# Patient Record
Sex: Male | Born: 1980 | Race: Black or African American | Hispanic: No | Marital: Married | Smoking: Current every day smoker
Health system: Southern US, Community
[De-identification: ages and names within clinical notes are randomized; demographics above are authoritative.]

## PROBLEM LIST (undated history)

## (undated) DIAGNOSIS — F1210 Cannabis abuse, uncomplicated: Secondary | ICD-10-CM

## (undated) DIAGNOSIS — R112 Nausea with vomiting, unspecified: Secondary | ICD-10-CM

## (undated) DIAGNOSIS — I2510 Atherosclerotic heart disease of native coronary artery without angina pectoris: Secondary | ICD-10-CM

## (undated) DIAGNOSIS — Z720 Tobacco use: Secondary | ICD-10-CM

## (undated) DIAGNOSIS — I255 Ischemic cardiomyopathy: Secondary | ICD-10-CM

## (undated) DIAGNOSIS — F1010 Alcohol abuse, uncomplicated: Secondary | ICD-10-CM

## (undated) HISTORY — DX: Atherosclerotic heart disease of native coronary artery without angina pectoris: I25.10

## (undated) HISTORY — DX: Ischemic cardiomyopathy: I25.5

---

## 2006-08-24 ENCOUNTER — Emergency Department: Payer: Self-pay | Admitting: Emergency Medicine

## 2014-04-06 ENCOUNTER — Emergency Department: Payer: Self-pay | Admitting: Emergency Medicine

## 2014-04-09 ENCOUNTER — Emergency Department: Payer: Self-pay | Admitting: Emergency Medicine

## 2014-12-23 ENCOUNTER — Encounter: Payer: Self-pay | Admitting: Emergency Medicine

## 2014-12-23 ENCOUNTER — Emergency Department
Admission: EM | Admit: 2014-12-23 | Discharge: 2014-12-23 | Disposition: A | Discharge status: Home / Self Care | Payer: Self-pay | Attending: Emergency Medicine | Admitting: Emergency Medicine

## 2014-12-23 ENCOUNTER — Emergency Department: Payer: Self-pay

## 2014-12-23 DIAGNOSIS — A084 Viral intestinal infection, unspecified: Secondary | ICD-10-CM | POA: Insufficient documentation

## 2014-12-23 LAB — CBC
HEMATOCRIT: 45.4 % (ref 40.0–52.0)
HEMOGLOBIN: 14.3 g/dL (ref 13.0–18.0)
MCH: 27.7 pg (ref 26.0–34.0)
MCHC: 31.5 g/dL — AB (ref 32.0–36.0)
MCV: 87.9 fL (ref 80.0–100.0)
Platelets: 216 10*3/uL (ref 150–440)
RBC: 5.16 MIL/uL (ref 4.40–5.90)
RDW: 14 % (ref 11.5–14.5)
WBC: 13.5 10*3/uL — AB (ref 3.8–10.6)

## 2014-12-23 LAB — COMPREHENSIVE METABOLIC PANEL
ALBUMIN: 4.2 g/dL (ref 3.5–5.0)
ALK PHOS: 57 U/L (ref 38–126)
ALT: 15 U/L — ABNORMAL LOW (ref 17–63)
ANION GAP: 4 — AB (ref 5–15)
AST: 18 U/L (ref 15–41)
BILIRUBIN TOTAL: 1 mg/dL (ref 0.3–1.2)
BUN: 12 mg/dL (ref 6–20)
CALCIUM: 9.2 mg/dL (ref 8.9–10.3)
CO2: 25 mmol/L (ref 22–32)
Chloride: 109 mmol/L (ref 101–111)
Creatinine, Ser: 0.94 mg/dL (ref 0.61–1.24)
GLUCOSE: 100 mg/dL — AB (ref 65–99)
POTASSIUM: 4.2 mmol/L (ref 3.5–5.1)
Sodium: 138 mmol/L (ref 135–145)
TOTAL PROTEIN: 7.7 g/dL (ref 6.5–8.1)

## 2014-12-23 LAB — URINALYSIS COMPLETE WITH MICROSCOPIC (ARMC ONLY)
Bacteria, UA: NONE SEEN
Bilirubin Urine: NEGATIVE
GLUCOSE, UA: NEGATIVE mg/dL
HGB URINE DIPSTICK: NEGATIVE
LEUKOCYTES UA: NEGATIVE
NITRITE: NEGATIVE
Protein, ur: NEGATIVE mg/dL
SPECIFIC GRAVITY, URINE: 1.023 (ref 1.005–1.030)
Squamous Epithelial / LPF: NONE SEEN
pH: 6 (ref 5.0–8.0)

## 2014-12-23 LAB — LIPASE, BLOOD: Lipase: 33 U/L (ref 11–51)

## 2014-12-23 MED ORDER — SODIUM CHLORIDE 0.9 % IV BOLUS (SEPSIS)
1000.0000 mL | Freq: Once | INTRAVENOUS | Status: AC
  Administered 2014-12-23: 1000 mL via INTRAVENOUS

## 2014-12-23 MED ORDER — IOHEXOL 350 MG/ML SOLN
80.0000 mL | Freq: Once | INTRAVENOUS | Status: AC | PRN
  Administered 2014-12-23: 80 mL via INTRAVENOUS
  Filled 2014-12-23: qty 80

## 2014-12-23 MED ORDER — ONDANSETRON HCL 4 MG PO TABS: 4.0000 mg | ORAL_TABLET | Freq: Three times a day (TID) | ORAL | Status: DC | PRN

## 2014-12-23 MED ORDER — HYDROMORPHONE HCL 1 MG/ML IJ SOLN
1.0000 mg | Freq: Once | INTRAMUSCULAR | Status: AC
  Administered 2014-12-23: 1 mg via INTRAVENOUS
  Filled 2014-12-23: qty 1

## 2014-12-23 MED ORDER — IOHEXOL 240 MG/ML SOLN
25.0000 mL | Freq: Once | INTRAMUSCULAR | Status: DC | PRN
  Filled 2014-12-23: qty 25

## 2014-12-23 MED ORDER — ONDANSETRON HCL 4 MG/2ML IJ SOLN
4.0000 mg | Freq: Once | INTRAMUSCULAR | Status: AC
  Administered 2014-12-23: 4 mg via INTRAVENOUS
  Filled 2014-12-23: qty 2

## 2014-12-23 NOTE — ED Notes (Signed)
Pt presents with vomiting for three days, unable to keep anything down.

## 2014-12-23 NOTE — ED Notes (Signed)
C/o abd pain and some vomiting

## 2014-12-23 NOTE — Discharge Instructions (Signed)

## 2014-12-23 NOTE — ED Provider Notes (Signed)
[facility] Emergency Department Provider Note  ____________________________________________  Time seen: Approximately 12:08 PM  I have reviewed the triage vital signs and the nursing notes.   HISTORY  Chief Complaint Emesis    HPI Dean Boyd is a 34 y.o. male patient complaining of nausea and vomiting for 3 days. Patient stated  Unable to tolerate food or fluids. Patient also states abdominal pain initially diffuse and is now localized to the right lower quadrant. Patient denies any fever but states he has chills. Patient describes his pain is crampy and rates it as 8/10. No palliative measures taken for this complaint.  History reviewed. No pertinent past medical history.  There are no active problems to display for this patient.   History reviewed. No pertinent past surgical history.  Current Outpatient Rx  Name  Route  Sig  Dispense  Refill  . ondansetron (ZOFRAN) 4 MG tablet   Oral   Take 1 tablet (4 mg total) by mouth every 8 (eight) hours as needed for nausea or vomiting.   15 tablet   0     Allergies Review of patient's allergies indicates no known allergies.  No family history on file.  Social History Social History  Substance Use Topics  . Smoking status: Never Smoker   . Smokeless tobacco: None  . Alcohol Use: No    Review of Systems Constitutional: No fever/chills Eyes: No visual changes. ENT: No sore throat. Cardiovascular: Denies chest pain. Respiratory: Denies shortness of breath. Gastrointestinal: Abdominal pain.  Nausea and vomiting.  No diarrhea.  No constipation. Genitourinary: Negative for dysuria. Musculoskeletal: Negative for back pain. Skin: Negative for rash. Neurological: Negative for headaches, focal weakness or numbness. 10-point ROS otherwise negative.  ____________________________________________   PHYSICAL EXAM:  VITAL SIGNS: ED Triage Vitals  Enc Vitals Group     BP 12/23/14 1041 134/89  mmHg     Pulse Rate 12/23/14 1041 75     Resp 12/23/14 1041 18     Temp 12/23/14 1041 98.3 F (36.8 C)     Temp Source 12/23/14 1041 Oral     SpO2 12/23/14 1041 97 %     Weight 12/23/14 1041 165 lb (74.844 kg)     Height 12/23/14 1041  (1.702 m)     Head Cir --      Peak Flow --      Pain Score 12/23/14 1050 8     Pain Loc --      Pain Edu? --      Excl. in GC? --     Constitutional: Alert and oriented. Moderate distress. Eyes: Conjunctivae are normal. PERRL. EOMI. Head: Atraumatic. Nose: No congestion/rhinnorhea. Mouth/Throat: Mucous membranes are moist.  Oropharynx non-erythematous. Neck: No stridor.  No cervical spine tenderness to palpation. Hematological/Lymphatic/Immunilogical: No cervical lymphadenopathy. Cardiovascular: Normal rate, regular rhythm. Grossly normal heart sounds.  Good peripheral circulation. Respiratory: Normal respiratory effort.  No retractions. Lungs CTAB. Gastrointestinal: Negative HSM. Hyperactive bowel sounds. Moderate guarding and rebound with palpation in the right lower quadrant. Musculoskeletal: No lower extremity tenderness nor edema.  No joint effusions. Neurologic:  Normal speech and language. No gross focal neurologic deficits are appreciated. No gait instability. Skin:  Skin is warm, dry and intact. No rash noted. Psychiatric: Mood and affect are normal. Speech and behavior are normal.  ____________________________________________   LABS (all labs ordered are listed, but only abnormal results are displayed)  Labs Reviewed  COMPREHENSIVE METABOLIC PANEL - Abnormal; Notable for the following:  Glucose, Bld 100 (*)    ALT 15 (*)    Anion gap 4 (*)    All other components within normal limits  CBC - Abnormal; Notable for the following:    WBC 13.5 (*)    MCHC 31.5 (*)    All other components within normal limits  URINALYSIS COMPLETEWITH MICROSCOPIC ([facility] ONLY) - Abnormal; Notable for the following:    Color, Urine YELLOW (*)     APPearance CLEAR (*)    Ketones, ur 1+ (*)    All other components within normal limits  LIPASE, BLOOD   ____________________________________________  EKG   ____________________________________________  RADIOLOGY  CT abdomen and pelvis with no acute findings. ____________________________________________   PROCEDURES  Procedure(s) performed: None  Critical Care performed: No  ____________________________________________   INITIAL IMPRESSION / ASSESSMENT AND PLAN / ED COURSE  Pertinent labs & imaging results that were available during my care of the patient were reviewed by me and considered in my medical decision making (see chart for details).  Viral gastroenteritis. Patient urine showed 1+ ketones were reviewed and hydrated with 1000 cc of normal saline. Patient was given Zofran IV. Patient will be discharged prescription for Zofran. Patient given a work note for 2 days. Patient advised [facility]. ____________________________________________   FINAL CLINICAL IMPRESSION(S) / ED DIAGNOSES  Final diagnoses:  Viral gastroenteritis      Joni ReiningRonald K Smith, PA-C 12/23/14 1423  Jennye MoccasinBrian S Quigley, MD 12/23/14 1451

## 2014-12-28 ENCOUNTER — Encounter: Payer: Self-pay | Admitting: Urgent Care

## 2014-12-28 ENCOUNTER — Emergency Department
Admission: EM | Admit: 2014-12-28 | Discharge: 2014-12-29 | Disposition: A | Discharge status: Home / Self Care | Payer: Self-pay | Attending: Emergency Medicine | Admitting: Emergency Medicine

## 2014-12-28 DIAGNOSIS — M545 Low back pain, unspecified: Secondary | ICD-10-CM

## 2014-12-28 DIAGNOSIS — F17200 Nicotine dependence, unspecified, uncomplicated: Secondary | ICD-10-CM | POA: Insufficient documentation

## 2014-12-28 DIAGNOSIS — K529 Noninfective gastroenteritis and colitis, unspecified: Secondary | ICD-10-CM | POA: Insufficient documentation

## 2014-12-28 LAB — COMPREHENSIVE METABOLIC PANEL
ALBUMIN: 4.8 g/dL (ref 3.5–5.0)
ALK PHOS: 58 U/L (ref 38–126)
ALT: 16 U/L — ABNORMAL LOW (ref 17–63)
AST: 20 U/L (ref 15–41)
Anion gap: 7 (ref 5–15)
BILIRUBIN TOTAL: 0.8 mg/dL (ref 0.3–1.2)
BUN: 12 mg/dL (ref 6–20)
CALCIUM: 9.3 mg/dL (ref 8.9–10.3)
CO2: 23 mmol/L (ref 22–32)
CREATININE: 1.08 mg/dL (ref 0.61–1.24)
Chloride: 107 mmol/L (ref 101–111)
GFR calc Af Amer: >60 mL/min (ref >60)
GLUCOSE: 101 mg/dL — AB (ref 65–99)
Potassium: 3.8 mmol/L (ref 3.5–5.1)
Sodium: 137 mmol/L (ref 135–145)
TOTAL PROTEIN: 8.5 g/dL — AB (ref 6.5–8.1)

## 2014-12-28 LAB — URINALYSIS COMPLETE WITH MICROSCOPIC (ARMC ONLY)
Bilirubin Urine: NEGATIVE
GLUCOSE, UA: NEGATIVE mg/dL
HGB URINE DIPSTICK: NEGATIVE
LEUKOCYTES UA: NEGATIVE
Nitrite: NEGATIVE
PH: 5 (ref 5.0–8.0)
PROTEIN: 30 mg/dL — AB
SQUAMOUS EPITHELIAL / LPF: NONE SEEN
Specific Gravity, Urine: 1.032 — ABNORMAL HIGH (ref 1.005–1.030)

## 2014-12-28 LAB — CBC
HEMATOCRIT: 45.2 % (ref 40.0–52.0)
Hemoglobin: 14.3 g/dL (ref 13.0–18.0)
MCH: 27.7 pg (ref 26.0–34.0)
MCHC: 31.6 g/dL — AB (ref 32.0–36.0)
MCV: 87.8 fL (ref 80.0–100.0)
PLATELETS: 213 10*3/uL (ref 150–440)
RBC: 5.15 MIL/uL (ref 4.40–5.90)
RDW: 13.8 % (ref 11.5–14.5)
WBC: 15.2 10*3/uL — AB (ref 3.8–10.6)

## 2014-12-28 LAB — LIPASE, BLOOD: Lipase: 35 U/L (ref 11–51)

## 2014-12-28 MED ORDER — MORPHINE SULFATE (PF) 4 MG/ML IV SOLN
4.0000 mg | Freq: Once | INTRAVENOUS | Status: AC
  Administered 2014-12-28: 4 mg via INTRAVENOUS
  Filled 2014-12-28: qty 1

## 2014-12-28 MED ORDER — SODIUM CHLORIDE 0.9 % IV BOLUS (SEPSIS)
1000.0000 mL | Freq: Once | INTRAVENOUS | Status: AC
  Administered 2014-12-28: 1000 mL via INTRAVENOUS

## 2014-12-28 MED ORDER — ONDANSETRON HCL 4 MG/2ML IJ SOLN
4.0000 mg | Freq: Once | INTRAMUSCULAR | Status: AC
  Administered 2014-12-28: 4 mg via INTRAVENOUS
  Filled 2014-12-28: qty 2

## 2014-12-28 NOTE — ED Provider Notes (Signed)
[facility] Emergency Department Provider Note  ____________________________________________  Time seen: 11:10 PM  I have reviewed the triage vital signs and the nursing notes.   HISTORY  Chief Complaint Emesis and Abdominal Pain      HPI Dean Boyd is a 34 y.o. male presents with 10 out of 10 left lower quadrant/left upper quadrant abdominal pain accompanied by nausea and vomiting with onset approximately 8 PM tonight. Patient states that he ate hot dogs at 3:00 in was able to tolerated then subsequently at 8:00 started having non-bloody emesis and diarrhea. Patient denies any fever. Of note patient was seen on 12/23/2014 for nausea vomiting and abdominal pain that had been persistent 3 days. At that time the patient had a CT scan of the abdomen and pelvis which revealed no gross abnormality, laboratory data revealed a leukocytosis of 13.     Past medical history None There are no active problems to display for this patient.   History reviewed. No pertinent past surgical history.  Current Outpatient Rx  Name  Route  Sig  Dispense  Refill  . ondansetron (ZOFRAN) 4 MG tablet   Oral   Take 1 tablet (4 mg total) by mouth every 8 (eight) hours as needed for nausea or vomiting.   15 tablet   0     Allergies No known drug allergies No family history on file.  Social History Social History  Substance Use Topics  . Smoking status: Current Every Day Smoker  . Smokeless tobacco: None  . Alcohol Use: No    Review of Systems  Constitutional: Negative for fever. Eyes: Negative for visual changes. ENT: Negative for sore throat. Cardiovascular: Negative for chest pain. Respiratory: Negative for shortness of breath. Gastrointestinal: Positive for abdominal pain vomiting and diarrhea Genitourinary: Negative for dysuria. Musculoskeletal: Negative for back pain. Skin: Negative for rash. Neurological: Negative for headaches, focal weakness or  numbness.  10-point ROS otherwise negative.  ____________________________________________   PHYSICAL EXAM:  VITAL SIGNS: ED Triage Vitals  Enc Vitals Group     BP 12/28/14 2102 122/89 mmHg     Pulse Rate 12/28/14 2102 78     Resp 12/28/14 2102 18     Temp 12/28/14 2102 98.4 F (36.9 C)     Temp Source 12/28/14 2102 Oral     SpO2 12/28/14 2102 97 %     Weight 12/28/14 2102 163 lb (73.936 kg)     Height 12/28/14 2102 5\' 7"  (1.702 m)     Head Cir --      Peak Flow --      Pain Score 12/28/14 2103 10     Pain Loc --      Pain Edu? --      Excl. in GC? --      Constitutional: Alert and oriented. Well appearing and in no distress. Eyes: Conjunctivae are normal. PERRL. Normal extraocular movements. ENT   Head: Normocephalic and atraumatic.   Nose: No congestion/rhinnorhea.   Mouth/Throat: Mucous membranes are moist.   Neck: No stridor. Hematological/Lymphatic/Immunilogical: No cervical lymphadenopathy. Cardiovascular: Normal rate, regular rhythm. Normal and symmetric distal pulses are present in all extremities. No murmurs, rubs, or gallops. Respiratory: Normal respiratory effort without tachypnea nor retractions. Breath sounds are clear and equal bilaterally. No wheezes/rales/rhonchi. Gastrointestinal: Left upper/left lower quadrant pain with palpation. No distention. There is no CVA tenderness. Genitourinary: deferred Musculoskeletal: Nontender with normal range of motion in all extremities. No joint effusions.  No lower extremity tenderness nor edema.  Neurologic:  Normal speech and language. No gross focal neurologic deficits are appreciated. Speech is normal.  Skin:  Skin is warm, dry and intact. No rash noted. Psychiatric: Mood and affect are normal. Speech and behavior are normal. Patient exhibits appropriate insight and judgment.  ____________________________________________    LABS (pertinent positives/negatives) Labs Reviewed  COMPREHENSIVE METABOLIC  PANEL - Abnormal; Notable for the following:    Glucose, Bld 101 (*)    Total Protein 8.5 (*)    ALT 16 (*)    All other components within normal limits  CBC - Abnormal; Notable for the following:    WBC 15.2 (*)    MCHC 31.6 (*)    All other components within normal limits  URINALYSIS COMPLETEWITH MICROSCOPIC ([facility] ONLY) - Abnormal; Notable for the following:    Color, Urine YELLOW (*)    APPearance CLEAR (*)    Ketones, ur TRACE (*)    Specific Gravity, Urine 1.032 (*)    Protein, ur 30 (*)    Bacteria, UA RARE (*)    All other components within normal limits  LIPASE, BLOOD      RADIOLOGY  CT Abdomen Pelvis W Contrast (Final result) Result time: 12/29/14 01:47:24   Final result by Rad Results In Interface (12/29/14 01:47:24)   Narrative:   CLINICAL DATA: Left upper and lower quadrant pain and vomiting for 1 week. Lumbar spine pain. Progressive symptoms.  EXAM: CT ABDOMEN AND PELVIS WITH CONTRAST  TECHNIQUE: Multidetector CT imaging of the abdomen and pelvis was performed using the standard protocol following bolus administration of intravenous contrast.  CONTRAST: OMNIPAQUE IOHEXOL 300 MG/ML SOLN  COMPARISON: CT 12/23/2014  FINDINGS: Lower chest: The included lung bases are clear. The heart is normal in size.  Liver: Minimal focal fatty infiltration adjacent with falciform ligament. No suspicious lesion.  Hepatobiliary: The gallbladder physiologically distended, no calcified stone. No biliary dilatation.  Pancreas: Normal. No ductal dilatation or surrounding inflammation.  Spleen: Normal.  Adrenal glands: No nodule.  Kidneys: Symmetric renal enhancement. No hydronephrosis. No perinephric stranding or urolithiasis.  Stomach/Bowel: Stomach physiologically distended. There are no dilated or thickened small bowel loops. The colon is decompressed. Equivocal colonic wall thickening throughout likely related to nondistention, less likely mild  colitis. No pericolonic inflammatory change. The appendix is normal.  Vascular/Lymphatic: No retroperitoneal adenopathy. Abdominal aorta is normal in caliber. There is atherosclerosis, unchanged from prior exam and advanced for age.  Reproductive: Normal sized prostate gland.  Bladder: Physiologically distended, no wall thickening.  Other: No free air, free fluid, or intra-abdominal fluid collection. No inguinal or abdominal wall hernia.  Musculoskeletal: There are no acute or suspicious osseous abnormalities. Particularly, normal appearance of the lumbar spine.  IMPRESSION: 1. Decompressed colon with equivocal diffuse wall thickening, versus nondistention. Nondistention is favored given the lack of adjacent inflammatory change, however mild colitis could have a similar appearance. 2. Otherwise no acute abnormality in the abdomen/pelvis. 3. Atherosclerosis again seen, advanced for age.   Electronically Signed By: Rubye Oaks M.D. On: 12/29/2014 01:47          INITIAL IMPRESSION / ASSESSMENT AND PLAN / ED COURSE  Pertinent labs & imaging results that were available during my care of the patient were reviewed by me and considered in my medical decision making (see chart for details).    ____________________________________________   FINAL CLINICAL IMPRESSION(S) / ED DIAGNOSES  Final diagnoses:  Colitis  Bilateral low back pain without sciatica      Darci Current, MD  12/29/14 0325 

## 2014-12-28 NOTE — ED Notes (Signed)
Patient presents with c/o LLQ abd pain with (+) N/V that has been going on for over a week. Patient was seen here last Tuesday for the same. Patient advising that he ate hotdogs around 1500 and kept them down until around 2000 when symptoms exacerbated. Denies fever and urinary symptoms. Patient reports that the pain in his abd has got "my whole back hurting".

## 2014-12-29 ENCOUNTER — Emergency Department: Payer: Self-pay

## 2014-12-29 MED ORDER — OXYCODONE-ACETAMINOPHEN 5-325 MG PO TABS
1.0000 | ORAL_TABLET | Freq: Once | ORAL | Status: AC
  Administered 2014-12-29: 1 via ORAL
  Filled 2014-12-29: qty 1

## 2014-12-29 MED ORDER — CYCLOBENZAPRINE HCL 10 MG PO TABS: 10.0000 mg | ORAL_TABLET | Freq: Three times a day (TID) | ORAL | Status: DC | PRN

## 2014-12-29 MED ORDER — IOHEXOL 240 MG/ML SOLN
25.0000 mL | Freq: Once | INTRAMUSCULAR | Status: AC | PRN
  Administered 2014-12-29: 25 mL via ORAL

## 2014-12-29 MED ORDER — IOHEXOL 300 MG/ML  SOLN
100.0000 mL | Freq: Once | INTRAMUSCULAR | Status: AC | PRN
  Administered 2014-12-29: 100 mL via INTRAVENOUS

## 2014-12-29 NOTE — Discharge Instructions (Signed)
Back Pain, Adult °Back pain is very common in adults. The cause of back pain is rarely dangerous and the pain often gets better over time. The cause of your back pain may not be known. Some common causes of back pain include: °· Strain of the muscles or ligaments supporting the spine. °· Wear and tear (degeneration) of the spinal disks. °· Arthritis. °· Direct injury to the back. °For many people, back pain may return. Since back pain is rarely dangerous, most people can learn to manage this condition on their own. °HOME CARE INSTRUCTIONS °Watch your back pain for any changes. The following actions may help to lessen any discomfort you are feeling: °· Remain active. It is stressful on your back to sit or stand in one place for long periods of time. Do not sit, drive, or stand in one place for more than 30 minutes at a time. Take short walks on even surfaces as soon as you are able. Try to increase the length of time you walk each day. °· Exercise regularly as directed by your health care provider. Exercise helps your back heal faster. It also helps avoid future injury by keeping your muscles strong and flexible. °· Do not stay in bed. Resting more than 1-2 days can delay your recovery. °· Pay attention to your body when you bend and lift. The most comfortable positions are those that put less stress on your recovering back. Always use proper lifting techniques, including: °· Bending your knees. °· Keeping the load close to your body. °· Avoiding twisting. °· Find a comfortable position to sleep. Use a firm mattress and lie on your side with your knees slightly bent. If you lie on your back, put a pillow under your knees. °· Avoid feeling anxious or stressed. Stress increases muscle tension and can worsen back pain. It is important to recognize when you are anxious or stressed and learn ways to manage it, such as with exercise. °· Take medicines only as directed by your health care provider. Over-the-counter  medicines to reduce pain and inflammation are often the most helpful. Your health care provider may prescribe muscle relaxant drugs. These medicines help dull your pain so you can more quickly return to your normal activities and healthy exercise. °· Apply ice to the injured area: °· Put ice in a plastic bag. °· Place a towel between your skin and the bag. °· Leave the ice on for 20 minutes, 2-3 times a day for the first 2-3 days. After that, ice and heat may be alternated to reduce pain and spasms. °· Maintain a healthy weight. Excess weight puts extra stress on your back and makes it difficult to maintain good posture. °SEEK MEDICAL CARE IF: °· You have pain that is not relieved with rest or medicine. °· You have increasing pain going down into the legs or buttocks. °· You have pain that does not improve in one week. °· You have night pain. °· You lose weight. °· You have a fever or chills. °SEEK IMMEDIATE MEDICAL CARE IF:  °· You develop new bowel or bladder control problems. °· You have unusual weakness or numbness in your arms or legs. °· You develop nausea or vomiting. °· You develop abdominal pain. °· You feel faint. °  °This information is not intended to replace advice given to you by your health care provider. Make sure you discuss any questions you have with your health care provider. °  °Document Released: 01/30/2005 Document Revised: 02/20/2014 Document Reviewed: 06/03/2013 °Elsevier Interactive Patient Education ©2016 Elsevier   Inc.  Colitis Colitis is inflammation of the colon. Colitis may last a short time (acute) or it may last a long time (chronic). CAUSES This condition may be caused by:  Viruses.  Bacteria.  Reactions to medicine.  Certain autoimmune diseases, such as Crohn disease or ulcerative colitis. SYMPTOMS Symptoms of this condition include:  Diarrhea.  Passing bloody or tarry stool.  Pain.  Fever.  Vomiting.  Tiredness (fatigue).  Weight  loss.  Bloating.  Sudden increase in abdominal pain.  Having fewer bowel movements than usual. DIAGNOSIS This condition is diagnosed with a stool test or a blood test. You may also have other tests, including X-rays, a CT scan, or a colonoscopy. TREATMENT Treatment may include:  Resting the bowel. This involves not eating or drinking for a period of time.  Fluids that are given through an IV tube.  Medicine for pain and diarrhea.  Antibiotic medicines.  Cortisone medicines.  Surgery. HOME CARE INSTRUCTIONS Eating and Drinking  Follow instructions from your health care provider about eating or drinking restrictions.  Drink enough fluid to keep your urine clear or pale yellow.  Work with a dietitian to determine which foods cause your condition to flare up.  Avoid foods that cause flare-ups.  Eat a well-balanced diet. Medicines  Take over-the-counter and prescription medicines only as told by your health care provider.  If you were prescribed an antibiotic medicine, take it as told by your health care provider. Do not stop taking the antibiotic even if you start to feel better. General Instructions  Keep all follow-up visits as told by your health care provider. This is important. SEEK MEDICAL CARE IF:  Your symptoms do not go away.  You develop new symptoms. SEEK IMMEDIATE MEDICAL CARE IF:  You have a fever that does not go away with treatment.  You develop chills.  You have extreme weakness, fainting, or dehydration.  You have repeated vomiting.  You develop severe pain in your abdomen.  You pass bloody or tarry stool.   This information is not intended to replace advice given to you by your health care provider. Make sure you discuss any questions you have with your health care provider.   Document Released: 03/09/2004 Document Revised: 10/21/2014 Document Reviewed: 05/25/2014 Elsevier Interactive Patient Education [employer]2016 Elsevier Inc.

## 2015-01-19 ENCOUNTER — Emergency Department: Payer: Self-pay

## 2015-01-19 ENCOUNTER — Encounter: Payer: Self-pay | Admitting: Emergency Medicine

## 2015-01-19 ENCOUNTER — Emergency Department
Admission: EM | Admit: 2015-01-19 | Discharge: 2015-01-19 | Disposition: A | Discharge status: Home / Self Care | Payer: Self-pay | Attending: Emergency Medicine | Admitting: Emergency Medicine

## 2015-01-19 DIAGNOSIS — S46911A Strain of unspecified muscle, fascia and tendon at shoulder and upper arm level, right arm, initial encounter: Secondary | ICD-10-CM | POA: Insufficient documentation

## 2015-01-19 DIAGNOSIS — Y998 Other external cause status: Secondary | ICD-10-CM | POA: Insufficient documentation

## 2015-01-19 DIAGNOSIS — F17210 Nicotine dependence, cigarettes, uncomplicated: Secondary | ICD-10-CM | POA: Insufficient documentation

## 2015-01-19 DIAGNOSIS — Y9389 Activity, other specified: Secondary | ICD-10-CM | POA: Insufficient documentation

## 2015-01-19 DIAGNOSIS — S161XXA Strain of muscle, fascia and tendon at neck level, initial encounter: Secondary | ICD-10-CM | POA: Insufficient documentation

## 2015-01-19 DIAGNOSIS — Y92410 Unspecified street and highway as the place of occurrence of the external cause: Secondary | ICD-10-CM | POA: Insufficient documentation

## 2015-01-19 MED ORDER — HYDROCODONE-ACETAMINOPHEN 5-325 MG PO TABS
2.0000 | ORAL_TABLET | Freq: Once | ORAL | Status: AC
  Administered 2015-01-19: 2 via ORAL
  Filled 2015-01-19: qty 2

## 2015-01-19 MED ORDER — KETOROLAC TROMETHAMINE 60 MG/2ML IM SOLN
60.0000 mg | Freq: Once | INTRAMUSCULAR | Status: AC
  Administered 2015-01-19: 60 mg via INTRAMUSCULAR
  Filled 2015-01-19: qty 2

## 2015-01-19 MED ORDER — BACLOFEN 10 MG PO TABS: 10.0000 mg | ORAL_TABLET | Freq: Three times a day (TID) | ORAL | Status: DC

## 2015-01-19 MED ORDER — IBUPROFEN 800 MG PO TABS: 800.0000 mg | ORAL_TABLET | Freq: Three times a day (TID) | ORAL | Status: DC | PRN
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
## 2015-01-19 NOTE — Discharge Instructions (Signed)
Cervical Sprain °A cervical sprain is an injury in the neck in which the strong, fibrous tissues (ligaments) that connect your neck bones stretch or tear. Cervical sprains can range from mild to severe. Severe cervical sprains can cause the neck vertebrae to be unstable. This can lead to damage of the spinal cord and can result in serious nervous system problems. The amount of time it takes for a cervical sprain to get better depends on the cause and extent of the injury. Most cervical sprains heal in 1 to 3 weeks. °CAUSES  °Severe cervical sprains may be caused by:  °· Contact sport injuries (such as from football, rugby, wrestling, hockey, auto racing, gymnastics, diving, martial arts, or boxing).   °· Motor vehicle collisions.   °· Whiplash injuries. This is an injury from a sudden forward and backward whipping movement of the head and neck.  °· Falls.   °Mild cervical sprains may be caused by:  °· Being in an awkward position, such as while cradling a telephone between your ear and shoulder.   °· Sitting in a chair that does not offer proper support.   °· Working at a poorly designed computer station.   °· Looking up or down for long periods of time.   °SYMPTOMS  °· Pain, soreness, stiffness, or a burning sensation in the front, back, or sides of the neck. This discomfort may develop immediately after the injury or slowly, 24 hours or more after the injury.   °· Pain or tenderness directly in the middle of the back of the neck.   °· Shoulder or upper back pain.   °· Limited ability to move the neck.   °· Headache.   °· Dizziness.   °· Weakness, numbness, or tingling in the hands or arms.   °· Muscle spasms.   °· Difficulty swallowing or chewing.   °· Tenderness and swelling of the neck.   °DIAGNOSIS  °Most of the time your health care provider can diagnose a cervical sprain by taking your history and doing a physical exam. Your health care provider will ask about previous neck injuries and any known neck  problems, such as arthritis in the neck. X-rays may be taken to find out if there are any other problems, such as with the bones of the neck. Other tests, such as a CT scan or MRI, may also be needed.  °TREATMENT  °Treatment depends on the severity of the cervical sprain. Mild sprains can be treated with rest, keeping the neck in place (immobilization), and pain medicines. Severe cervical sprains are immediately immobilized. Further treatment is done to help with pain, muscle spasms, and other symptoms and may include: °· Medicines, such as pain relievers, numbing medicines, or muscle relaxants.   °· Physical therapy. This may involve stretching exercises, strengthening exercises, and posture training. Exercises and improved posture can help stabilize the neck, strengthen muscles, and help stop symptoms from returning.   °HOME CARE INSTRUCTIONS  °· Put ice on the injured area.   °¨ Put ice in a plastic bag.   °¨ Place a towel between your skin and the bag.   °¨ Leave the ice on for 15-20 minutes, 3-4 times a day.   °· If your injury was severe, you may have been given a cervical collar to wear. A cervical collar is a two-piece collar designed to keep your neck from moving while it heals. °¨ Do not remove the collar unless instructed by your health care provider. °¨ If you have long hair, keep it outside of the collar. °¨ Ask your health care provider before making any adjustments to your collar. Minor   adjustments may be required over time to improve comfort and reduce pressure on your chin or on the back of your head.  Ifyou are allowed to remove the collar for cleaning or bathing, follow your health care provider's instructions on how to do so safely.  Keep your collar clean by wiping it with mild soap and water and drying it completely. If the collar you have been given includes removable pads, remove them every 1-2 days and hand wash them with soap and water. Allow them to air dry. They should be completely  dry before you wear them in the collar.  If you are allowed to remove the collar for cleaning and bathing, wash and dry the skin of your neck. Check your skin for irritation or sores. If you see any, tell your health care provider.  Do not drive while wearing the collar.   Only take over-the-counter or prescription medicines for pain, discomfort, or fever as directed by your health care provider.   Keep all follow-up appointments as directed by your health care provider.   Keep all physical therapy appointments as directed by your health care provider.   Make any needed adjustments to your workstation to promote good posture.   Avoid positions and activities that make your symptoms worse.   Warm up and stretch before being active to help prevent problems.  SEEK MEDICAL CARE IF:   Your pain is not controlled with medicine.   You are unable to decrease your pain medicine over time as planned.   Your activity level is not improving as expected.  SEEK IMMEDIATE MEDICAL CARE IF:   You develop any bleeding.  You develop stomach upset.  You have signs of an allergic reaction to your medicine.   Your symptoms get worse.   You develop new, unexplained symptoms.   You have numbness, tingling, weakness, or paralysis in any part of your body.  MAKE SURE YOU:   Understand these instructions.  Will watch your condition.  Will get help right away if you are not doing well or get worse.   This information is not intended to replace advice given to you by your health care provider. Make sure you discuss any questions you have with your health care provider.   Document Released: 11/27/2006 Document Revised: 02/04/2013 Document Reviewed: 08/07/2012 Elsevier Interactive Patient Education 2016 [employer]  Tourist information centre managerMotor Vehicle Collision After a car crash (motor vehicle collision), it is normal to have bruises and sore muscles. The first 24 hours usually feel the worst. After  that, you will likely start to feel better each day. HOME CARE  Put ice on the injured area.  Put ice in a plastic bag.  Place a towel between your skin and the bag.  Leave the ice on for 15-20 minutes, 03-04 times a day.  Drink enough fluids to keep your pee (urine) clear or pale yellow.  Do not drink alcohol.  Take a warm shower or bath 1 or 2 times a day. This helps your sore muscles.  Return to activities as told by your doctor. Be careful when lifting. Lifting can make neck or back pain worse.  Only take medicine as told by your doctor. Do not use aspirin. GET HELP RIGHT AWAY IF:   Your arms or legs tingle, feel weak, or lose feeling (numbness).  You have headaches that do not get better with medicine.  You have neck pain, especially in the middle of the back of your neck.  You cannot  control when you pee (urinate) or poop (bowel movement).  Pain is getting worse in any part of your body.  You are short of breath, dizzy, or pass out (faint).  You have chest pain.  You feel sick to your stomach (nauseous), throw up (vomit), or sweat.  You have belly (abdominal) pain that gets worse.  There is blood in your pee, poop, or throw up.  You have pain in your shoulder (shoulder strap areas).  Your problems are getting worse. MAKE SURE YOU:   Understand these instructions.  Will watch your condition.  Will get help right away if you are not doing well or get worse.   This information is not intended to replace advice given to you by your health care provider. Make sure you discuss any questions you have with your health care provider.   Document Released: 07/19/2007 Document Revised: 04/24/2011 Document Reviewed: 06/29/2010 Elsevier Interactive Patient Education 2016 Elsevier Inc.  Muscle Strain A muscle strain is an injury that occurs when a muscle is stretched beyond its normal length. Usually a small number of muscle fibers are torn when this happens. Muscle  strain is rated in degrees. First-degree strains have the least amount of muscle fiber tearing and pain. Second-degree and third-degree strains have increasingly more tearing and pain.  Usually, recovery from muscle strain takes 1-2 weeks. Complete healing takes 5-6 weeks.  CAUSES  Muscle strain happens when a sudden, violent force placed on a muscle stretches it too far. This may occur with lifting, sports, or a fall.  RISK FACTORS Muscle strain is especially common in athletes.  SIGNS AND SYMPTOMS At the site of the muscle strain, there may be:  Pain.  Bruising.  Swelling.  Difficulty using the muscle due to pain or lack of normal function. DIAGNOSIS  Your health care provider will perform a physical exam and ask about your medical history. TREATMENT  Often, the best treatment for a muscle strain is resting, icing, and applying cold compresses to the injured area.  HOME CARE INSTRUCTIONS   Use the PRICE method of treatment to promote muscle healing during the first 2-3 days after your injury. The PRICE method involves:  Protecting the muscle from being injured again.  Restricting your activity and resting the injured body part.  Icing your injury. To do this, put ice in a plastic bag. Place a towel between your skin and the bag. Then, apply the ice and leave it on from 15-20 minutes each hour. After the third day, switch to moist heat packs.  Apply compression to the injured area with a splint or elastic bandage. Be careful not to wrap it too tightly. This may interfere with blood circulation or increase swelling.  Elevate the injured body part above the level of your heart as often as you can.  Only take over-the-counter or prescription medicines for pain, discomfort, or fever as directed by your health care provider.  Warming up prior to exercise helps to prevent future muscle strains. SEEK MEDICAL CARE IF:   You have increasing pain or swelling in the injured area.  You  have numbness, tingling, or a significant loss of strength in the injured area. MAKE SURE YOU:   Understand these instructions.  Will watch your condition.  Will get help right away if you are not doing well or get worse.   This information is not intended to replace advice given to you by your health care provider. Make sure you discuss any questions you have  with your health care provider.   Document Released: 01/30/2005 Document Revised: 11/20/2012 Document Reviewed: 08/29/2012 Elsevier Interactive Patient Education [employer].

## 2015-08-05 ENCOUNTER — Encounter: Payer: Self-pay | Admitting: Emergency Medicine

## 2015-08-05 ENCOUNTER — Emergency Department
Admission: EM | Admit: 2015-08-05 | Discharge: 2015-08-05 | Disposition: A | Discharge status: Home / Self Care | Payer: No Typology Code available for payment source | Attending: Emergency Medicine | Admitting: Emergency Medicine

## 2015-08-05 DIAGNOSIS — F17210 Nicotine dependence, cigarettes, uncomplicated: Secondary | ICD-10-CM | POA: Insufficient documentation

## 2015-08-05 DIAGNOSIS — Z791 Long term (current) use of non-steroidal anti-inflammatories (NSAID): Secondary | ICD-10-CM | POA: Insufficient documentation

## 2015-08-05 DIAGNOSIS — S39012A Strain of muscle, fascia and tendon of lower back, initial encounter: Secondary | ICD-10-CM | POA: Insufficient documentation

## 2015-08-05 DIAGNOSIS — Y9389 Activity, other specified: Secondary | ICD-10-CM | POA: Insufficient documentation

## 2015-08-05 DIAGNOSIS — Y92009 Unspecified place in unspecified non-institutional (private) residence as the place of occurrence of the external cause: Secondary | ICD-10-CM | POA: Insufficient documentation

## 2015-08-05 DIAGNOSIS — Y999 Unspecified external cause status: Secondary | ICD-10-CM | POA: Insufficient documentation

## 2015-08-05 DIAGNOSIS — X500XXA Overexertion from strenuous movement or load, initial encounter: Secondary | ICD-10-CM | POA: Insufficient documentation

## 2015-08-05 MED ORDER — METHOCARBAMOL 750 MG PO TABS: 750.0000 mg | ORAL_TABLET | Freq: Four times a day (QID) | ORAL | Status: DC

## 2015-08-05 MED ORDER — IBUPROFEN 50 MG PO CHEW: 50.0000 mg | CHEWABLE_TABLET | Freq: Three times a day (TID) | ORAL | Status: DC | PRN

## 2015-08-05 MED ORDER — OXYCODONE-ACETAMINOPHEN 5-325 MG PO TABS: 1.0000 | ORAL_TABLET | Freq: Four times a day (QID) | ORAL | Status: DC | PRN

## 2015-08-05 NOTE — ED Provider Notes (Signed)
[facility] Emergency Department Provider Note   ____________________________________________  Time seen: Approximately 6:03 PM  I have reviewed the triage vital signs and the nursing notes.   HISTORY  Chief Complaint Back Pain    HPI Dean Boyd is a 35 y.o. male patient complaining of low back pain secondary to lifting incident.Patient state he was lifting some bricks at home earlier today. Patient state felt "Catch" in his left lower back. Instead occurred approximately 5 hours ago. Patient stated pain is increase and not relieved with taking ibuprofen. Patient denies any radicular component to this pain. He denies any bladder or bowel dysfunction. No other palliative measures for this complaint. Patient rates his pain as a 9/10. Patient described the pain as sharp and intermittent spasms.   No past medical history on file.  There are no active problems to display for this patient.   History reviewed. No pertinent past surgical history.  Current Outpatient Rx  Name  Route  Sig  Dispense  Refill  . baclofen (LIORESAL) 10 MG tablet   Oral   Take 1 tablet (10 mg total) by mouth 3 (three) times daily.   30 tablet   0   . ibuprofen (ADVIL,MOTRIN) 50 MG chewable tablet   Oral   Chew 1 tablet (50 mg total) by mouth every 8 (eight) hours as needed for fever.   24 tablet   2   . ibuprofen (ADVIL,MOTRIN) 800 MG tablet   Oral   Take 1 tablet (800 mg total) by mouth every 8 (eight) hours as needed.   30 tablet   0   . methocarbamol (ROBAXIN-750) 750 MG tablet   Oral   Take 1 tablet (750 mg total) by mouth 4 (four) times daily.   20 tablet   0   . oxyCODONE-acetaminophen (ROXICET) 5-325 MG tablet   Oral   Take 1 tablet by mouth every 6 (six) hours as needed for moderate pain.   12 tablet   0     Allergies Review of patient's allergies indicates no known allergies.  No family history on file.  Social History Social History    Substance Use Topics  . Smoking status: Current Every Day Smoker -- 0.50 packs/day    Types: Cigarettes  . Smokeless tobacco: None  . Alcohol Use: No    Review of Systems Constitutional: No fever/chills Eyes: No visual changes. ENT: No sore throat. Cardiovascular: Denies chest pain. Respiratory: Denies shortness of breath. Gastrointestinal: No abdominal pain.  No nausea, no vomiting.  No diarrhea.  No constipation. Genitourinary: Negative for dysuria. Musculoskeletal: Positive for back pain. Skin: Negative for rash. Neurological: Negative for headaches, focal weakness or numbness.    ____________________________________________   PHYSICAL EXAM:  VITAL SIGNS: ED Triage Vitals  Enc Vitals Group     BP 08/05/15 1736 115/71 mmHg     Pulse Rate 08/05/15 1736 80     Resp 08/05/15 1736 18     Temp 08/05/15 1736 98.1 F (36.7 C)     Temp Source 08/05/15 1736 Oral     SpO2 08/05/15 1736 97 %     Weight 08/05/15 1735 153 lb (69.4 kg)     Height 08/05/15 1735 5\' 7"  (1.702 m)     Head Cir --      Peak Flow --      Pain Score 08/05/15 1736 9     Pain Loc --      Pain Edu? --  Excl. in GC? --     Constitutional: Alert and oriented. Well appearing and in no acute distress. Eyes: Conjunctivae are normal. PERRL. EOMI. Head: Atraumatic. Nose: No congestion/rhinnorhea. Mouth/Throat: Mucous membranes are moist.  Oropharynx non-erythematous. Neck: No stridor.  No cervical spine tenderness to palpation. Hematological/Lymphatic/Immunilogical: No cervical lymphadenopathy. Cardiovascular: Normal rate, regular rhythm. Grossly normal heart sounds.  Good peripheral circulation. Respiratory: Normal respiratory effort.  No retractions. Lungs CTAB. Gastrointestinal: Soft and nontender. No distention. No abdominal bruits. No CVA tenderness. Musculoskeletal:No obvious spinal deformity. Patient has heavy reliance on upper extremity moving from sitting to standing position. Patient leans  to the right when standing. Patient has negative straight leg test. When patient comes to the right position left paraspinal muscle spasms are elicited. Neurologic:  Normal speech and language. No gross focal neurologic deficits are appreciated. No gait instability. Skin:  Skin is warm, dry and intact. No rash noted. Psychiatric: Mood and affect are normal. Speech and behavior are normal.  ____________________________________________   LABS (all labs ordered are listed, but only abnormal results are displayed)  Labs Reviewed - No data to display ____________________________________________  EKG   ____________________________________________  RADIOLOGY   ____________________________________________   PROCEDURES  Procedure(s) performed: None  Critical Care performed: No  ____________________________________________   INITIAL IMPRESSION / ASSESSMENT AND PLAN / ED COURSE  Pertinent labs & imaging results that were available during my care of the patient were reviewed by me and considered in my medical decision making (see chart for details).  Acute left lumbar strain. Patient given discharge care instructions. Patient given a prescription for Percocet, Robaxin, and ibuprofen. Patient [facility]. ____________________________________________   FINAL CLINICAL IMPRESSION(S) / ED DIAGNOSES  Final diagnoses:  Lumbar strain, initial encounter      NEW MEDICATIONS STARTED DURING THIS VISIT:  New Prescriptions   IBUPROFEN (ADVIL,MOTRIN) 50 MG CHEWABLE TABLET    Chew 1 tablet (50 mg total) by mouth every 8 (eight) hours as needed for fever.   METHOCARBAMOL (ROBAXIN-750) 750 MG TABLET    Take 1 tablet (750 mg total) by mouth 4 (four) times daily.   OXYCODONE-ACETAMINOPHEN (ROXICET) 5-325 MG TABLET    Take 1 tablet by mouth every 6 (six) hours as needed for moderate pain.     Note:  This document was prepared using  Dragon voice recognition software and may include unintentional dictation errors.    Joni Reiningonald K Ayanah Snader, PA-C 08/05/15 1813  Jeanmarie PlantJames A McShane, MD 08/05/15 2018

## 2015-08-05 NOTE — ED Notes (Signed)
Injured back while picking up some bricks at home.  C/O left lower back pain.

## 2015-09-20 ENCOUNTER — Encounter: Payer: Self-pay | Admitting: Emergency Medicine

## 2015-09-20 ENCOUNTER — Emergency Department
Admission: EM | Admit: 2015-09-20 | Discharge: 2015-09-20 | Disposition: A | Discharge status: Home / Self Care | Payer: Self-pay | Attending: Student in an Organized Health Care Education/Training Program | Admitting: Student in an Organized Health Care Education/Training Program

## 2015-09-20 DIAGNOSIS — Z79899 Other long term (current) drug therapy: Secondary | ICD-10-CM | POA: Insufficient documentation

## 2015-09-20 DIAGNOSIS — Y929 Unspecified place or not applicable: Secondary | ICD-10-CM | POA: Insufficient documentation

## 2015-09-20 DIAGNOSIS — Y939 Activity, unspecified: Secondary | ICD-10-CM | POA: Insufficient documentation

## 2015-09-20 DIAGNOSIS — S39012A Strain of muscle, fascia and tendon of lower back, initial encounter: Secondary | ICD-10-CM | POA: Insufficient documentation

## 2015-09-20 DIAGNOSIS — F17210 Nicotine dependence, cigarettes, uncomplicated: Secondary | ICD-10-CM | POA: Insufficient documentation

## 2015-09-20 DIAGNOSIS — X500XXA Overexertion from strenuous movement or load, initial encounter: Secondary | ICD-10-CM | POA: Insufficient documentation

## 2015-09-20 DIAGNOSIS — Y990 Civilian activity done for income or pay: Secondary | ICD-10-CM | POA: Insufficient documentation

## 2015-09-20 LAB — URINALYSIS COMPLETE WITH MICROSCOPIC (ARMC ONLY)
Bacteria, UA: NONE SEEN
Bilirubin Urine: NEGATIVE
Glucose, UA: NEGATIVE mg/dL
Hgb urine dipstick: NEGATIVE
KETONES UR: NEGATIVE mg/dL
Leukocytes, UA: NEGATIVE
Nitrite: NEGATIVE
PROTEIN: NEGATIVE mg/dL
Specific Gravity, Urine: 1.024 (ref 1.005–1.030)
pH: 6 (ref 5.0–8.0)

## 2015-09-20 MED ORDER — IBUPROFEN 600 MG PO TABS: 600.0000 mg | ORAL_TABLET | Freq: Three times a day (TID) | ORAL | 0 refills | Status: DC | PRN

## 2015-09-20 MED ORDER — METHOCARBAMOL 500 MG PO TABS
1000.0000 mg | ORAL_TABLET | Freq: Once | ORAL | Status: AC
  Administered 2015-09-20: 1000 mg via ORAL
  Filled 2015-09-20: qty 2

## 2015-09-20 MED ORDER — METHOCARBAMOL 750 MG PO TABS
750.0000 mg | ORAL_TABLET | Freq: Four times a day (QID) | ORAL | 0 refills | Status: DC
Start: 2015-09-20 — End: 2015-11-30

## 2015-09-20 MED ORDER — TRAMADOL HCL 50 MG PO TABS: 50.0000 mg | ORAL_TABLET | Freq: Four times a day (QID) | ORAL | 0 refills | Status: DC | PRN

## 2015-09-20 MED ORDER — OXYCODONE-ACETAMINOPHEN 5-325 MG PO TABS
1.0000 | ORAL_TABLET | Freq: Once | ORAL | Status: AC
Start: 2015-09-20 — End: 2015-09-20
  Administered 2015-09-20: 1 via ORAL
  Filled 2015-09-20: qty 1

## 2015-09-20 NOTE — ED Provider Notes (Signed)
[facility] Emergency Department Provider Note   ____________________________________________   First MD Initiated Contact with Patient 09/20/15 1708     (approximate)  I have reviewed the triage vital signs and the nursing notes.   HISTORY  Chief Complaint Back Pain    HPI Dean Boyd is a 35 y.o. male patient complaining of low back pain after lifting a heavy box of frozen food at work. Patient isn't occurred one half weeks ago. Patient state in the last 2 days has increasing left flank pain. Patient denies any dysuria or hematuria. Patient said he had the same problem in the same area a month and half ago. Patient" not relieved with over-the-counter Tylenol. She denies any radicular component to this pain he denies any bladder or bowel dysfunction. Patient rates the pain as a 10 over 10. Patient described a pain as sharp.   History reviewed. No pertinent past medical history.  There are no active problems to display for this patient.   History reviewed. No pertinent surgical history.  Prior to Admission medications   Medication Sig Start Date End Date Taking? Authorizing Provider  baclofen (LIORESAL) 10 MG tablet Take 1 tablet (10 mg total) by mouth 3 (three) times daily. 01/19/15   Charmayne Sheer Beers, PA-C  ibuprofen (ADVIL,MOTRIN) 50 MG chewable tablet Chew 1 tablet (50 mg total) by mouth every 8 (eight) hours as needed for fever. 08/05/15 08/04/16  Joni Reining, PA-C  ibuprofen (ADVIL,MOTRIN) 800 MG tablet Take 1 tablet (800 mg total) by mouth every 8 (eight) hours as needed. 01/19/15   Evangeline Dakin, PA-C  methocarbamol (ROBAXIN-750) 750 MG tablet Take 1 tablet (750 mg total) by mouth 4 (four) times daily. 08/05/15   Joni Reining, PA-C  oxyCODONE-acetaminophen (ROXICET) 5-325 MG tablet Take 1 tablet by mouth every 6 (six) hours as needed for moderate pain. 08/05/15   Joni Reining, PA-C    Allergies Review of patient's allergies indicates no  known allergies.  No family history on file.  Social History Social History  Substance Use Topics  . Smoking status: Current Every Day Smoker    Packs/day: 0.50    Types: Cigarettes  . Smokeless tobacco: Never Used  . Alcohol use No    Review of Systems Constitutional: No fever/chills Eyes: No visual changes. ENT: No sore throat. Cardiovascular: Denies chest pain. Respiratory: Denies shortness of breath. Gastrointestinal: No abdominal pain.  No nausea, no vomiting.  No diarrhea.  No constipation. Genitourinary: Negative for dysuria. Musculoskeletal:Positive for back pain. Skin: Negative for rash. Neurological: Negative for headaches, focal weakness or numbness.  .  ____________________________________________   PHYSICAL EXAM:  VITAL SIGNS: ED Triage Vitals  Enc Vitals Group     BP 09/20/15 1646 116/75     Pulse Rate 09/20/15 1646 82     Resp 09/20/15 1646 17     Temp 09/20/15 1646 98.9 F (37.2 C)     Temp Source 09/20/15 1646 Oral     SpO2 09/20/15 1646 97 %     Weight 09/20/15 1646 160 lb (72.6 kg)     Height 09/20/15 1646  (1.702 m)     Head Circumference --      Peak Flow --      Pain Score 09/20/15 1652 10     Pain Loc --      Pain Edu? --      Excl. in GC? --     Constitutional: Alert and oriented. Well appearing  and in no acute distress. Eyes: Conjunctivae are normal. PERRL. EOMI. Head: Atraumatic. Nose: No congestion/rhinnorhea. Mouth/Throat: Mucous membranes are moist.  Oropharynx non-erythematous. Neck: No stridor.  No cervical spine tenderness to palpation. Hematological/Lymphatic/Immunilogical: No cervical lymphadenopathy. Cardiovascular: Normal rate, regular rhythm. Grossly normal heart sounds.  Good peripheral circulation. Respiratory: Normal respiratory effort.  No retractions. Lungs CTAB. Gastrointestinal: Soft and nontender. No distention. No abdominal bruits. No CVA tenderness. Musculoskeletal: No obvious spinal deformity. There is  no guarding with palpation spinal process. Patient has a per spinal muscle spasm with right lateral movements. Patient also has a remarkable guarding with the right CVA area.  Neurologic:  Normal speech and language. No gross focal neurologic deficits are appreciated. No gait instability. Skin:  Skin is warm, dry and intact. No rash noted. Psychiatric: Mood and affect are normal. Speech and behavior are normal.  ____________________________________________   LABS (all labs ordered are listed, but only abnormal results are displayed)  Labs Reviewed  URINALYSIS COMPLETEWITH MICROSCOPIC ([facility] ONLY) - Abnormal; Notable for the following:       Result Value   Color, Urine YELLOW (*)    APPearance CLEAR (*)    Squamous Epithelial / LPF 0-5 (*)    All other components within normal limits   ____________________________________________  EKG   ____________________________________________  RADIOLOGY   ____________________________________________   PROCEDURES  Procedure(s) performed: None  Procedures  Critical Care performed: No  ____________________________________________   INITIAL IMPRESSION / ASSESSMENT AND PLAN / ED COURSE  Pertinent labs & imaging results that were available during my care of the patient were reviewed by me and considered in my medical decision making (see chart for details).  Acute lumbar strain. Patient given discharge Instructions. Patient given a prescription for tramadol, Robaxin, and ibuprofen. Patient given a work note for 2 days. [facility]  Clinical Course     ____________________________________________   FINAL CLINICAL IMPRESSION(S) / ED DIAGNOSES  Final diagnoses:  Low back strain, initial encounter      NEW MEDICATIONS STARTED DURING THIS VISIT:  Current Discharge Medication List       Note:  This document was prepared using Dragon voice recognition software and may include  unintentional dictation errors.    Joni Reiningonald K Smith, PA-C 09/20/15 1812    Willy EddyPatrick Robinson, MD 09/20/15 [phone]

## 2015-09-20 NOTE — ED Notes (Signed)
Pt complains of left sided low back pain for 1 week after lifting a box, pt denies any other symptoms

## 2015-09-20 NOTE — ED Triage Notes (Signed)
Having lower back pain after lifting a box    States has been seen for same recently

## 2015-11-28 ENCOUNTER — Emergency Department: Payer: Self-pay

## 2015-11-28 ENCOUNTER — Emergency Department
Admission: EM | Admit: 2015-11-28 | Discharge: 2015-11-28 | Disposition: A | Discharge status: Home / Self Care | Payer: Self-pay | Attending: Emergency Medicine | Admitting: Emergency Medicine

## 2015-11-28 DIAGNOSIS — F17210 Nicotine dependence, cigarettes, uncomplicated: Secondary | ICD-10-CM | POA: Insufficient documentation

## 2015-11-28 DIAGNOSIS — Z5321 Procedure and treatment not carried out due to patient leaving prior to being seen by health care provider: Secondary | ICD-10-CM | POA: Insufficient documentation

## 2015-11-28 DIAGNOSIS — R0602 Shortness of breath: Secondary | ICD-10-CM | POA: Insufficient documentation

## 2015-11-28 LAB — BASIC METABOLIC PANEL
Anion gap: 8 (ref 5–15)
BUN: 14 mg/dL (ref 6–20)
CO2: 21 mmol/L — ABNORMAL LOW (ref 22–32)
CREATININE: 0.97 mg/dL (ref 0.61–1.24)
Calcium: 8.9 mg/dL (ref 8.9–10.3)
Chloride: 108 mmol/L (ref 101–111)
Glucose, Bld: 113 mg/dL — ABNORMAL HIGH (ref 65–99)
POTASSIUM: 4.1 mmol/L (ref 3.5–5.1)
SODIUM: 137 mmol/L (ref 135–145)

## 2015-11-28 LAB — CBC
HEMATOCRIT: 46.5 % (ref 40.0–52.0)
Hemoglobin: 15 g/dL (ref 13.0–18.0)
MCH: 29 pg (ref 26.0–34.0)
MCHC: 32.3 g/dL (ref 32.0–36.0)
MCV: 90 fL (ref 80.0–100.0)
PLATELETS: 223 10*3/uL (ref 150–440)
RBC: 5.17 MIL/uL (ref 4.40–5.90)
RDW: 13.8 % (ref 11.5–14.5)
WBC: 14.8 10*3/uL — AB (ref 3.8–10.6)

## 2015-11-28 LAB — TROPONIN I: Troponin I: <0.03 ng/mL (ref <0.03)

## 2015-11-28 NOTE — ED Triage Notes (Signed)
Pt c/o chest pain that radiates into the back with SOb since this morning.. Denies any medical hx..Dean Boyd

## 2015-11-29 ENCOUNTER — Telehealth: Payer: Self-pay | Admitting: Emergency Medicine

## 2015-11-29 NOTE — Telephone Encounter (Signed)
Called patient due to lwot to inquire about condition and follow up plans. Left message asking hm to call me.

## 2015-11-30 ENCOUNTER — Emergency Department: Payer: Self-pay

## 2015-11-30 ENCOUNTER — Emergency Department
Admission: EM | Admit: 2015-11-30 | Discharge: 2015-11-30 | Disposition: A | Discharge status: Home / Self Care | Payer: Self-pay | Attending: Emergency Medicine | Admitting: Emergency Medicine

## 2015-11-30 ENCOUNTER — Encounter: Payer: Self-pay | Admitting: Emergency Medicine

## 2015-11-30 DIAGNOSIS — F17210 Nicotine dependence, cigarettes, uncomplicated: Secondary | ICD-10-CM | POA: Insufficient documentation

## 2015-11-30 DIAGNOSIS — R0602 Shortness of breath: Secondary | ICD-10-CM | POA: Insufficient documentation

## 2015-11-30 DIAGNOSIS — R112 Nausea with vomiting, unspecified: Secondary | ICD-10-CM | POA: Insufficient documentation

## 2015-11-30 DIAGNOSIS — R079 Chest pain, unspecified: Secondary | ICD-10-CM | POA: Insufficient documentation

## 2015-11-30 DIAGNOSIS — R1084 Generalized abdominal pain: Secondary | ICD-10-CM | POA: Insufficient documentation

## 2015-11-30 LAB — CBC
HEMATOCRIT: 48.4 % (ref 40.0–52.0)
HEMOGLOBIN: 16.3 g/dL (ref 13.0–18.0)
MCH: 29.5 pg (ref 26.0–34.0)
MCHC: 33.8 g/dL (ref 32.0–36.0)
MCV: 87.4 fL (ref 80.0–100.0)
Platelets: 239 10*3/uL (ref 150–440)
RBC: 5.53 MIL/uL (ref 4.40–5.90)
RDW: 13.9 % (ref 11.5–14.5)
WBC: 18.9 10*3/uL — ABNORMAL HIGH (ref 3.8–10.6)

## 2015-11-30 LAB — COMPREHENSIVE METABOLIC PANEL
ALBUMIN: 4.4 g/dL (ref 3.5–5.0)
ALK PHOS: 48 U/L (ref 38–126)
ALT: 20 U/L (ref 17–63)
ANION GAP: 15 (ref 5–15)
AST: 29 U/L (ref 15–41)
BUN: 11 mg/dL (ref 6–20)
CALCIUM: 9.9 mg/dL (ref 8.9–10.3)
CO2: 20 mmol/L — AB (ref 22–32)
Chloride: 104 mmol/L (ref 101–111)
Creatinine, Ser: 1.25 mg/dL — ABNORMAL HIGH (ref 0.61–1.24)
GFR calc non Af Amer: >60 mL/min (ref >60)
Glucose, Bld: 137 mg/dL — ABNORMAL HIGH (ref 65–99)
POTASSIUM: 3.2 mmol/L — AB (ref 3.5–5.1)
SODIUM: 139 mmol/L (ref 135–145)
Total Bilirubin: 1.5 mg/dL — ABNORMAL HIGH (ref 0.3–1.2)
Total Protein: 8.2 g/dL — ABNORMAL HIGH (ref 6.5–8.1)

## 2015-11-30 LAB — TROPONIN I

## 2015-11-30 LAB — LIPASE, BLOOD: LIPASE: 58 U/L — AB (ref 11–51)

## 2015-11-30 MED ORDER — METOCLOPRAMIDE HCL 10 MG PO TABS: 10.0000 mg | ORAL_TABLET | Freq: Four times a day (QID) | ORAL | 0 refills | Status: DC | PRN

## 2015-11-30 MED ORDER — ONDANSETRON HCL 4 MG/2ML IJ SOLN
4.0000 mg | Freq: Once | INTRAMUSCULAR | Status: AC | PRN
  Administered 2015-11-30: 4 mg via INTRAVENOUS
  Filled 2015-11-30: qty 2

## 2015-11-30 MED ORDER — SODIUM CHLORIDE 0.9 % IV BOLUS (SEPSIS)
1000.0000 mL | Freq: Once | INTRAVENOUS | Status: AC
  Administered 2015-11-30: 1000 mL via INTRAVENOUS

## 2015-11-30 MED ORDER — ONDANSETRON HCL 4 MG/2ML IJ SOLN
4.0000 mg | Freq: Once | INTRAMUSCULAR | Status: AC
  Administered 2015-11-30: 4 mg via INTRAVENOUS
  Filled 2015-11-30: qty 2

## 2015-11-30 MED ORDER — IOPAMIDOL (ISOVUE-300) INJECTION 61%
30.0000 mL | Freq: Once | INTRAVENOUS | Status: AC | PRN
  Administered 2015-11-30: 30 mL via ORAL

## 2015-11-30 MED ORDER — FAMOTIDINE 40 MG PO TABS: 40.0000 mg | ORAL_TABLET | Freq: Every evening | ORAL | 0 refills | Status: DC

## 2015-11-30 MED ORDER — MORPHINE SULFATE (PF) 4 MG/ML IV SOLN: 4.0000 mg | Freq: Once | INTRAVENOUS | Status: DC

## 2015-11-30 MED ORDER — IOPAMIDOL (ISOVUE-300) INJECTION 61%
100.0000 mL | Freq: Once | INTRAVENOUS | Status: AC | PRN
  Administered 2015-11-30: 100 mL via INTRAVENOUS

## 2015-11-30 MED ORDER — MORPHINE SULFATE (PF) 2 MG/ML IV SOLN
INTRAVENOUS | Status: AC
  Administered 2015-11-30: 4 mg via INTRAVENOUS
  Filled 2015-11-30: qty 2

## 2015-11-30 MED ORDER — DICYCLOMINE HCL 20 MG PO TABS: 20.0000 mg | ORAL_TABLET | Freq: Three times a day (TID) | ORAL | 0 refills | Status: DC | PRN

## 2015-11-30 NOTE — ED Triage Notes (Signed)
Pt  to ed with c/o sob, chest pain and vomiting since Sunday.  Pt currently with emesis at triage.

## 2015-11-30 NOTE — ED Provider Notes (Signed)
[facility] Emergency Department Provider Note  ____________________________________________   First MD Initiated Contact with Patient 11/30/15 [phone]     (approximate)  I have reviewed the triage vital signs and the nursing notes.   HISTORY  Chief Complaint Emesis   HPI Dean Boyd is a 35 y.o. male without any chronic medical conditions was presenting to the emergency department today with diffuse abdominal pain as well as left-sided chest pain. He is also complaining of shortness of breath, nausea and vomiting but no diarrhea. He says that the pain is cramping and a 10 out of 10. He says that it is worse with vomiting. He says that he has been retching at home and unable to eat anything. Denies any known sick contacts. Denies any heavy drinking or drug use. Signed in 2 days ago to the emergency department but left without being seen because of a long wait in the waiting room. He did have labs and a chest x-ray done at that time. Says that the symptoms are similar but worsened at this time.Does not report a blood in his vomitus.   History reviewed. No pertinent past medical history.  There are no active problems to display for this patient.   History reviewed. No pertinent surgical history.  Prior to Admission medications   Not on File    Allergies Review of patient's allergies indicates no known allergies.  History reviewed. No pertinent family history.  Social History Social History  Substance Use Topics  . Smoking status: Current Every Day Smoker    Packs/day: 0.50    Types: Cigarettes  . Smokeless tobacco: Never Used  . Alcohol use No    Review of Systems Constitutional: No fever/chills Eyes: No visual changes. ENT: No sore throat. Cardiovascular: As above Respiratory: Denies shortness of breath. Gastrointestinal:No diarrhea.  No constipation. Genitourinary: Negative for dysuria. Musculoskeletal: Negative for back pain. Skin:  Negative for rash. Neurological: Negative for headaches, focal weakness or numbness.  10-point ROS otherwise negative.  ____________________________________________   PHYSICAL EXAM:  VITAL SIGNS: ED Triage Vitals  Enc Vitals Group     BP 11/30/15 0906 136/84     Pulse Rate 11/30/15 0906 75     Resp 11/30/15 0906 (!) 22     Temp 11/30/15 0857 97.8 F (36.6 C)     Temp Source 11/30/15 0857 Oral     SpO2 11/30/15 0906 100 %     Weight 11/30/15 0852 150 lb (68 kg)     Height 11/30/15 0906 5\' 7"  (1.702 m)     Head Circumference --      Peak Flow --      Pain Score 11/30/15 0852 10     Pain Loc --      Pain Edu? --      Excl. in GC? --     Constitutional: Alert and oriented. Appears uncomfortable. Retching in the room. Eyes: Conjunctivae are normal. PERRL. EOMI. Head: Atraumatic. Nose: No congestion/rhinnorhea. Mouth/Throat: Mucous membranes are moist.   Neck: No stridor.   Cardiovascular: Normal rate, regular rhythm. Grossly normal heart sounds.  Chest pain is reproducible to the left lateral chest where he says he is hurting. Respiratory: Normal respiratory effort.  No retractions. Lungs CTAB. Gastrointestinal: Soft with diffuse tenderness palpation especially to the left upper quadrant. No distention. No CVA tenderness. Musculoskeletal: No lower extremity tenderness nor edema.  No joint effusions. Neurologic:  Normal speech and language. No gross focal neurologic deficits are appreciated.  Skin:  Skin is warm, dry and intact. No rash noted. Psychiatric: Mood and affect are normal. Speech and behavior are normal.  ____________________________________________   LABS (all labs ordered are listed, but only abnormal results are displayed)  Labs Reviewed  LIPASE, BLOOD - Abnormal; Notable for the following:       Result Value   Lipase 58 (*)    All other components within normal limits  COMPREHENSIVE METABOLIC PANEL - Abnormal; Notable for the following:    Potassium 3.2  (*)    CO2 20 (*)    Glucose, Bld 137 (*)    Creatinine, Ser 1.25 (*)    Total Protein 8.2 (*)    Total Bilirubin 1.5 (*)    All other components within normal limits  CBC - Abnormal; Notable for the following:    WBC 18.9 (*)    All other components within normal limits  TROPONIN I  URINALYSIS COMPLETEWITH MICROSCOPIC ([facility] ONLY)   ____________________________________________  EKG  ED ECG REPORT I, Arelia Longest, the attending physician, personally viewed and interpreted this ECG.   Date: 11/30/2015  EKG Time: 1022  Rate: 66  Rhythm: normal sinus rhythm  Axis: Normal  Intervals:none  ST&T Change: No ST segment elevation or depression. No abnormal T-wave inversion.  ____________________________________________  RADIOLOGY  DG Chest 2 View (Accession [id number]) (Order [id number])  Imaging  Date: 11/28/2015 Department: [facility] EMERGENCY DEPARTMENT Released By: Susanne Greenhouse, RN (auto-released) Authorizing: Arnaldo Natal, MD  Exam Information   Status Exam Begun  Exam Ended   Final [99] 11/28/2015 11:47 AM 11/28/2015 11:59 AM  PACS Images   Show images for DG Chest 2 View  Study Result   CLINICAL DATA:  Chest pain.  EXAM: CHEST  2 VIEW  COMPARISON:  None.  FINDINGS: The heart size and mediastinal contours are within normal limits. Both lungs are clear. The visualized skeletal structures are unremarkable.  IMPRESSION: No active cardiopulmonary disease.   Electronically Signed   By: Gerome Sam III M.D   On: 11/28/2015 12:09   CT Abdomen Pelvis W Contrast (Accession [id number]) (Order [id number])  Imaging  Date: 11/30/2015 Department: [facility] EMERGENCY DEPARTMENT Released By/Authorizing: Myrna Blazer, MD (auto-released)  Exam Information   Status Exam Begun  Exam Ended   Final [99] 11/30/2015 11:04 AM 11/30/2015 11:17 AM  PACS Images   Show images for CT Abdomen Pelvis W  Contrast  Study Result   CLINICAL DATA:  Abdominal pain and vomiting for 2 days  EXAM: CT ABDOMEN AND PELVIS WITH CONTRAST  TECHNIQUE: Multidetector CT imaging of the abdomen and pelvis was performed using the standard protocol following bolus administration of intravenous contrast. Oral contrast was also administered.  CONTRAST:  ISOVUE-300 IOPAMIDOL (ISOVUE-300) INJECTION 61%  COMPARISON:  December 29, 2014  FINDINGS: Lower chest: Lung bases are clear. A mild degree of contrast is seen in the distal esophagus, likely indicating a degree of spontaneous gastroesophageal reflux.  Hepatobiliary: No focal liver lesions are evident. Gallbladder wall is not appreciably thickened. There is no biliary duct dilatation.  Pancreas: There is no pancreatic mass or pancreatic inflammatory focus.  Spleen: No splenic lesions are evident.  Adrenals/Urinary Tract: Adrenals appear unremarkable and stable bilaterally. Kidneys bilaterally show no evident mass or hydronephrosis on either side. There is no appreciable renal or ureteral calculus on either side. Urinary bladder is midline with wall thickness within normal limits.  Stomach/Bowel: There is no appreciable bowel wall or mesenteric  thickening. No evident bowel obstruction. No free air or portal venous air.  Vascular/Lymphatic: There are foci of atherosclerotic calcification in the aorta and common iliac arteries. The major mesenteric vessels appear patent. There is no appreciable adenopathy in the abdomen or pelvis.  Reproductive: Prostate and seminal vesicles appear normal in size and contour. There is no pelvic mass or pelvic fluid collection.  Other: The appendix appears normal. There is no ascites or abscess in the abdomen or pelvis.  Musculoskeletal: There are no blastic or lytic bone lesions. There is no intramuscular or abdominal wall lesion.  IMPRESSION: Suspect spontaneous gastroesophageal  reflux.  No bowel wall thickening or bowel obstruction. No abscess. Appendix appears normal.  No renal or ureteral calculus.  No hydronephrosis.  There is atherosclerotic calcification, primarily in the common iliac arteries.   Electronically Signed   By: Bretta BangWilliam  Woodruff III M.D.   On: 11/30/2015 11:25      ____________________________________________   PROCEDURES  Procedure(s) performed:   Procedures  Critical Care performed:   ____________________________________________   INITIAL IMPRESSION / ASSESSMENT AND PLAN / ED COURSE  Pertinent labs & imaging results that were available during my care of the patient were reviewed by me and considered in my medical decision making (see chart for details).  ----------------------------------------- 12:52 PM on 11/30/2015 -----------------------------------------  Patient no longer retching. Appears comfortable at this time. Still complaining of only a mild amount of pain at this time. However, was able to tolerate sips of water as well as by mouth contrast without vomiting. Reassuring CAT scan without any acute abdominal pathology. I discussed the CAT scan results as well as the plan for discharge with the patient as well as his family member who is at the bedside. We'll discharge with an antacid as well as Reglan and Bentyl. Patient understands the plan for discharge and wanted to comply. Suspecting viral etiology as well as reflux is the reason for the patient's chest pain.  Clinical Course     ____________________________________________   FINAL CLINICAL IMPRESSION(S) / ED DIAGNOSES  Abdominal pain with nausea and vomiting. Chest pain.    NEW MEDICATIONS STARTED DURING THIS VISIT:  New Prescriptions   No medications on file     Note:  This document was prepared using Dragon voice recognition software and may include unintentional dictation errors.    Myrna Blazeravid Matthew Schaevitz, MD 11/30/15 1255

## 2016-05-10 ENCOUNTER — Emergency Department: Payer: Self-pay

## 2016-05-10 ENCOUNTER — Encounter: Payer: Self-pay | Admitting: Emergency Medicine

## 2016-05-10 ENCOUNTER — Emergency Department
Admission: EM | Admit: 2016-05-10 | Discharge: 2016-05-11 | Disposition: A | Discharge status: Home / Self Care | Payer: Self-pay | Attending: Emergency Medicine | Admitting: Emergency Medicine

## 2016-05-10 DIAGNOSIS — F17210 Nicotine dependence, cigarettes, uncomplicated: Secondary | ICD-10-CM | POA: Insufficient documentation

## 2016-05-10 DIAGNOSIS — K529 Noninfective gastroenteritis and colitis, unspecified: Secondary | ICD-10-CM | POA: Insufficient documentation

## 2016-05-10 LAB — BASIC METABOLIC PANEL
ANION GAP: 12 (ref 5–15)
BUN: 11 mg/dL (ref 6–20)
CALCIUM: 9.9 mg/dL (ref 8.9–10.3)
CO2: 22 mmol/L (ref 22–32)
CREATININE: 1.01 mg/dL (ref 0.61–1.24)
Chloride: 104 mmol/L (ref 101–111)
Glucose, Bld: 123 mg/dL — ABNORMAL HIGH (ref 65–99)
Potassium: 3.6 mmol/L (ref 3.5–5.1)
SODIUM: 138 mmol/L (ref 135–145)

## 2016-05-10 LAB — CBC
HCT: 45.3 % (ref 40.0–52.0)
HEMOGLOBIN: 14.9 g/dL (ref 13.0–18.0)
MCH: 28.8 pg (ref 26.0–34.0)
MCHC: 32.9 g/dL (ref 32.0–36.0)
MCV: 87.6 fL (ref 80.0–100.0)
PLATELETS: 254 10*3/uL (ref 150–440)
RBC: 5.17 MIL/uL (ref 4.40–5.90)
RDW: 13.7 % (ref 11.5–14.5)
WBC: 18.5 10*3/uL — ABNORMAL HIGH (ref 3.8–10.6)

## 2016-05-10 LAB — LIPASE, BLOOD: LIPASE: 14 U/L (ref 11–51)

## 2016-05-10 LAB — TROPONIN I

## 2016-05-10 MED ORDER — IOPAMIDOL (ISOVUE-300) INJECTION 61%
100.0000 mL | Freq: Once | INTRAVENOUS | Status: AC | PRN
  Administered 2016-05-10: 100 mL via INTRAVENOUS

## 2016-05-10 MED ORDER — ONDANSETRON HCL 4 MG/2ML IJ SOLN
INTRAMUSCULAR | Status: AC
  Administered 2016-05-10: 4 mg via INTRAVENOUS
  Filled 2016-05-10: qty 2

## 2016-05-10 MED ORDER — ONDANSETRON HCL 4 MG/2ML IJ SOLN
4.0000 mg | Freq: Once | INTRAMUSCULAR | Status: AC
  Administered 2016-05-10: 4 mg via INTRAVENOUS

## 2016-05-10 MED ORDER — IOPAMIDOL (ISOVUE-300) INJECTION 61%
30.0000 mL | Freq: Once | INTRAVENOUS | Status: AC | PRN
  Administered 2016-05-10: 30 mL via ORAL

## 2016-05-10 MED ORDER — SODIUM CHLORIDE 0.9 % IV BOLUS (SEPSIS)
1000.0000 mL | Freq: Once | INTRAVENOUS | Status: AC
  Administered 2016-05-10: 1000 mL via INTRAVENOUS

## 2016-05-10 MED ORDER — MORPHINE SULFATE (PF) 4 MG/ML IV SOLN
4.0000 mg | Freq: Once | INTRAVENOUS | Status: AC
Start: 2016-05-10 — End: 2016-05-10
  Administered 2016-05-10: 4 mg via INTRAVENOUS
  Filled 2016-05-10: qty 1

## 2016-05-10 NOTE — ED Notes (Signed)
CP and SOB increases upon exertion. Pt reports pain increases when he rolls to lye on his right or left side. Pt denies having checked for a fever at home but reports having night sweats for the past two days and reports wife was recently dx with the flu.

## 2016-05-10 NOTE — ED Triage Notes (Signed)
Pt to triage via w/c with no distress noted; pt reports left sided CP radiating into back x 2 days accomp by N/V/D; st hx of same with dx with reflux

## 2016-05-10 NOTE — ED Provider Notes (Signed)
[facility] Emergency Department Provider Note   First MD Initiated Contact with Patient 05/10/16 2305     (approximate)  I have reviewed the triage vital signs and the nursing notes.   HISTORY  Chief Complaint Chest Pain    HPI Dean Boyd is a 36 y.o. male presents with 2 day history of generalized abdominal pain is currently 10 out of 10 associated with vomiting. Patient denies any fever no cough. Patient denies any diarrhea or constipation. Patient denies any urinary symptoms   Past medical history GERD There are no active problems to display for this patient.   Past surgical history   Prior to Admission medications   Medication Sig Start Date End Date Taking? Authorizing Provider  dicyclomine (BENTYL) 20 MG tablet Take 1 tablet (20 mg total) by mouth 3 (three) times daily as needed for spasms. 11/30/15 11/29/16  Myrna Blazeravid Matthew Schaevitz, MD  famotidine (PEPCID) 40 MG tablet Take 1 tablet (40 mg total) by mouth every evening. 11/30/15 11/29/16  Myrna Blazeravid Matthew Schaevitz, MD  metoCLOPramide (REGLAN) 10 MG tablet Take 1 tablet (10 mg total) by mouth every 6 (six) hours as needed. 11/30/15   Myrna Blazeravid Matthew Schaevitz, MD    Allergies Patient has no known allergies.  No family history on file.  Social History Social History  Substance Use Topics  . Smoking status: Current Every Day Smoker    Packs/day: 0.50    Types: Cigarettes  . Smokeless tobacco: Never Used  . Alcohol use No    Review of Systems Constitutional: No fever/chills Eyes: No visual changes. ENT: No sore throat. Cardiovascular: Denies chest pain. Respiratory: Denies shortness of breath. Gastrointestinal: Positive for generalized abdominal pain and vomiting Genitourinary: Negative for dysuria. Musculoskeletal: Negative for back pain. Skin: Negative for rash. Neurological: Negative for headaches, focal weakness or numbness.  10-point ROS otherwise  negative.  ____________________________________________   PHYSICAL EXAM:  VITAL SIGNS: ED Triage Vitals [05/10/16 2209]  Enc Vitals Group     BP (!) 155/105     Pulse Rate 82     Resp 20     Temp 99.4 F (37.4 C)     Temp Source Oral     SpO2 94 %     Weight 160 lb (72.6 kg)     Height 5\' 7"  (1.702 m)     Head Circumference      Peak Flow      Pain Score 10     Pain Loc      Pain Edu?      Excl. in GC?     Constitutional: Alert and oriented. Apparent discomfort Eyes: Conjunctivae are normal. PERRL. EOMI. Head: Atraumatic.Marland Kitchen. Mouth/Throat: Mucous membranes are moist. Oropharynx non-erythematous. Neck: No stridor.   Cardiovascular: Normal rate, regular rhythm. Good peripheral circulation. Grossly normal heart sounds. Respiratory: Normal respiratory effort.  No retractions. Lungs CTAB. Gastrointestinal: Generalized tenderness to palpation. No distention.  Musculoskeletal: No lower extremity tenderness nor edema. No gross deformities of extremities. Neurologic:  Normal speech and language. No gross focal neurologic deficits are appreciated.  Skin:  Skin is warm, dry and intact. No rash noted. Psychiatric: Mood and affect are normal. Speech and behavior are normal.  ____________________________________________   LABS (all labs ordered are listed, but only abnormal results are displayed)  Labs Reviewed  BASIC METABOLIC PANEL - Abnormal; Notable for the following:       Result Value   Glucose, Bld 123 (*)    All other components within normal  limits  CBC - Abnormal; Notable for the following:    WBC 18.5 (*)    All other components within normal limits  TROPONIN I  LIPASE, BLOOD   ____________________________________________  EKG  ED ECG REPORT I, [place] N Gedalya Jim, the attending physician, personally viewed and interpreted this ECG.   Date: 05/10/2016  EKG Time: 10:15 PM  Rate: 83  Rhythm: Normal sinus rhythm  Axis: Normal  Intervals: Normal  ST&T Change:  Inferior lateral T-wave inversion  ____________________________________________  RADIOLOGY I, T[place] Dewayne Shorter, personally viewed and evaluated these images (plain radiographs) as part of my medical decision making, as well as reviewing the written report by the radiologist.  Dg Chest 2 View  Result Date: 05/10/2016 CLINICAL DATA:  36 y/o M; mid to left-sided chest pain radiating down to the mid left humerus. EXAM: CHEST  2 VIEW COMPARISON:  11/28/2015 chest radiograph. FINDINGS: Stable heart size and mediastinal contours are within normal limits. Both lungs are clear. The visualized skeletal structures are unremarkable. IMPRESSION: No active cardiopulmonary disease. Electronically Signed   By: Mitzi Hansen M.D.   On: 05/10/2016 22:52     Procedures   ____________________________________________   INITIAL IMPRESSION / ASSESSMENT AND PLAN / ED COURSE  Pertinent labs & imaging results that were available during my care of the patient were reviewed by me and considered in my medical decision making (see chart for details).  ----------------------------------------- 3:39 AM on 05/11/2016 -----------------------------------------  On reevaluation patient's pain completely resolved resting comfortably at this time. Patient drank ginger ale without any difficulty no vomiting. Regarding laboratory data finding of elevated white count at 18.5 CT scan consistent with possible colitis. Regarding patient's chest discomfort troponin negative 2      ____________________________________________  FINAL CLINICAL IMPRESSION(S) / ED DIAGNOSES  Final diagnoses:  Colitis     MEDICATIONS GIVEN DURING THIS VISIT:  Medications  ondansetron (ZOFRAN) 4 MG/2ML injection (not administered)  morphine 4 MG/ML injection 4 mg (not administered)  ondansetron (ZOFRAN) injection 4 mg (not administered)  sodium chloride 0.9 % bolus 1,000 mL (not administered)     NEW OUTPATIENT  MEDICATIONS STARTED DURING THIS VISIT:  New Prescriptions   No medications on file    Modified Medications   No medications on file    Discontinued Medications   No medications on file     Note:  This document was prepared using Dragon voice recognition software and may include unintentional dictation errors.    Darci Current, MD 05/11/16 [phone]

## 2016-05-11 LAB — TROPONIN I: Troponin I: <0.03 ng/mL (ref <0.03)

## 2016-05-11 MED ORDER — DICYCLOMINE HCL 20 MG PO TABS: 20.0000 mg | ORAL_TABLET | Freq: Three times a day (TID) | ORAL | 0 refills | Status: DC | PRN

## 2016-05-11 MED ORDER — ONDANSETRON 4 MG PO TBDP: 4.0000 mg | ORAL_TABLET | Freq: Three times a day (TID) | ORAL | 0 refills | Status: DC | PRN

## 2016-05-11 MED ORDER — SODIUM CHLORIDE 0.9 % IV BOLUS (SEPSIS)
1000.0000 mL | Freq: Once | INTRAVENOUS | Status: AC
  Administered 2016-05-11: 1000 mL via INTRAVENOUS

## 2016-05-11 NOTE — ED Notes (Signed)
Pt discharged to home.  Family member driving.  Discharge instructions reviewed.  Verbalized understanding.  No questions or concerns at this time.  Teach back verified.  Pt in NAD.  No items left in ED.   

## 2016-05-12 ENCOUNTER — Observation Stay
Admission: EM | Admit: 2016-05-12 | Discharge: 2016-05-14 | Disposition: A | Discharge status: Home / Self Care | Payer: Self-pay | Attending: Internal Medicine | Admitting: Internal Medicine

## 2016-05-12 DIAGNOSIS — B9620 Unspecified Escherichia coli [E. coli] as the cause of diseases classified elsewhere: Secondary | ICD-10-CM | POA: Insufficient documentation

## 2016-05-12 DIAGNOSIS — F17210 Nicotine dependence, cigarettes, uncomplicated: Secondary | ICD-10-CM | POA: Insufficient documentation

## 2016-05-12 DIAGNOSIS — R112 Nausea with vomiting, unspecified: Principal | ICD-10-CM | POA: Insufficient documentation

## 2016-05-12 DIAGNOSIS — F1020 Alcohol dependence, uncomplicated: Secondary | ICD-10-CM | POA: Insufficient documentation

## 2016-05-12 DIAGNOSIS — Z8249 Family history of ischemic heart disease and other diseases of the circulatory system: Secondary | ICD-10-CM | POA: Insufficient documentation

## 2016-05-12 DIAGNOSIS — K529 Noninfective gastroenteritis and colitis, unspecified: Secondary | ICD-10-CM | POA: Insufficient documentation

## 2016-05-12 DIAGNOSIS — F1290 Cannabis use, unspecified, uncomplicated: Secondary | ICD-10-CM | POA: Insufficient documentation

## 2016-05-12 DIAGNOSIS — E876 Hypokalemia: Secondary | ICD-10-CM | POA: Insufficient documentation

## 2016-05-12 DIAGNOSIS — Z833 Family history of diabetes mellitus: Secondary | ICD-10-CM | POA: Insufficient documentation

## 2016-05-12 DIAGNOSIS — Z79899 Other long term (current) drug therapy: Secondary | ICD-10-CM | POA: Insufficient documentation

## 2016-05-12 DIAGNOSIS — R109 Unspecified abdominal pain: Secondary | ICD-10-CM | POA: Insufficient documentation

## 2016-05-12 LAB — CBC
HCT: 40.8 % (ref 40.0–52.0)
Hemoglobin: 13.6 g/dL (ref 13.0–18.0)
MCH: 29.1 pg (ref 26.0–34.0)
MCHC: 33.4 g/dL (ref 32.0–36.0)
MCV: 87 fL (ref 80.0–100.0)
Platelets: 233 10*3/uL (ref 150–440)
RBC: 4.69 MIL/uL (ref 4.40–5.90)
RDW: 13.9 % (ref 11.5–14.5)
WBC: 15.8 10*3/uL — AB (ref 3.8–10.6)

## 2016-05-12 LAB — COMPREHENSIVE METABOLIC PANEL
ALBUMIN: 4.3 g/dL (ref 3.5–5.0)
ALK PHOS: 43 U/L (ref 38–126)
ALT: 14 U/L — ABNORMAL LOW (ref 17–63)
ALT: 16 U/L — AB (ref 17–63)
AST: 19 U/L (ref 15–41)
AST: 27 U/L (ref 15–41)
Albumin: 3.9 g/dL (ref 3.5–5.0)
Alkaline Phosphatase: 47 U/L (ref 38–126)
Anion gap: 10 (ref 5–15)
Anion gap: 7 (ref 5–15)
BILIRUBIN TOTAL: 1.2 mg/dL (ref 0.3–1.2)
BUN: 10 mg/dL (ref 6–20)
BUN: 8 mg/dL (ref 6–20)
CALCIUM: 8.8 mg/dL — AB (ref 8.9–10.3)
CHLORIDE: 106 mmol/L (ref 101–111)
CO2: 22 mmol/L (ref 22–32)
CO2: 23 mmol/L (ref 22–32)
CREATININE: 0.88 mg/dL (ref 0.61–1.24)
CREATININE: 1.04 mg/dL (ref 0.61–1.24)
Calcium: 9.2 mg/dL (ref 8.9–10.3)
Chloride: 107 mmol/L (ref 101–111)
GFR calc Af Amer: >60 mL/min (ref >60)
GFR calc Af Amer: >60 mL/min (ref >60)
GFR calc non Af Amer: >60 mL/min (ref >60)
GLUCOSE: 116 mg/dL — AB (ref 65–99)
GLUCOSE: 97 mg/dL (ref 65–99)
POTASSIUM: 2.9 mmol/L — AB (ref 3.5–5.1)
Potassium: 3.6 mmol/L (ref 3.5–5.1)
SODIUM: 138 mmol/L (ref 135–145)
Sodium: 137 mmol/L (ref 135–145)
Total Bilirubin: 1.1 mg/dL (ref 0.3–1.2)
Total Protein: 7.9 g/dL (ref 6.5–8.1)
Total Protein: 7.1 g/dL (ref 6.5–8.1)

## 2016-05-12 LAB — MAGNESIUM: MAGNESIUM: 1.7 mg/dL (ref 1.7–2.4)

## 2016-05-12 LAB — URINE DRUG SCREEN, QUALITATIVE (ARMC ONLY)
Amphetamines, Ur Screen: NOT DETECTED
BENZODIAZEPINE, UR SCRN: NOT DETECTED
Barbiturates, Ur Screen: NOT DETECTED
Cannabinoid 50 Ng, Ur ~~LOC~~: POSITIVE — AB
Cocaine Metabolite,Ur ~~LOC~~: NOT DETECTED
MDMA (ECSTASY) UR SCREEN: NOT DETECTED
Methadone Scn, Ur: NOT DETECTED
Opiate, Ur Screen: POSITIVE — AB
PHENCYCLIDINE (PCP) UR S: NOT DETECTED
TRICYCLIC, UR SCREEN: NOT DETECTED

## 2016-05-12 LAB — LIPASE, BLOOD: Lipase: 30 U/L (ref 11–51)

## 2016-05-12 LAB — CBC WITH DIFFERENTIAL/PLATELET
Basophils Absolute: 0.1 10*3/uL (ref 0–0.1)
Basophils Relative: 0 %
EOS ABS: 0 10*3/uL (ref 0–0.7)
EOS PCT: 0 %
HEMATOCRIT: 39.5 % — AB (ref 40.0–52.0)
Hemoglobin: 13 g/dL (ref 13.0–18.0)
Lymphocytes Relative: 13 %
Lymphs Abs: 1.7 10*3/uL (ref 1.0–3.6)
MCH: 29.6 pg (ref 26.0–34.0)
MCHC: 33 g/dL (ref 32.0–36.0)
MCV: 89.6 fL (ref 80.0–100.0)
MONO ABS: 0.9 10*3/uL (ref 0.2–1.0)
Monocytes Relative: 7 %
Neutro Abs: 11 10*3/uL — ABNORMAL HIGH (ref 1.4–6.5)
Neutrophils Relative %: 80 %
PLATELETS: 212 10*3/uL (ref 150–440)
RBC: 4.4 MIL/uL (ref 4.40–5.90)
RDW: 13.6 % (ref 11.5–14.5)
WBC: 13.7 10*3/uL — ABNORMAL HIGH (ref 3.8–10.6)

## 2016-05-12 LAB — PHOSPHORUS: PHOSPHORUS: 3.9 mg/dL (ref 2.5–4.6)

## 2016-05-12 LAB — TROPONIN I: Troponin I: <0.03 ng/mL (ref <0.03)

## 2016-05-12 LAB — CK: Total CK: 196 U/L (ref 49–397)

## 2016-05-12 LAB — ETHANOL

## 2016-05-12 MED ORDER — FAMOTIDINE 20 MG PO TABS
40.0000 mg | ORAL_TABLET | Freq: Every evening | ORAL | Status: DC
  Administered 2016-05-12 – 2016-05-13 (×2): 40 mg via ORAL
  Filled 2016-05-12 (×2): qty 2

## 2016-05-12 MED ORDER — ONDANSETRON HCL 4 MG/2ML IJ SOLN
4.0000 mg | Freq: Four times a day (QID) | INTRAMUSCULAR | Status: DC | PRN
  Administered 2016-05-13: 4 mg via INTRAVENOUS
  Filled 2016-05-12: qty 2

## 2016-05-12 MED ORDER — KETOROLAC TROMETHAMINE 30 MG/ML IJ SOLN
30.0000 mg | Freq: Four times a day (QID) | INTRAMUSCULAR | Status: DC | PRN
  Administered 2016-05-13: 30 mg via INTRAVENOUS
  Filled 2016-05-12: qty 1

## 2016-05-12 MED ORDER — KETOROLAC TROMETHAMINE 30 MG/ML IJ SOLN
30.0000 mg | Freq: Once | INTRAMUSCULAR | Status: AC
  Administered 2016-05-12: 30 mg via INTRAVENOUS

## 2016-05-12 MED ORDER — IPRATROPIUM BROMIDE 0.02 % IN SOLN: 0.5000 mg | Freq: Four times a day (QID) | RESPIRATORY_TRACT | Status: DC | PRN

## 2016-05-12 MED ORDER — KETOROLAC TROMETHAMINE 60 MG/2ML IM SOLN
INTRAMUSCULAR | Status: AC
  Filled 2016-05-12: qty 2

## 2016-05-12 MED ORDER — PROMETHAZINE HCL 25 MG/ML IJ SOLN
12.5000 mg | Freq: Once | INTRAMUSCULAR | Status: AC
  Administered 2016-05-12: 12.5 mg via INTRAVENOUS

## 2016-05-12 MED ORDER — VITAMIN B-1 100 MG PO TABS
100.0000 mg | ORAL_TABLET | Freq: Every day | ORAL | Status: DC
  Administered 2016-05-13: 100 mg via ORAL
  Filled 2016-05-12 (×2): qty 1

## 2016-05-12 MED ORDER — THIAMINE HCL 100 MG/ML IJ SOLN
100.0000 mg | Freq: Every day | INTRAMUSCULAR | Status: DC
  Administered 2016-05-12: 100 mg via INTRAVENOUS
  Filled 2016-05-12: qty 2

## 2016-05-12 MED ORDER — ONDANSETRON HCL 4 MG PO TABS
4.0000 mg | ORAL_TABLET | Freq: Four times a day (QID) | ORAL | Status: DC | PRN
Start: 2016-05-12 — End: 2016-05-13

## 2016-05-12 MED ORDER — SENNOSIDES-DOCUSATE SODIUM 8.6-50 MG PO TABS: 1.0000 | ORAL_TABLET | Freq: Every evening | ORAL | Status: DC | PRN

## 2016-05-12 MED ORDER — SODIUM CHLORIDE 0.9 % IV BOLUS (SEPSIS)
1000.0000 mL | Freq: Once | INTRAVENOUS | Status: AC
  Administered 2016-05-12: 1000 mL via INTRAVENOUS

## 2016-05-12 MED ORDER — ENOXAPARIN SODIUM 40 MG/0.4ML ~~LOC~~ SOLN
40.0000 mg | SUBCUTANEOUS | Status: DC
  Administered 2016-05-12 – 2016-05-13 (×2): 40 mg via SUBCUTANEOUS
  Filled 2016-05-12 (×2): qty 0.4

## 2016-05-12 MED ORDER — LORAZEPAM 1 MG PO TABS: 1.0000 mg | ORAL_TABLET | Freq: Four times a day (QID) | ORAL | Status: DC | PRN

## 2016-05-12 MED ORDER — DICYCLOMINE HCL 20 MG PO TABS
20.0000 mg | ORAL_TABLET | Freq: Three times a day (TID) | ORAL | Status: DC | PRN
  Filled 2016-05-12: qty 1

## 2016-05-12 MED ORDER — ACETAMINOPHEN 325 MG PO TABS: 650.0000 mg | ORAL_TABLET | Freq: Four times a day (QID) | ORAL | Status: DC | PRN

## 2016-05-12 MED ORDER — CIPROFLOXACIN IN D5W 400 MG/200ML IV SOLN
400.0000 mg | Freq: Two times a day (BID) | INTRAVENOUS | Status: DC
  Administered 2016-05-12 – 2016-05-13 (×4): 400 mg via INTRAVENOUS
  Filled 2016-05-12 (×6): qty 200

## 2016-05-12 MED ORDER — METRONIDAZOLE IN NACL 5-0.79 MG/ML-% IV SOLN
500.0000 mg | Freq: Three times a day (TID) | INTRAVENOUS | Status: DC
  Administered 2016-05-12 – 2016-05-14 (×7): 500 mg via INTRAVENOUS
  Filled 2016-05-12 (×10): qty 100

## 2016-05-12 MED ORDER — METOPROLOL TARTRATE 5 MG/5ML IV SOLN
2.5000 mg | Freq: Once | INTRAVENOUS | Status: AC
  Administered 2016-05-12: 2.5 mg via INTRAVENOUS
  Filled 2016-05-12: qty 5

## 2016-05-12 MED ORDER — ONDANSETRON HCL 4 MG/2ML IJ SOLN
4.0000 mg | Freq: Once | INTRAMUSCULAR | Status: AC
  Administered 2016-05-12: 4 mg via INTRAVENOUS
  Filled 2016-05-12: qty 2

## 2016-05-12 MED ORDER — MAGNESIUM SULFATE 50 % IJ SOLN: 1.0000 g | Freq: Once | INTRAMUSCULAR | Status: DC

## 2016-05-12 MED ORDER — PROMETHAZINE HCL 25 MG/ML IJ SOLN
INTRAMUSCULAR | Status: AC
  Administered 2016-05-12: 12.5 mg via INTRAVENOUS
  Filled 2016-05-12: qty 1

## 2016-05-12 MED ORDER — POTASSIUM CHLORIDE 20 MEQ PO PACK
40.0000 meq | PACK | Freq: Two times a day (BID) | ORAL | Status: DC
  Administered 2016-05-12: 40 meq via ORAL
  Filled 2016-05-12: qty 2

## 2016-05-12 MED ORDER — FOLIC ACID 1 MG PO TABS
1.0000 mg | ORAL_TABLET | Freq: Every day | ORAL | Status: DC
  Administered 2016-05-13: 1 mg via ORAL
  Filled 2016-05-12 (×2): qty 1

## 2016-05-12 MED ORDER — MAGNESIUM CITRATE PO SOLN
1.0000 | Freq: Once | ORAL | Status: DC | PRN
  Filled 2016-05-12: qty 296

## 2016-05-12 MED ORDER — DICYCLOMINE HCL 10 MG PO CAPS
10.0000 mg | ORAL_CAPSULE | Freq: Once | ORAL | Status: AC
  Administered 2016-05-12: 10 mg via ORAL
  Filled 2016-05-12: qty 1

## 2016-05-12 MED ORDER — GI COCKTAIL ~~LOC~~
30.0000 mL | Freq: Once | ORAL | Status: AC
  Administered 2016-05-12: 30 mL via ORAL
  Filled 2016-05-12: qty 30

## 2016-05-12 MED ORDER — ADULT MULTIVITAMIN W/MINERALS CH
1.0000 | ORAL_TABLET | Freq: Every day | ORAL | Status: DC
  Administered 2016-05-13: 1 via ORAL
  Filled 2016-05-12 (×2): qty 1

## 2016-05-12 MED ORDER — POTASSIUM CHLORIDE 2 MEQ/ML IV SOLN
30.0000 meq | Freq: Once | INTRAVENOUS | Status: AC
  Administered 2016-05-12: 30 meq via INTRAVENOUS
  Filled 2016-05-12: qty 15

## 2016-05-12 MED ORDER — ALBUTEROL SULFATE (2.5 MG/3ML) 0.083% IN NEBU: 2.5000 mg | INHALATION_SOLUTION | Freq: Four times a day (QID) | RESPIRATORY_TRACT | Status: DC | PRN

## 2016-05-12 MED ORDER — OXYCODONE HCL 5 MG PO TABS: 5.0000 mg | ORAL_TABLET | ORAL | Status: DC | PRN

## 2016-05-12 MED ORDER — PANTOPRAZOLE SODIUM 40 MG PO TBEC
40.0000 mg | DELAYED_RELEASE_TABLET | Freq: Every day | ORAL | Status: DC
Start: 2016-05-12 — End: 2016-05-14
  Administered 2016-05-12 – 2016-05-13 (×2): 40 mg via ORAL
  Filled 2016-05-12 (×3): qty 1

## 2016-05-12 MED ORDER — LORAZEPAM 2 MG/ML IJ SOLN
1.0000 mg | Freq: Four times a day (QID) | INTRAMUSCULAR | Status: DC | PRN
  Administered 2016-05-12: 1 mg via INTRAVENOUS
  Filled 2016-05-12: qty 1

## 2016-05-12 MED ORDER — ZOLPIDEM TARTRATE 5 MG PO TABS: 5.0000 mg | ORAL_TABLET | Freq: Every evening | ORAL | Status: DC | PRN

## 2016-05-12 MED ORDER — HYDRALAZINE HCL 20 MG/ML IJ SOLN
10.0000 mg | Freq: Four times a day (QID) | INTRAMUSCULAR | Status: DC | PRN
  Administered 2016-05-12 – 2016-05-13 (×2): 10 mg via INTRAVENOUS
  Filled 2016-05-12 (×2): qty 1

## 2016-05-12 MED ORDER — SODIUM CHLORIDE 0.9 % IV SOLN
INTRAVENOUS | Status: DC
  Administered 2016-05-12 – 2016-05-13 (×3): via INTRAVENOUS

## 2016-05-12 MED ORDER — ACETAMINOPHEN 650 MG RE SUPP: 650.0000 mg | Freq: Four times a day (QID) | RECTAL | Status: DC | PRN

## 2016-05-12 MED ORDER — METOCLOPRAMIDE HCL 5 MG/ML IJ SOLN: 10.0000 mg | Freq: Three times a day (TID) | INTRAMUSCULAR | Status: DC | PRN

## 2016-05-12 MED ORDER — MAGNESIUM SULFATE IN D5W 1-5 GM/100ML-% IV SOLN
1.0000 g | Freq: Once | INTRAVENOUS | Status: AC
  Administered 2016-05-12: 1 g via INTRAVENOUS
  Filled 2016-05-12: qty 100

## 2016-05-12 MED ORDER — BISACODYL 5 MG PO TBEC: 5.0000 mg | DELAYED_RELEASE_TABLET | Freq: Every day | ORAL | Status: DC | PRN

## 2016-05-12 NOTE — Progress Notes (Signed)
Pt arrived via stretcher from ED with family at bedside. Pt A&O. Pt is not on telemetry. Orientated to room, call bell within reach.

## 2016-05-12 NOTE — ED Triage Notes (Signed)
Pt in with co abd pain that started 4 days ago co generalized abd pain was here last night for the same but unsure of diagnosis. Pt also co vomiting, no diarrhea. Pt sluggish and diaphoretic in triage.

## 2016-05-12 NOTE — Progress Notes (Signed)
Notified MD of bp 157/110. Orders received. Will continue to monitor and assess.

## 2016-05-12 NOTE — ED Notes (Signed)
Pt admist to marijuana use, states last smoked 05/11/2016 at 0900.

## 2016-05-12 NOTE — ED Provider Notes (Addendum)
[facility] Emergency Department Provider Note    First MD Initiated Contact with Patient 05/12/16 0017     (approximate)  I have reviewed the triage vital signs and the nursing notes.   HISTORY  Chief Complaint Abdominal Pain   HPI Dean Boyd is a 36 y.o. male with no known chronic medical conditions returns to the emergency department with continued nausea and vomiting and abdominal discomfort. Patient was seen yesterday for the same and had complete resolution of all symptoms before discharge. Patient states that during the course of the day he had no symptoms but however symptoms recur tonight with 10 out of 10 abdominal pain that is generalized without any known aggravating or alleviating factors. Patient also admits to multiple episodes of vomiting before presentation.  Past medical history No pertinent past medical history  Patient Active Problem List   Diagnosis Date Noted  . Intractable vomiting with nausea 05/12/2016    No past surgical history on file.  Prior to Admission medications   Medication Sig Start Date End Date Taking? Authorizing Provider  dicyclomine (BENTYL) 20 MG tablet Take 1 tablet (20 mg total) by mouth 3 (three) times daily as needed for spasms. Patient not taking: Reported on 05/12/2016 11/30/15 11/29/16  Myrna Blazer, MD  dicyclomine (BENTYL) 20 MG tablet Take 1 tablet (20 mg total) by mouth 3 (three) times daily as needed for spasms. Patient not taking: Reported on 05/12/2016 05/11/16 05/11/17  Darci Current, MD  famotidine (PEPCID) 40 MG tablet Take 1 tablet (40 mg total) by mouth every evening. Patient not taking: Reported on 05/12/2016 11/30/15 11/29/16  Myrna Blazer, MD  metoCLOPramide (REGLAN) 10 MG tablet Take 1 tablet (10 mg total) by mouth every 6 (six) hours as needed. Patient not taking: Reported on 05/12/2016 11/30/15   Myrna Blazer, MD  ondansetron (ZOFRAN ODT) 4 MG  disintegrating tablet Take 1 tablet (4 mg total) by mouth every 8 (eight) hours as needed for nausea or vomiting. Patient not taking: Reported on 05/12/2016 05/11/16   Darci Current, MD    Allergies Patient has no known allergies.  No family history on file.  Social History Social History  Substance Use Topics  . Smoking status: Current Every Day Smoker    Packs/day: 0.50    Types: Cigarettes  . Smokeless tobacco: Never Used  . Alcohol use No    Review of Systems Constitutional: No fever/chills Eyes: No visual changes. ENT: No sore throat. Cardiovascular: Denies chest pain. Respiratory: Denies shortness of breath. Gastrointestinal: Positive for abdominal pain and vomiting.  Genitourinary: Negative for dysuria. Musculoskeletal: Negative for back pain. Skin: Negative for rash. Neurological: Negative for headaches, focal weakness or numbness.  10-point ROS otherwise negative.  ____________________________________________   PHYSICAL EXAM:  VITAL SIGNS: ED Triage Vitals  Enc Vitals Group     BP 05/12/16 0004 (!) 179/92     Pulse Rate 05/12/16 0004 78     Resp 05/12/16 0004 18     Temp 05/12/16 0004 98.6 F (37 C)     Temp Source 05/12/16 0004 Oral     SpO2 05/12/16 0004 96 %     Weight 05/12/16 0004 150 lb (68 kg)     Height --      Head Circumference --      Peak Flow --      Pain Score 05/12/16 0003 10     Pain Loc --      Pain Edu? --  Excl. in GC? --     Constitutional: Alert and oriented.Apparent discomfort Eyes: Conjunctivae are normal. PERRL. EOMI. Head: Atraumatic. Mouth/Throat: Mucous membranes are moist.  Oropharynx non-erythematous. Neck: No stridor.   Cardiovascular: Normal rate, regular rhythm. Good peripheral circulation. Grossly normal heart sounds. Respiratory: Normal respiratory effort.  No retractions. Lungs CTAB. Gastrointestinal: Generalized tenderness to very mild palpation No distention.  Musculoskeletal: No lower extremity  tenderness nor edema. No gross deformities of extremities. Neurologic:  Normal speech and language. No gross focal neurologic deficits are appreciated.  Skin:  Skin is warm, dry and intact. No rash noted. Psychiatric: Mood and affect are normal. Speech and behavior are normal.  ____________________________________________   LABS (all labs ordered are listed, but only abnormal results are displayed)  Labs Reviewed  URINE DRUG SCREEN, QUALITATIVE ([facility] ONLY) - Abnormal; Notable for the following:       Result Value   Opiate, Ur Screen POSITIVE (*)    Cannabinoid 50 Ng, Ur [place] POSITIVE (*)    All other components within normal limits  CBC - Abnormal; Notable for the following:    WBC 15.8 (*)    All other components within normal limits  COMPREHENSIVE METABOLIC PANEL - Abnormal; Notable for the following:    Potassium 2.9 (*)    Glucose, Bld 116 (*)    ALT 16 (*)    All other components within normal limits  STOOL CULTURE  ETHANOL  TROPONIN I  CK  LIPASE, BLOOD  HIV ANTIBODY (ROUTINE TESTING)  MAGNESIUM  PHOSPHORUS  COMPREHENSIVE METABOLIC PANEL  CBC WITH DIFFERENTIAL/PLATELET   ____________________________________________   Procedures   ____________________________________________   INITIAL IMPRESSION / ASSESSMENT AND PLAN / ED COURSE  Pertinent labs & imaging results that were available during my care of the patient were reviewed by me and considered in my medical decision making (see chart for details).  Patient given multiple doses of N time medics in the emergency department with continued nausea and retching. CT scan performed yesterday revealed colitis. Concern for possible inflammatory colitis. Patient discussed with Dr.Hugelmeyer for hospital admission for further evaluation and management      ____________________________________________  FINAL CLINICAL IMPRESSION(S) / ED DIAGNOSES  Final diagnoses:  Intractable vomiting with nausea, unspecified  vomiting type     MEDICATIONS GIVEN DURING THIS VISIT:  Medications  ketorolac (TORADOL) 60 MG/2ML injection (  Not Given 05/12/16 0134)  potassium chloride (KLOR-CON) packet 40 mEq (40 mEq Oral Given 05/12/16 0357)  dicyclomine (BENTYL) tablet 20 mg (not administered)  famotidine (PEPCID) tablet 40 mg (not administered)  0.9 %  sodium chloride infusion ( Intravenous New Bag/Given 05/12/16 0615)  acetaminophen (TYLENOL) tablet 650 mg (not administered)    Or  acetaminophen (TYLENOL) suppository 650 mg (not administered)  oxyCODONE (Oxy IR/ROXICODONE) immediate release tablet 5 mg (not administered)  zolpidem (AMBIEN) tablet 5 mg (not administered)  senna-docusate (Senokot-S) tablet 1 tablet (not administered)  bisacodyl (DULCOLAX) EC tablet 5 mg (not administered)  magnesium citrate solution 1 Bottle (not administered)  ondansetron (ZOFRAN) tablet 4 mg (not administered)    Or  ondansetron (ZOFRAN) injection 4 mg (not administered)  albuterol (PROVENTIL) (2.5 MG/3ML) 0.083% nebulizer solution 2.5 mg (not administered)  ipratropium (ATROVENT) nebulizer solution 0.5 mg (not administered)  ketorolac (TORADOL) 30 MG/ML injection 30 mg (not administered)  metoCLOPramide (REGLAN) injection 10 mg (not administered)  metroNIDAZOLE (FLAGYL) IVPB 500 mg (not administered)  LORazepam (ATIVAN) tablet 1 mg ( Oral See Alternative 05/12/16 1610)    Or  LORazepam (ATIVAN) injection 1 mg (1 mg Intravenous Given 05/12/16 0653)  thiamine (VITAMIN B-1) tablet 100 mg (not administered)    Or  thiamine (B-1) injection 100 mg (not administered)  folic acid (FOLVITE) tablet 1 mg (not administered)  multivitamin with minerals tablet 1 tablet (not administered)  ciprofloxacin (CIPRO) IVPB 400 mg (not administered)  ondansetron (ZOFRAN) injection 4 mg (4 mg Intravenous Given 05/12/16 0033)  sodium chloride 0.9 % bolus 1,000 mL (0 mLs Intravenous Stopped 05/12/16 0200)  ketorolac (TORADOL) 30 MG/ML injection 30  mg (30 mg Intravenous Given 05/12/16 0133)  gi cocktail (Maalox,Lidocaine,Donnatal) (30 mLs Oral Given 05/12/16 0228)  promethazine (PHENERGAN) injection 12.5 mg (12.5 mg Intravenous Given 05/12/16 0221)  dicyclomine (BENTYL) capsule 10 mg (10 mg Oral Given 05/12/16 0357)  metoprolol (LOPRESSOR) injection 2.5 mg (2.5 mg Intravenous Given 05/12/16 0614)     NEW OUTPATIENT MEDICATIONS STARTED DURING THIS VISIT:  Current Discharge Medication List      Current Discharge Medication List      Current Discharge Medication List       Note:  This document was prepared using Dragon voice recognition software and may include unintentional dictation errors.    Darci Current, MD 05/12/16 4098    Darci Current, MD 05/12/16 [phone]

## 2016-05-12 NOTE — Progress Notes (Signed)
Spoke with dr. Nemiah Commander to make aware Dean Boyd will not be on till tomorrow. Patient currently npo. Last potassium was 2.9 got 40 meq in er. Current bp 170/89. Per md will order iv potassium, iv hydralazine and start on full liquid diet. 0800 labs currently pending. Will continue to monitor

## 2016-05-12 NOTE — ED Notes (Signed)
Pt and girlfiend separately denies narcotic/opiod use.

## 2016-05-12 NOTE — Progress Notes (Signed)
[employer] [facility] [facility] [place] [facility]   PATIENT NAME: Dean Boyd    MR#:  [id number]  DATE OF BIRTH:  08-19-1980  SUBJECTIVE:  CHIEF COMPLAINT:   Chief Complaint  Patient presents with  . Abdominal Pain   -Admitted for repeated episodes of nausea, vomiting and abdominal pain. Complains of lower abdominal pain. -Received Ativan for alcohol withdrawal today and is sleepy at this time. Wife is at bedside  REVIEW OF SYSTEMS:  Review of Systems  Unable to perform ROS: Mental status change    DRUG ALLERGIES:  No Known Allergies  VITALS:  Blood pressure (!) 155/92, pulse 78, temperature 98.4 F (36.9 C), temperature source Axillary, resp. rate 18, height  (1.702 m), weight 67.9 kg (149 lb 11.2 oz), SpO2 97 %.  PHYSICAL EXAMINATION:  Physical Exam  GENERAL:  36 y.o.-year-old patient lying in the bed with no acute distress. sedated EYES: Pupils equal, round, reactive to light and accommodation. No scleral icterus. Extraocular muscles intact.  HEENT: Head atraumatic, normocephalic. Oropharynx and nasopharynx clear.  NECK:  Supple, no jugular venous distention. No thyroid enlargement, no tenderness.  LUNGS: Normal breath sounds bilaterally, no wheezing, rales,rhonchi or crepitation. No use of accessory muscles of respiration.  CARDIOVASCULAR: S1, S2 normal. No murmurs, rubs, or gallops.  ABDOMEN: Soft, tender all over especially in the right lower quadrant, nondistended. Bowel sounds present. No organomegaly or mass.  EXTREMITIES: No pedal edema, cyanosis, or clubbing.  NEUROLOGIC: Cranial nerves II through XII are intact. Muscle strength 5/5 in all extremities. Sensation intact. Gait not checked.  PSYCHIATRIC: The patient is sleepy, easily awakened and oriented x 2-3  SKIN: No obvious rash, lesion, or ulcer.    LABORATORY PANEL:   CBC  Recent Labs Lab 05/12/16 0845  WBC 13.7*  HGB 13.0  HCT 39.5*  PLT 212    ------------------------------------------------------------------------------------------------------------------  Chemistries   Recent Labs Lab 05/12/16 0845  NA 137  K 3.6  CL 107  CO2 23  GLUCOSE 97  BUN 8  CREATININE 0.88  CALCIUM 8.8*  MG 1.7  AST 19  ALT 14*  ALKPHOS 43  BILITOT 1.2   ------------------------------------------------------------------------------------------------------------------  Cardiac Enzymes  Recent Labs Lab 05/12/16 0012  TROPONINI <0.03   ------------------------------------------------------------------------------------------------------------------  RADIOLOGY:  Dg Chest 2 View  Result Date: 05/10/2016 CLINICAL DATA:  36 y/o M; mid to left-sided chest pain radiating down to the mid left humerus. EXAM: CHEST  2 VIEW COMPARISON:  11/28/2015 chest radiograph. FINDINGS: Stable heart size and mediastinal contours are within normal limits. Both lungs are clear. The visualized skeletal structures are unremarkable. IMPRESSION: No active cardiopulmonary disease. Electronically Signed   By: Mitzi Hansen M.D.   On: 05/10/2016 22:52   Ct Abdomen Pelvis W Contrast  Result Date: 05/11/2016 CLINICAL DATA:  Generalized abdominal pain. EXAM: CT ABDOMEN AND PELVIS WITH CONTRAST TECHNIQUE: Multidetector CT imaging of the abdomen and pelvis was performed using the standard protocol following bolus administration of intravenous contrast. CONTRAST:  ISOVUE-300 IOPAMIDOL (ISOVUE-300) INJECTION 61% COMPARISON:  CT 11/30/2015 FINDINGS: Lower chest: Lung bases are clear. Hepatobiliary: Minimal focal fatty infiltration adjacent with falciform ligament. Liver is otherwise unremarkable. Gallbladder physiologically distended, no calcified stone. No biliary dilatation. Pancreas: No ductal dilatation or inflammation. Spleen: Normal in size without focal abnormality. Adrenals/Urinary Tract: Adrenal glands are unremarkable. Kidneys are normal, without  renal calculi, focal lesion, or hydronephrosis. Bladder is unremarkable. Stomach/Bowel: Mild thickening of the distal esophagus. Stomach physiologically distended with ingested contents. No evidence  of abnormal bowel distention. The appendix is normal. Transverse and descending colon are decompressed, difficult to exclude mild wall thickening. No perienteric inflammation. Vascular/Lymphatic: Mild atherosclerosis of the distal abdominal aorta and iliac arteries, advanced for age. No adenopathy. Reproductive: Prostate is unremarkable. Other: No free air, free fluid, or intra-abdominal fluid collection. Musculoskeletal: There are no acute or suspicious osseous abnormalities. IMPRESSION: 1. Equivocal wall thickening of the transverse and descending colon, favored to represent nondistention, less likely mild colitis. No pericolonic inflammation. 2. Mild wall thickening of the distal esophagus can be seen with reflux. Electronically Signed   By: Rubye Oaks M.D.   On: 05/11/2016 00:08    EKG:   Orders placed or performed during the hospital encounter of 05/12/16  . EKG 12-Lead  . EKG 12-Lead    ASSESSMENT AND PLAN:   36 year old male with past medical history significant for alcohol use, marijuana use admitted to hospital due to repeated episodes of nausea, vomiting and abdominal pain.  #1 nausea/vomiting with abdominal pain-CT of the abdomen showing acute colitis. -Continue Flagyl and Cipro. Started on a liquid diet today. -Symptomatic treatment with fluids and antinausea medications. 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 -Patient is pretty sedated at this time. GI consult pending.  #2 Hypokalemia and hypomagnesemia- being replaced  #3 possible acute gastritis-due to alcohol use. Continue Protonix at this time.  #4 alcohol abuse-on withdrawal protocol at this time.  #5 DVT prophylaxis-continue Lovenox     All the  records are reviewed and case discussed with Care Management/Social Workerr. Management plans discussed with the patient, family and they are in agreement.  CODE STATUS: Full code  TOTAL TIME TAKING CARE OF THIS PATIENT: 37 minutes.   POSSIBLE D/C IN 1-2 DAYS, DEPENDING ON CLINICAL CONDITION.   Man Bonneau M.D on 05/12/2016 at 12:51 PM  Between 7am to 6pm - Pager - [phone]  After 6pm go to [email or web address] - Social research officer, government  [facility]  Office  [phone]  CC: Primary care physician; No PCP Per Patient

## 2016-05-12 NOTE — Progress Notes (Signed)
Pharmacy Antibiotic Note  Dean Boyd is a 36 y.o. male admitted on 05/12/2016 with intra-abdominal infx.  Pharmacy has been consulted for ciprofloxacin dosing.  Plan: Ciprofloxacin 400 mg IV q12h   QTC WNL  Height:  (170.2 cm) Weight: 149 lb 11.2 oz (67.9 kg) IBW/kg (Calculated) : 66.1  Temp (24hrs), Avg:98 F (36.7 C), Min:97.4 F (36.3 C), Max:98.6 F (37 C)   Recent Labs Lab 05/10/16 2212 05/12/16 0012  WBC 18.5* 15.8*  CREATININE 1.01 1.04    Estimated Creatinine Clearance: 92.7 mL/min (by C-G formula based on SCr of 1.04 mg/dL).    No Known Allergies   Thank you for allowing pharmacy to be a part of this patient's care.  Thomasene Ripple, PharmD, BCPS Clinical Pharmacist. 05/12/2016

## 2016-05-12 NOTE — H&P (Signed)
History and Physical   [facility] - [place] @ [facility] Admission History and Physical [employer], D.O.    Patient Name: Dean Boyd MR#: [id number] Date of Birth: [birth year] Date of Admission: 05/12/2016  Referring MD/NP/PA: Dr. Manson Passey Primary Care Physician: No PCP Per Patient Patient coming from: Home     Chief Complaint:  Chief Complaint  Patient presents with  . Abdominal Pain    HPI: Dean Boyd is a 36 y.o. male with no known history presents to the emergency department for evaluation of vomiting and abdominal pain.  This is the patient's third ER visit for the same symptoms.  Initially complained of 10/10 abdominal pain associated with intractable vomiting, loose watery, non-bloody stools.  Patient denies weakness, dizziness, chest pain, shortness of breath, dysuria/frequency, changes in mental status.   Otherwise there has been no change in status. No recent antibiotics.  There has been no recent illness, hospitalizations, travel or sick contacts.    EMS/ED Course: Patient received Bentyl, GI cocktail, Toradol, Zofran, KCl, Phenergan, NS.  Review of Systems:  CONSTITUTIONAL: No fever/chills, fatigue, weakness, weight gain/loss, headache. EYES: No blurry or double vision. ENT: No tinnitus, postnasal drip, redness or soreness of the oropharynx. RESPIRATORY: No cough, dyspnea, wheeze.  No hemoptysis.  CARDIOVASCULAR: No chest pain, palpitations, syncope, orthopnea. No lower extremity edema.  GASTROINTESTINAL: Positive nausea, vomiting, abdominal pain. No diarrhea, constipation.  No hematemesis, melena or hematochezia. GENITOURINARY: No dysuria, frequency, hematuria. ENDOCRINE: No polyuria or nocturia. No heat or cold intolerance. HEMATOLOGY: No anemia, bruising, bleeding. INTEGUMENTARY: No rashes, ulcers, lesions. MUSCULOSKELETAL: No arthritis, gout, dyspnea. NEUROLOGIC: No numbness, tingling, ataxia, seizure-type activity, weakness. PSYCHIATRIC: No  anxiety, depression, insomnia.   No past medical history on file.  No past surgical history on file.  Smokes half pack cigarettes per day, smokes marijuana intermittently.  Drinks 80-120 ounces of beer daily.  Last drink two days ago.  Denies opiates, benzos, cocaine.   No Known Allergies  Family History   Medical History Relation Name Comments  Diabetes type II Father    High blood pressure (Hypertension) Father    High blood pressure (Hypertension) Maternal Grandfather    High blood pressure (Hypertension) Maternal Grandmother    High blood pressure (Hypertension) Mother    High blood pressure (Hypertension) Paternal Grandfather    Diabetes type II Paternal Grandmother    High blood pressure (Hypertension) Paternal Grandmother       Prior to Admission medications   Medication Sig Start Date End Date Taking? Authorizing Provider  dicyclomine (BENTYL) 20 MG tablet Take 1 tablet (20 mg total) by mouth 3 (three) times daily as needed for spasms. 11/30/15 11/29/16  Myrna Blazer, MD  dicyclomine (BENTYL) 20 MG tablet Take 1 tablet (20 mg total) by mouth 3 (three) times daily as needed for spasms. 05/11/16 05/11/17  Darci Current, MD  famotidine (PEPCID) 40 MG tablet Take 1 tablet (40 mg total) by mouth every evening. 11/30/15 11/29/16  Myrna Blazer, MD  metoCLOPramide (REGLAN) 10 MG tablet Take 1 tablet (10 mg total) by mouth every 6 (six) hours as needed. 11/30/15   Myrna Blazer, MD  ondansetron (ZOFRAN ODT) 4 MG disintegrating tablet Take 1 tablet (4 mg total) by mouth every 8 (eight) hours as needed for nausea or vomiting. 05/11/16   Darci Current, MD    Physical Exam: Vitals:   05/12/16 0315 05/12/16 0330 05/12/16 0345 05/12/16 0400  BP: (!) 174/110 (!) 163/106 (!) 159/109 (!) 184/125  Pulse: 74 73 72 80  Resp: Temp:      TempSrc:      SpO2: 96% 91% 97% 99%  Weight:        GENERAL: 36 y.o.-year-old male  patient, ill-appearing.  Lethargic. Diaphoretic HEENT: Head atraumatic, normocephalic. Pupils equal, round, reactive to light and accommodation. No scleral icterus. Extraocular muscles intact. Nares are patent. Oropharynx is clear. Mucus membranes dry. NECK: Supple, full range of motion. No JVD, no bruit heard. No thyroid enlargement, no tenderness, no cervical lymphadenopathy. CHEST: Normal breath sounds bilaterally. No wheezing, rales, rhonchi or crackles. No use of accessory muscles of respiration.  No reproducible chest wall tenderness.  CARDIOVASCULAR: S1, S2 normal. No murmurs, rubs, or gallops. Cap refill <2 seconds. Pulses intact distally.  ABDOMEN: Soft, severe diffuse tenderness to palpation worse at LLQ, voluntary guarding..No rebound or rigidity. Normoactive bowel sounds present in all four quadrants. No organomegaly or mass. EXTREMITIES: No pedal edema, cyanosis, or clubbing. No calf tenderness or Homan's sign.  NEUROLOGIC: The patient is alert and oriented x 3. Cranial nerves II through XII are grossly intact with no focal sensorimotor deficit. Muscle strength 5/5 in all extremities. Sensation intact. Gait not checked. PSYCHIATRIC:  Normal affect, mood, thought content. SKIN: Warm, dry, and intact without obvious rash, lesion, or ulcer.    Labs on Admission:  CBC:  Recent Labs Lab 05/10/16 2212 05/12/16 0012  WBC 18.5* 15.8*  HGB 14.9 13.6  HCT 45.3 40.8  MCV 87.6 87.0  PLT 254 233   Basic Metabolic Panel:  Recent Labs Lab 05/10/16 2212 05/12/16 0012  NA 138 138  K 3.6 2.9*  CL 104 106  CO2 22 22  GLUCOSE 123* 116*  BUN 11 10  CREATININE 1.01 1.04  CALCIUM 9.9 9.2   GFR: Estimated Creatinine Clearance: 92.7 mL/min (by C-G formula based on SCr of 1.04 mg/dL). Liver Function Tests:  Recent Labs Lab 05/12/16 0012  AST 27  ALT 16*  ALKPHOS 47  BILITOT 1.1  PROT 7.9  ALBUMIN 4.3    Recent Labs Lab 05/10/16 2212 05/12/16 0012  LIPASE 14 30   No  results for input(s): AMMONIA in the last 168 hours. Coagulation Profile: No results for input(s): INR, PROTIME in the last 168 hours. Cardiac Enzymes:  Recent Labs Lab 05/10/16 2212 05/11/16 0146 05/12/16 0012  CKTOTAL  --   --  196  TROPONINI <0.03 <0.03 <0.03   BNP (last 3 results) No results for input(s): PROBNP in the last 8760 hours. HbA1C: No results for input(s): HGBA1C in the last 72 hours. CBG: No results for input(s): GLUCAP in the last 168 hours. Lipid Profile: No results for input(s): CHOL, HDL, LDLCALC, TRIG, CHOLHDL, LDLDIRECT in the last 72 hours. Thyroid Function Tests: No results for input(s): TSH, T4TOTAL, FREET4, T3FREE, THYROIDAB in the last 72 hours. Anemia Panel: No results for input(s): VITAMINB12, FOLATE, FERRITIN, TIBC, IRON, RETICCTPCT in the last 72 hours. Urine analysis:    Component Value Date/Time   COLORURINE YELLOW (A) 09/20/2015 1735   APPEARANCEUR CLEAR (A) 09/20/2015 1735   LABSPEC 1.024 09/20/2015 1735   PHURINE 6.0 09/20/2015 1735   GLUCOSEU NEGATIVE 09/20/2015 1735   HGBUR NEGATIVE 09/20/2015 1735   BILIRUBINUR NEGATIVE 09/20/2015 1735   KETONESUR NEGATIVE 09/20/2015 1735   PROTEINUR NEGATIVE 09/20/2015 1735   NITRITE NEGATIVE 09/20/2015 1735   LEUKOCYTESUR NEGATIVE 09/20/2015 1735   Sepsis Labs: (procalcitonin:4,lacticidven:4) )No results found for this or any previous visit (from the  past 240 hour(s)).   Radiological Exams on Admission: Dg Chest 2 View  Result Date: 05/10/2016 CLINICAL DATA:  36 y/o M; mid to left-sided chest pain radiating down to the mid left humerus. EXAM: CHEST  2 VIEW COMPARISON:  11/28/2015 chest radiograph. FINDINGS: Stable heart size and mediastinal contours are within normal limits. Both lungs are clear. The visualized skeletal structures are unremarkable. IMPRESSION: No active cardiopulmonary disease. Electronically Signed   By: Mitzi Hansen M.D.   On: 05/10/2016 22:52   Ct  Abdomen Pelvis W Contrast  Result Date: 05/11/2016 CLINICAL DATA:  Generalized abdominal pain. EXAM: CT ABDOMEN AND PELVIS WITH CONTRAST TECHNIQUE: Multidetector CT imaging of the abdomen and pelvis was performed using the standard protocol following bolus administration of intravenous contrast. CONTRAST:  ISOVUE-300 IOPAMIDOL (ISOVUE-300) INJECTION 61% COMPARISON:  CT 11/30/2015 FINDINGS: Lower chest: Lung bases are clear. Hepatobiliary: Minimal focal fatty infiltration adjacent with falciform ligament. Liver is otherwise unremarkable. Gallbladder physiologically distended, no calcified stone. No biliary dilatation. Pancreas: No ductal dilatation or inflammation. Spleen: Normal in size without focal abnormality. Adrenals/Urinary Tract: Adrenal glands are unremarkable. Kidneys are normal, without renal calculi, focal lesion, or hydronephrosis. Bladder is unremarkable. Stomach/Bowel: Mild thickening of the distal esophagus. Stomach physiologically distended with ingested contents. No evidence of abnormal bowel distention. The appendix is normal. Transverse and descending colon are decompressed, difficult to exclude mild wall thickening. No perienteric inflammation. Vascular/Lymphatic: Mild atherosclerosis of the distal abdominal aorta and iliac arteries, advanced for age. No adenopathy. Reproductive: Prostate is unremarkable. Other: No free air, free fluid, or intra-abdominal fluid collection. Musculoskeletal: There are no acute or suspicious osseous abnormalities. IMPRESSION: 1. Equivocal wall thickening of the transverse and descending colon, favored to represent nondistention, less likely mild colitis. No pericolonic inflammation. 2. Mild wall thickening of the distal esophagus can be seen with reflux. Electronically Signed   By: Rubye Oaks M.D.   On: 05/11/2016 00:08    EKG: Normal sinus rhythm at 74 bpm with normal axis, LVH, T wave inversions in II, III, avF nonspecific ST-T wave changes.    Assessment/Plan  This is a 36 y.o. male with a history of multiple ER visits for abdominal pain with vomiting now being admitted with:  #. Abdominal pain, intractable vomiting concern for colitis. Prior CT scans have been read as possible colitis vs. underdistention. - Admit observation - IV fluid hydration - Antiemetics, pain control - Check stool studies.  CT yesterday showed possibility of colitis. Given persistent symptoms, fever, severe abdominal tenderness and leukocytosis will treat with Cipro/Flagyl.  - GI consult given recurrent symptoms, ?Crohn's  #. Leukocytosis.  Patient has had elevated WBC at every visit.  - Follow up blood, urine and stool - CBC with diff in AM  #. Hypokalemia 2/2 #1 - Replaced in ED - Repeat BMP in AM  #. EtOH use disorder, severe, concern for withdrawal, last drink 2 days ago - CIWA - Social work  Research scientist (life sciences). Abnormal EKG with inferior T wave inversion, LVH - Trops negative here in the ED - Recommend outpatient cardiology workup for abnormal findings.    Admission status: Observation IV Fluids: NS Diet/Nutrition: NPO Consults called: None  DVT Px:  SCDs and early ambulation. Code Status: Full Code  Disposition Plan: To home in <24 hours  All the records are reviewed and case discussed with ED provider. Management plans discussed with the patient and/or family who express understanding and agree with plan of care.  Badr Piedra D.O. on 05/12/2016 at 4:28  AM Between 7am to 6pm - Pager - [phone] After 6pm go to [email or web address] - Biomedical engineer [place] [facility] Office [phone] CC: Primary care physician; No PCP Per Patient   05/12/2016, 4:28 AM

## 2016-05-13 LAB — BASIC METABOLIC PANEL
ANION GAP: 6 (ref 5–15)
BUN: 7 mg/dL (ref 6–20)
CHLORIDE: 110 mmol/L (ref 101–111)
CO2: 23 mmol/L (ref 22–32)
Calcium: 8.3 mg/dL — ABNORMAL LOW (ref 8.9–10.3)
Creatinine, Ser: 1.04 mg/dL (ref 0.61–1.24)
GFR calc Af Amer: >60 mL/min (ref >60)
GLUCOSE: 89 mg/dL (ref 65–99)
POTASSIUM: 3.2 mmol/L — AB (ref 3.5–5.1)
Sodium: 139 mmol/L (ref 135–145)

## 2016-05-13 LAB — CBC
HEMATOCRIT: 36.2 % — AB (ref 40.0–52.0)
HEMOGLOBIN: 12.2 g/dL — AB (ref 13.0–18.0)
MCH: 30.3 pg (ref 26.0–34.0)
MCHC: 33.7 g/dL (ref 32.0–36.0)
MCV: 90 fL (ref 80.0–100.0)
Platelets: 187 10*3/uL (ref 150–440)
RBC: 4.03 MIL/uL — ABNORMAL LOW (ref 4.40–5.90)
RDW: 13.6 % (ref 11.5–14.5)
WBC: 10.7 10*3/uL — ABNORMAL HIGH (ref 3.8–10.6)

## 2016-05-13 LAB — GASTROINTESTINAL PANEL BY PCR, STOOL (REPLACES STOOL CULTURE)
Adenovirus F40/41: NOT DETECTED
Astrovirus: NOT DETECTED
CYCLOSPORA CAYETANENSIS: NOT DETECTED
Campylobacter species: NOT DETECTED
Cryptosporidium: NOT DETECTED
ENTAMOEBA HISTOLYTICA: NOT DETECTED
Enteroaggregative E coli (EAEC): DETECTED — AB
Enteropathogenic E coli (EPEC): NOT DETECTED
Enterotoxigenic E coli (ETEC): NOT DETECTED
Giardia lamblia: NOT DETECTED
Norovirus GI/GII: NOT DETECTED
Plesimonas shigelloides: NOT DETECTED
Rotavirus A: NOT DETECTED
SALMONELLA SPECIES: NOT DETECTED
SAPOVIRUS (I, II, IV, AND V): NOT DETECTED
SHIGA LIKE TOXIN PRODUCING E COLI (STEC): NOT DETECTED
SHIGELLA/ENTEROINVASIVE E COLI (EIEC): NOT DETECTED
VIBRIO CHOLERAE: NOT DETECTED
VIBRIO SPECIES: NOT DETECTED
Yersinia enterocolitica: NOT DETECTED

## 2016-05-13 LAB — HIV ANTIBODY (ROUTINE TESTING W REFLEX): HIV SCREEN 4TH GENERATION: NONREACTIVE

## 2016-05-13 MED ORDER — ONDANSETRON HCL 4 MG/2ML IJ SOLN
4.0000 mg | Freq: Four times a day (QID) | INTRAMUSCULAR | Status: DC
  Administered 2016-05-13 – 2016-05-14 (×4): 4 mg via INTRAVENOUS
  Filled 2016-05-13 (×4): qty 2

## 2016-05-13 MED ORDER — PROMETHAZINE HCL 25 MG/ML IJ SOLN
25.0000 mg | Freq: Four times a day (QID) | INTRAMUSCULAR | Status: DC | PRN
  Administered 2016-05-13: 25 mg via INTRAVENOUS
  Filled 2016-05-13: qty 1

## 2016-05-13 MED ORDER — MORPHINE SULFATE (PF) 4 MG/ML IV SOLN
2.0000 mg | INTRAVENOUS | Status: DC | PRN
  Administered 2016-05-13: 2 mg via INTRAVENOUS
  Filled 2016-05-13: qty 1

## 2016-05-13 MED ORDER — POTASSIUM CHLORIDE CRYS ER 20 MEQ PO TBCR
40.0000 meq | EXTENDED_RELEASE_TABLET | Freq: Once | ORAL | Status: AC
  Administered 2016-05-13: 40 meq via ORAL
  Filled 2016-05-13: qty 2

## 2016-05-13 NOTE — Progress Notes (Signed)
[facility] - [place] at [facility]   PATIENT NAME: Dean Boyd    MR#:  [id number]  DATE OF BIRTH:  03/02/1980  SUBJECTIVE:  CHIEF COMPLAINT:   Chief Complaint  Patient presents with  . Abdominal Pain   -very unhappy today, appears sleepy with zofran, complains of lower abdominal pain, nausea/vomiting and wants to go home since not getting better - diarrhea resolved.  REVIEW OF SYSTEMS:  Review of Systems  Constitutional: Positive for malaise/fatigue. Negative for chills and fever.  HENT: Negative for congestion, ear discharge, hearing loss and nosebleeds.   Eyes: Negative for blurred vision and double vision.  Respiratory: Negative for cough, shortness of breath and wheezing.   Cardiovascular: Negative for chest pain, palpitations and leg swelling.  Gastrointestinal: Positive for abdominal pain, nausea and vomiting. Negative for constipation and diarrhea.  Genitourinary: Negative for dysuria and hematuria.  Musculoskeletal: Positive for myalgias.  Neurological: Negative for dizziness, seizures and headaches.  Psychiatric/Behavioral: Negative for depression. The patient is nervous/anxious.     DRUG ALLERGIES:  No Known Allergies  VITALS:  Blood pressure 125/87, pulse 69, temperature 98.3 F (36.8 C), temperature source Oral, resp. rate 18, height  (1.702 m), weight 67.9 kg (149 lb 11.2 oz), SpO2 99 %.  PHYSICAL EXAMINATION:  Physical Exam  GENERAL:  36 y.o.-year-old patient lying in the bed with no acute distress.  EYES: Pupils equal, round, reactive to light and accommodation. No scleral icterus. Extraocular muscles intact.  HEENT: Head atraumatic, normocephalic. Oropharynx and nasopharynx clear.  NECK:  Supple, no jugular venous distention. No thyroid enlargement, no tenderness.  LUNGS: Normal breath sounds bilaterally, no wheezing, rales,rhonchi or crepitation. No use of accessory muscles of respiration.  CARDIOVASCULAR: S1, S2 normal. No  murmurs, rubs, or gallops.  ABDOMEN: Soft, tender all over especially in the right lower quadrant, nondistended. Bowel sounds present. No organomegaly or mass.  EXTREMITIES: No pedal edema, cyanosis, or clubbing.  NEUROLOGIC: Cranial nerves II through XII are intact. Muscle strength 5/5 in all extremities. Sensation intact. Gait not checked.  PSYCHIATRIC: The patient is sleepy, easily awakened and oriented x 2-3  SKIN: No obvious rash, lesion, or ulcer.    LABORATORY PANEL:   CBC  Recent Labs Lab 05/13/16 0532  WBC 10.7*  HGB 12.2*  HCT 36.2*  PLT 187   ------------------------------------------------------------------------------------------------------------------  Chemistries   Recent Labs Lab 05/12/16 0845 05/13/16 0532  NA 137 139  K 3.6 3.2*  CL 107 110  CO2 23 23  GLUCOSE 97 89  BUN 8 7  CREATININE 0.88 1.04  CALCIUM 8.8* 8.3*  MG 1.7  --   AST 19  --   ALT 14*  --   ALKPHOS 43  --   BILITOT 1.2  --    ------------------------------------------------------------------------------------------------------------------  Cardiac Enzymes  Recent Labs Lab 05/12/16 0012  TROPONINI <0.03   ------------------------------------------------------------------------------------------------------------------  RADIOLOGY:  No results found.  EKG:   Orders placed or performed during the hospital encounter of 05/12/16  . EKG 12-Lead  . EKG 12-Lead    ASSESSMENT AND PLAN:   36 year old male with past medical history significant for alcohol use, marijuana use admitted to hospital due to repeated episodes of nausea, vomiting and abdominal pain.  #1 nausea/vomiting with abdominal pain-CT of the abdomen showing acute colitis. - diarrhea has resolved, stool studies positive for E.coli -Continue Flagyl and Cipro. on a liquid diet today. -Symptomatic treatment with fluids and antinausea medications. 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 -GI consult pending.  #2 Hypokalemia and hypomagnesemia- replaced  #3 possible acute gastritis-due to alcohol use. Continue Protonix at this time.  #4 alcohol abuse-on withdrawal protocol at this time.  #5 DVT prophylaxis-continue Lovenox   Explained that we still need to monitor atleast another 24hrs at this time as clinically not improving, wants to go home. Risks explained, if has to leave- need to sign AMA paper   All the records are reviewed and case discussed with Care Management/Social Workerr. Management plans discussed with the patient, family and they are in agreement.  CODE STATUS: Full code  TOTAL TIME TAKING CARE OF THIS PATIENT: 39 minutes.   POSSIBLE D/C IN 1 DAY, DEPENDING ON CLINICAL CONDITION.   Enid Baas M.D on 05/13/2016 at 11:15 AM  Between 7am to 6pm - Pager - [phone]  After 6pm go to [email or web address] - Social research officer, government  [facility] [place] [facility]  Office  [phone]  CC: Primary care physician; No PCP Per Patient

## 2016-05-13 NOTE — Progress Notes (Signed)
MD Mody made aware of stool specimen results.

## 2016-05-13 NOTE — Progress Notes (Signed)
DR Nemiah Commander was made aware of pt bp 170/103, PRN meds given , will continue to monitor

## 2016-05-13 NOTE — Consult Note (Signed)
Consultation  Referring Provider:     No ref. provider found Primary Care Physician:  No PCP Per Patient Primary Gastroenterologist: None         Reason for Consultation: abdominal pain  Date of Admission:  05/12/2016 Date of Consultation:  05/13/2016         HPI:   Dean Boyd is a 36 y.o. male with a history notable for alcoholism whom I am seeing in consultation for abdominal pain.   I could not obtain a history from this patient as he was very sedated and reported being in too much pain to talk to me. No one was available to provide collateral information.   Per the chart, the patient presented to the ER for the third time for abdominal pain and vomiting. He also reported diarrhea. He had imaging that raised concern for colitis versus under-distention of his colon. He was admitted for further evaluation and management.   No past medical history on file.  No past surgical history on file.  Prior to Admission medications   Medication Sig Start Date End Date Taking? Authorizing Provider  dicyclomine (BENTYL) 20 MG tablet Take 1 tablet (20 mg total) by mouth 3 (three) times daily as needed for spasms. Patient not taking: Reported on 05/12/2016 11/30/15 11/29/16  Myrna Blazer, MD  dicyclomine (BENTYL) 20 MG tablet Take 1 tablet (20 mg total) by mouth 3 (three) times daily as needed for spasms. Patient not taking: Reported on 05/12/2016 05/11/16 05/11/17  Darci Current, MD  famotidine (PEPCID) 40 MG tablet Take 1 tablet (40 mg total) by mouth every evening. Patient not taking: Reported on 05/12/2016 11/30/15 11/29/16  Myrna Blazer, MD  metoCLOPramide (REGLAN) 10 MG tablet Take 1 tablet (10 mg total) by mouth every 6 (six) hours as needed. Patient not taking: Reported on 05/12/2016 11/30/15   Myrna Blazer, MD  ondansetron (ZOFRAN ODT) 4 MG disintegrating tablet Take 1 tablet (4 mg total) by mouth every 8 (eight) hours as needed for nausea or  vomiting. Patient not taking: Reported on 05/12/2016 05/11/16   Darci Current, MD    No family history on file.   Social History  Substance Use Topics  . Smoking status: Current Every Day Smoker    Packs/day: 0.50    Types: Cigarettes  . Smokeless tobacco: Never Used  . Alcohol use No    Allergies as of 05/12/2016  . (No Known Allergies)    Review of Systems:    All systems reviewed and negative except where noted in HPI.   Physical Exam:  Vital signs in last 24 hours: Temp:  [98.2 F (36.8 C)-99.6 F (37.6 C)] 98.2 F (36.8 C) (03/31 1406) Pulse Rate:  [68-83] 80 (03/31 1539) Resp:  [14-18] 14 (03/31 1406) BP: (115-170)/(79-106) 155/89 (03/31 1539) SpO2:  [98 %-100 %] 99 % (03/31 1406) Last BM Date: 05/12/16   General: Young somnolent male laying on his right side clutching his abdomen Head: Normocephalic and atraumatic. Lungs: Respirations even and unlabored.  Heart:  Regular rate and rhythm Abdomen: +BS, soft, TTP with light palpation in all quadrants, but worse in LLQ without r/g, nondistended Rectal: Not performed. Msk: Symmetrical without gross deformities. Extremities: Without edema, cyanosis or clubbing. Neurologic: somnolent, not responsive to loud voice, but responsive to touch Skin: Intact, numerous tattoos  LAB RESULTS:  Recent Labs  05/12/16 0012 05/12/16 0845 05/13/16 0532  WBC 15.8* 13.7* 10.7*  HGB 13.6 13.0 12.2*  HCT 40.8 39.5*  36.2*  PLT 233 212 187   BMET  Recent Labs  05/12/16 0012 05/12/16 0845 05/13/16 0532  NA 138 137 139  K 2.9* 3.6 3.2*  CL 106 107 110  CO2 GLUCOSE 116* 97 89  BUN CREATININE 1.04 0.88 1.04  CALCIUM 9.2 8.8* 8.3*   LFT  Recent Labs  05/12/16 0845  PROT 7.1  ALBUMIN 3.9  AST 19  ALT 14*  ALKPHOS 43  BILITOT 1.2   PT/INR No results for input(s): LABPROT, INR in the last 72 hours.  STUDIES: No results found.    Impression / Plan:   Dean Boyd is a 36 y.o. y/o  male with a history notable for alcoholism whom I am seeing in consultation for abdominal pain. Upon admission, the patient's work up revealed enteroaggregative E.coli (EAEC), which is most likely the cause for the patient's abdominal pain, vomiting and diarrhea. Since he required hospitalization for this, he merits treatment with a flouroquinolone. I am not convinced that patient's imaging demonstrated colitis, although that it certainly possible.   - continue ciprofloxain to complete a 7 day course  - supportive management   Thank you for involving me in the care of this patient.      LOS: 0 days   Bluford Kaufmann, MD  05/13/2016, 4:15 PM

## 2016-05-14 ENCOUNTER — Encounter: Payer: Self-pay | Admitting: Emergency Medicine

## 2016-05-14 LAB — BASIC METABOLIC PANEL
Anion gap: 5 (ref 5–15)
BUN: 6 mg/dL (ref 6–20)
CO2: 23 mmol/L (ref 22–32)
Calcium: 8.6 mg/dL — ABNORMAL LOW (ref 8.9–10.3)
Chloride: 113 mmol/L — ABNORMAL HIGH (ref 101–111)
Creatinine, Ser: 1.02 mg/dL (ref 0.61–1.24)
GFR calc Af Amer: >60 mL/min (ref >60)
GFR calc non Af Amer: >60 mL/min (ref >60)
Glucose, Bld: 86 mg/dL (ref 65–99)
Potassium: 3.4 mmol/L — ABNORMAL LOW (ref 3.5–5.1)
Sodium: 141 mmol/L (ref 135–145)

## 2016-05-14 LAB — MAGNESIUM: Magnesium: 1.8 mg/dL (ref 1.7–2.4)

## 2016-05-14 MED ORDER — SODIUM CHLORIDE 0.9 % IV SOLN
30.0000 meq | Freq: Once | INTRAVENOUS | Status: DC
  Filled 2016-05-14: qty 15

## 2016-05-14 MED ORDER — CIPROFLOXACIN HCL 500 MG PO TABS: 500.0000 mg | ORAL_TABLET | Freq: Two times a day (BID) | ORAL | 0 refills | Status: DC

## 2016-05-14 MED ORDER — ONDANSETRON 4 MG PO TBDP: 4.0000 mg | ORAL_TABLET | Freq: Three times a day (TID) | ORAL | 0 refills | Status: DC | PRN

## 2016-05-14 NOTE — Progress Notes (Signed)
Patient being discharged home with spouse.  Will have a course of antibiotics.  Attempted to talk about the risks of his drinking.  His wife interrupted to show him a picture.  I took this as a sign that they did not want to discuss it.

## 2016-05-14 NOTE — Discharge Summary (Signed)
[employer] - [place] at [facility]   PATIENT NAME: Dean Boyd    MR#:  [id number]  DATE OF BIRTH:  1980-09-26  DATE OF ADMISSION:  05/12/2016   ADMITTING PHYSICIAN: Jon Gills Hugelmeyer, DO  DATE OF DISCHARGE: 05/14/2016 10:55 AM  PRIMARY CARE PHYSICIAN: No PCP Per Patient   ADMISSION DIAGNOSIS:   abd pain  DISCHARGE DIAGNOSIS:   Active Problems:   Intractable vomiting with nausea   SECONDARY DIAGNOSIS:   History reviewed. No pertinent past medical history.  HOSPITAL COURSE:   36 year old male with past medical history significant for alcohol use, marijuana use admitted to hospital due to repeated episodes of nausea, vomiting and abdominal pain.  #1 nausea/vomiting with abdominal pain-CT of the abdomen showing acute colitis. - diarrhea has resolved, stool studies positive for E.coli -appreciate GI consult - symptoms resolved on the day of discharge. Will discharge on oral cipro for 7 days -tolerating a regular diet prior to discharge  #2 Hypokalemia and hypomagnesemia- replaced  #3 possible acute gastritis-due to alcohol use. Resolved at the time of discharge. Did not need any PPI at discharge  #4 alcohol abuse- counseled prior to discharge Polysubstance abuse- marijuana use as well- counseled about how it can cause cyclical nausea/vomiting  Feels stable and ready for discharge today    DISCHARGE CONDITIONS:   Stable  CONSULTS OBTAINED:   Treatment Team:  Bluford Kaufmann, MD  DRUG ALLERGIES:   No Known Allergies DISCHARGE MEDICATIONS:   Allergies as of 05/14/2016   No Known Allergies     Medication List    STOP taking these medications   famotidine 40 MG tablet Commonly known as:  PEPCID   metoCLOPramide 10 MG tablet Commonly known as:  REGLAN     TAKE these medications   ciprofloxacin 500 MG tablet Commonly known as:  CIPRO Take 1 tablet (500 mg total) by mouth 2 (two) times daily. X 7 more days   dicyclomine 20  MG tablet Commonly known as:  BENTYL Take 1 tablet (20 mg total) by mouth 3 (three) times daily as needed for spasms. What changed:  Another medication with the same name was removed. Continue taking this medication, and follow the directions you see here.   ondansetron 4 MG disintegrating tablet Commonly known as:  ZOFRAN ODT Take 1 tablet (4 mg total) by mouth every 8 (eight) hours as needed for nausea or vomiting.        DISCHARGE INSTRUCTIONS:   1. PCP f/u in 1-2 weeks  DIET:   Regular diet  ACTIVITY:   Activity as tolerated  OXYGEN:   Home Oxygen: No.  Oxygen Delivery: room air  DISCHARGE LOCATION:   home   If you experience worsening of your admission symptoms, develop shortness of breath, life threatening emergency, suicidal or homicidal thoughts you must seek medical attention immediately by calling 911 or calling your MD immediately  if symptoms less severe.  You Must read complete instructions/literature along with all the possible adverse reactions/side effects for all the Medicines you take and that have been prescribed to you. Take any new Medicines after you have completely understood and accpet all the possible adverse reactions/side effects.   Please note  You were cared for by a hospitalist during your hospital stay. If you have any questions about your discharge medications or the care you received while you were in the hospital after you are discharged, you can call the unit and asked to speak with the hospitalist on call if  the hospitalist that took care of you is not available. Once you are discharged, your primary care physician will handle any further medical issues. Please note that NO REFILLS for any discharge medications will be authorized once you are discharged, as it is imperative that you return to your primary care physician (or establish a relationship with a primary care physician if you do not have one) for your aftercare needs so that they  can reassess your need for medications and monitor your lab values.    On the day of Discharge:  VITAL SIGNS:   Blood pressure 118/78, pulse 78, temperature 98.9 F (37.2 C), temperature source Oral, resp. rate 16, height  (1.702 m), weight 67.9 kg (149 lb 11.2 oz), SpO2 98 %.  PHYSICAL EXAMINATION:    GENERAL:  36 y.o.-year-old patient lying in the bed with no acute distress.  EYES: Pupils equal, round, reactive to light and accommodation. No scleral icterus. Extraocular muscles intact.  HEENT: Head atraumatic, normocephalic. Oropharynx and nasopharynx clear.  NECK:  Supple, no jugular venous distention. No thyroid enlargement, no tenderness.  LUNGS: Normal breath sounds bilaterally, no wheezing, rales,rhonchi or crepitation. No use of accessory muscles of respiration.  CARDIOVASCULAR: S1, S2 normal. No murmurs, rubs, or gallops.  ABDOMEN: Soft, non-tender today, non-distended. Bowel sounds present. No organomegaly or mass.  EXTREMITIES: No pedal edema, cyanosis, or clubbing.  NEUROLOGIC: Cranial nerves II through XII are intact. Muscle strength 5/5 in all extremities. Sensation intact. Gait not checked.  PSYCHIATRIC: The patient is alert and oriented x 3.  SKIN: No obvious rash, lesion, or ulcer.   DATA REVIEW:   CBC  Recent Labs Lab 05/13/16 0532  WBC 10.7*  HGB 12.2*  HCT 36.2*  PLT 187    Chemistries   Recent Labs Lab 05/12/16 0845  05/14/16 0514  NA 137  < > 141  K 3.6  < > 3.4*  CL 107  < > 113*  CO2 23  < > 23  GLUCOSE 97  < > 86  BUN 8  < > 6  CREATININE 0.88  < > 1.02  CALCIUM 8.8*  < > 8.6*  MG 1.7  --  1.8  AST 19  --   --   ALT 14*  --   --   ALKPHOS 43  --   --   BILITOT 1.2  --   --   < > = values in this interval not displayed.   Microbiology Results  Results for orders placed or performed during the hospital encounter of 05/12/16  Gastrointestinal Panel by PCR , Stool     Status: Abnormal   Collection Time: 05/12/16 10:53 PM    Result Value Ref Range Status   Campylobacter species NOT DETECTED NOT DETECTED Final   Plesimonas shigelloides NOT DETECTED NOT DETECTED Final   Salmonella species NOT DETECTED NOT DETECTED Final   Yersinia enterocolitica NOT DETECTED NOT DETECTED Final   Vibrio species NOT DETECTED NOT DETECTED Final   Vibrio cholerae NOT DETECTED NOT DETECTED Final   Enteroaggregative E coli (EAEC) DETECTED (A) NOT DETECTED Final    Comment: RESULT CALLED TO, READ BACK BY AND VERIFIED WITH: Wynn Banker AT [id number] 05/13/16.PMH    Enteropathogenic E coli (EPEC) NOT DETECTED NOT DETECTED Final   Enterotoxigenic E coli (ETEC) NOT DETECTED NOT DETECTED Final   Shiga like toxin producing E coli (STEC) NOT DETECTED NOT DETECTED Final   Shigella/Enteroinvasive E coli (EIEC) NOT DETECTED NOT DETECTED Final  Cryptosporidium NOT DETECTED NOT DETECTED Final   Cyclospora cayetanensis NOT DETECTED NOT DETECTED Final   Entamoeba histolytica NOT DETECTED NOT DETECTED Final   Giardia lamblia NOT DETECTED NOT DETECTED Final   Adenovirus F40/41 NOT DETECTED NOT DETECTED Final   Astrovirus NOT DETECTED NOT DETECTED Final   Norovirus GI/GII NOT DETECTED NOT DETECTED Final   Rotavirus A NOT DETECTED NOT DETECTED Final   Sapovirus (I, II, IV, and V) NOT DETECTED NOT DETECTED Final    RADIOLOGY:  No results found.   Management plans discussed with the patient, family and they are in agreement.  CODE STATUS:     Code Status Orders        Start     Ordered   05/12/16 0558  Full code  Continuous     05/12/16 0557    Code Status History    Date Active Date Inactive Code Status Order ID Comments User Context   This patient has a current code status but no historical code status.      TOTAL TIME TAKING CARE OF THIS PATIENT: 38 minutes.    Enid Baas M.D on 05/14/2016 at 11:34 AM  Between 7am to 6pm - Pager - [phone]  After 6pm go to [email or web address] - Scientist, research (life sciences)  [facility]  Office  [phone]  CC: Primary care physician; No PCP Per Patient   Note: This dictation was prepared with Dragon dictation along with smaller phrase technology. Any transcriptional errors that result from this process are unintentional.

## 2017-04-06 IMAGING — CT CT ABD-PELV W/ CM
2 of 4 series · 16 of 46 positions shown, 18 images · IV contrast (omnipaque)
Comparison: CT 12/23/2014

CLINICAL DATA: Left upper and lower quadrant pain and vomiting for
1 week. Lumbar spine pain. Progressive symptoms.

EXAM:
CT ABDOMEN AND PELVIS WITH CONTRAST
TECHNIQUE: Multidetector CT imaging of the abdomen and pelvis was performed
using the standard protocol following bolus administration of
intravenous contrast.
CONTRAST:  100mL OMNIPAQUE IOHEXOL 300 MG/ML  SOLN

[Series 2: routine abd pel with · axial · 0.67mm/px · z∈[-886,-466]mm · 13 of 92 slices shown, 15 images]
[im 4/92  soft-tissue]
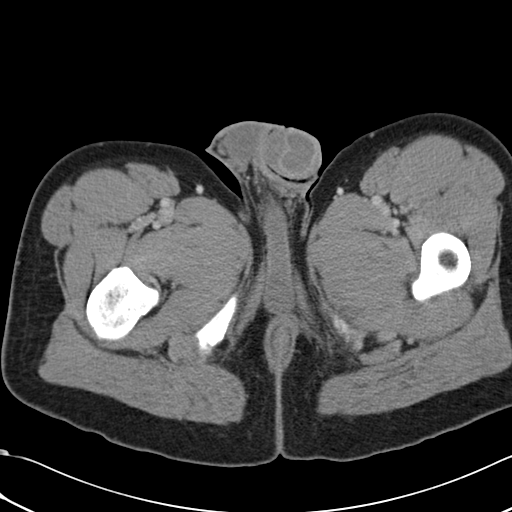
[im 4/92  bone]
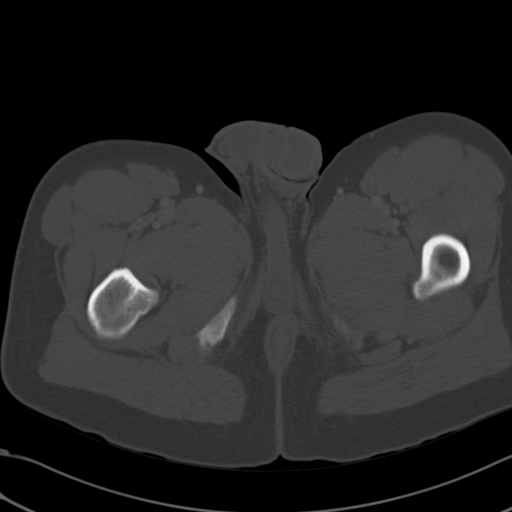
[im 12/92  soft-tissue]
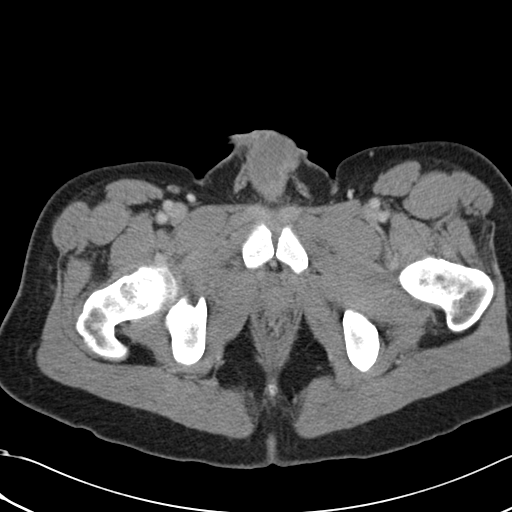
[im 19/92  soft-tissue]
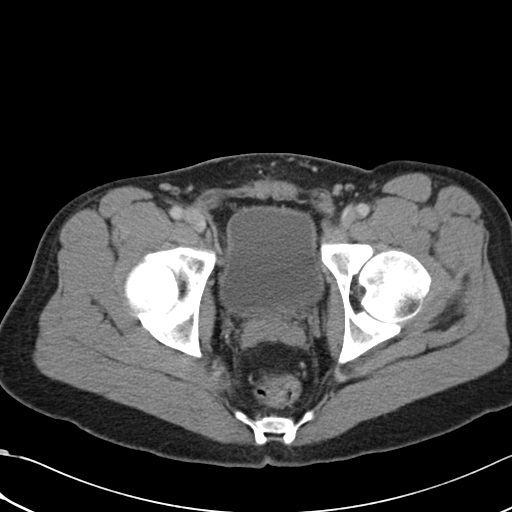
[im 27/92  soft-tissue]
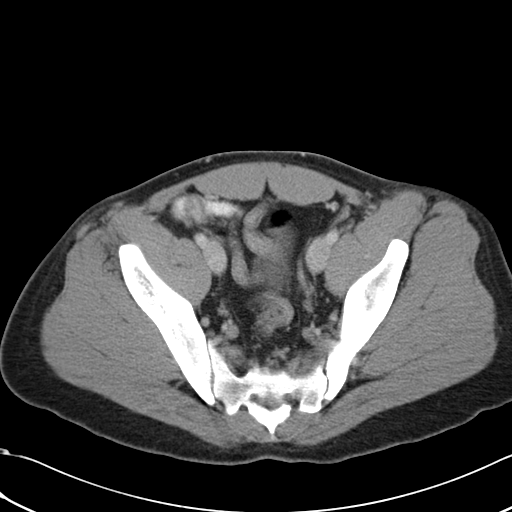
[im 31/92  soft-tissue]
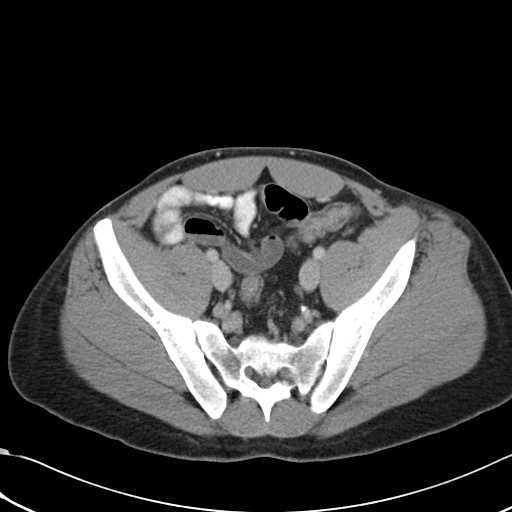
[im 38/92  soft-tissue]
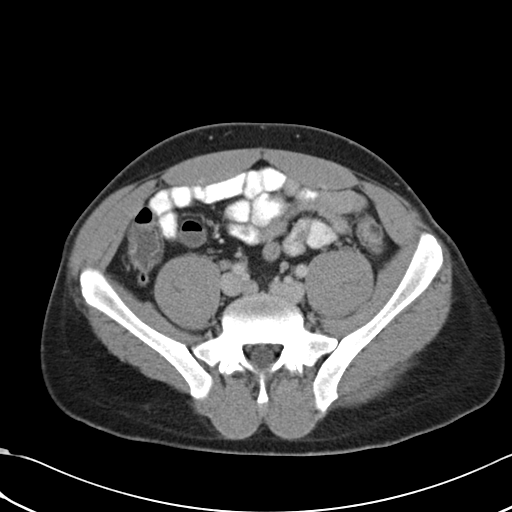
[im 46/92  soft-tissue]
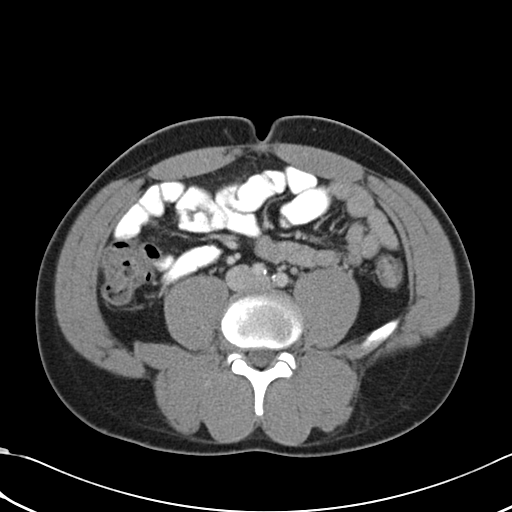
[im 54/92  soft-tissue]
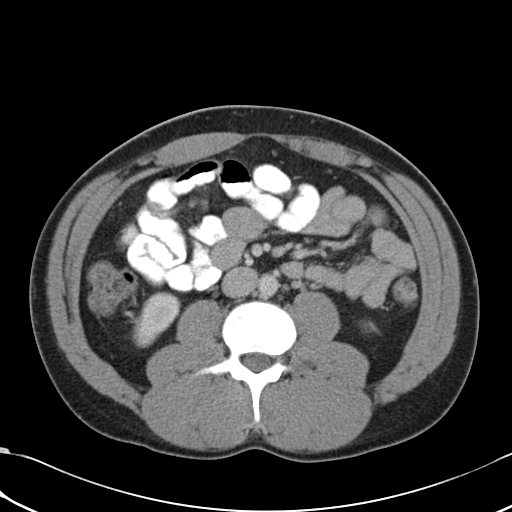
[im 61/92  soft-tissue]
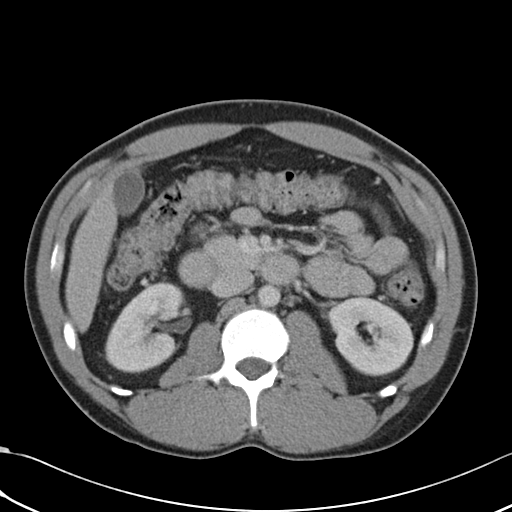
[im 61/92  bone]
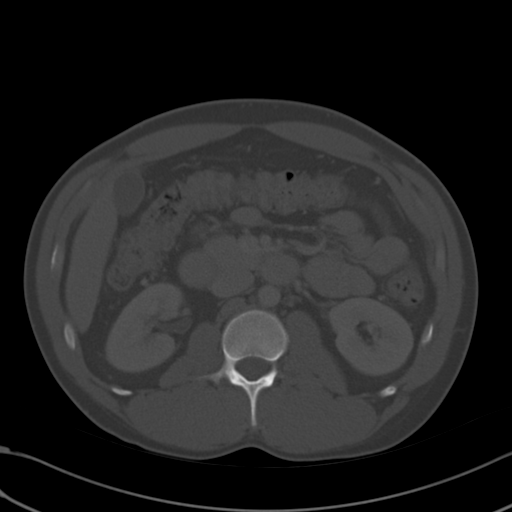
[im 65/92  soft-tissue]
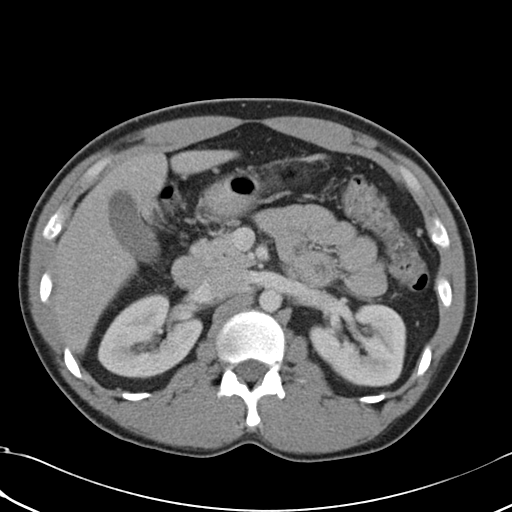
[im 73/92  soft-tissue]
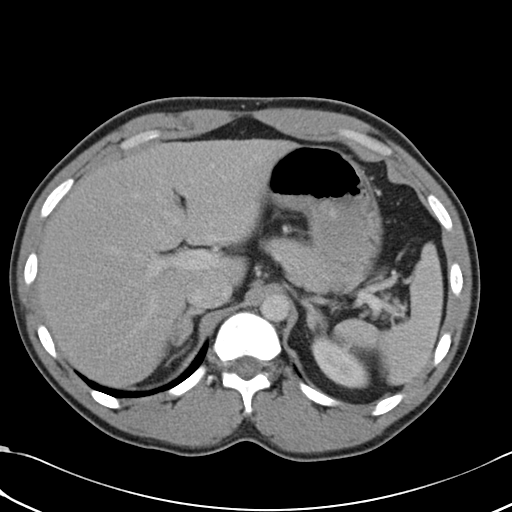
[im 80/92  soft-tissue]
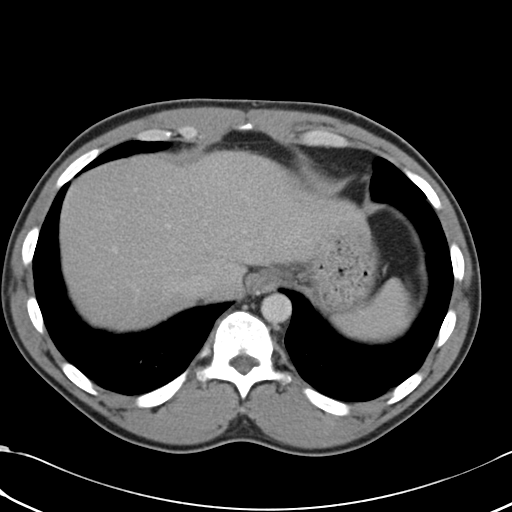
[im 88/92  soft-tissue]
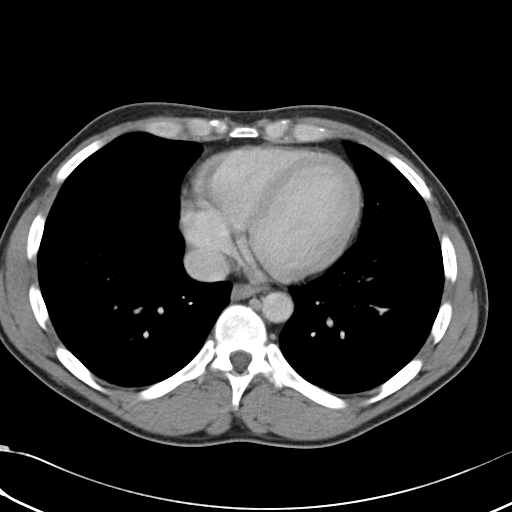

[Series 5: cor routine abd pel with · coronal · 0.70mm/px · 3 of 119 slices shown]
[im 40/119  soft-tissue]
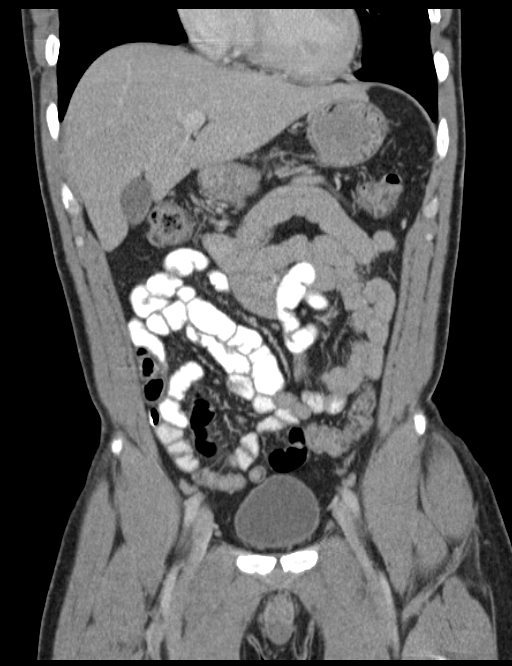
[im 53/119  soft-tissue]
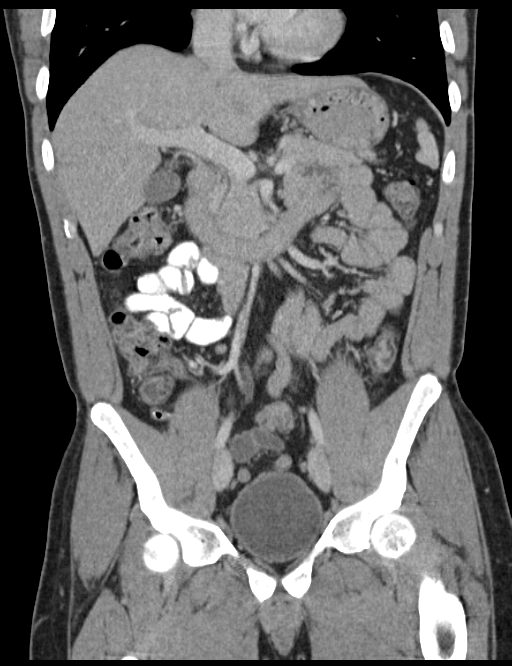
[im 66/119  soft-tissue]
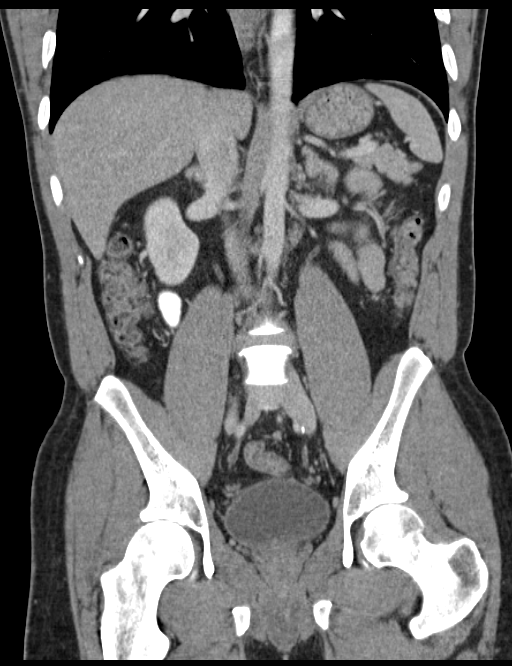

[16 of 46 positions shown; findings below may reference images not displayed]

FINDINGS: Lower chest: The included lung bases are clear. The heart is normal
in size.

Liver: Minimal focal fatty infiltration adjacent with falciform
ligament. No suspicious lesion.

Hepatobiliary: The gallbladder physiologically distended, no
calcified stone. No biliary dilatation.

Pancreas: Normal.  No ductal dilatation or surrounding inflammation.

Spleen: Normal.

Adrenal glands: No nodule.

Kidneys: Symmetric renal enhancement. No hydronephrosis. No
perinephric stranding or urolithiasis.

Stomach/Bowel: Stomach physiologically distended. There are no
dilated or thickened small bowel loops. The colon is decompressed.
Equivocal colonic wall thickening throughout likely related to
nondistention, less likely mild colitis. No pericolonic inflammatory
change. The appendix is normal.

Vascular/Lymphatic: No retroperitoneal adenopathy. Abdominal aorta
is normal in caliber. There is atherosclerosis, unchanged from prior
exam and advanced for age.

Reproductive: Normal sized prostate gland.

Bladder: Physiologically distended, no wall thickening.

Other: No free air, free fluid, or intra-abdominal fluid collection.
No inguinal or abdominal wall hernia.

Musculoskeletal: There are no acute or suspicious osseous
abnormalities. Particularly, normal appearance of the lumbar spine.
IMPRESSION: 1. Decompressed colon with equivocal diffuse wall thickening, versus
nondistention. Nondistention is favored given the lack of adjacent
inflammatory change, however mild colitis could have a similar
appearance.
2. Otherwise no acute abnormality in the abdomen/pelvis.
3. Atherosclerosis again seen, advanced for age.

## 2017-05-03 ENCOUNTER — Emergency Department
Admission: EM | Admit: 2017-05-03 | Discharge: 2017-05-04 | Disposition: A | Discharge status: Home / Self Care | Payer: Self-pay | Attending: Emergency Medicine | Admitting: Emergency Medicine

## 2017-05-03 ENCOUNTER — Other Ambulatory Visit: Payer: Self-pay

## 2017-05-03 DIAGNOSIS — R748 Abnormal levels of other serum enzymes: Secondary | ICD-10-CM | POA: Insufficient documentation

## 2017-05-03 DIAGNOSIS — Z79899 Other long term (current) drug therapy: Secondary | ICD-10-CM | POA: Insufficient documentation

## 2017-05-03 DIAGNOSIS — F17210 Nicotine dependence, cigarettes, uncomplicated: Secondary | ICD-10-CM | POA: Insufficient documentation

## 2017-05-03 DIAGNOSIS — E876 Hypokalemia: Secondary | ICD-10-CM | POA: Insufficient documentation

## 2017-05-03 DIAGNOSIS — R112 Nausea with vomiting, unspecified: Secondary | ICD-10-CM | POA: Insufficient documentation

## 2017-05-03 DIAGNOSIS — R197 Diarrhea, unspecified: Secondary | ICD-10-CM | POA: Insufficient documentation

## 2017-05-03 LAB — CBC WITH DIFFERENTIAL/PLATELET
Basophils Absolute: 0.1 10*3/uL (ref 0–0.1)
Basophils Relative: 1 %
Eosinophils Absolute: 0.1 10*3/uL (ref 0–0.7)
Eosinophils Relative: 1 %
HEMATOCRIT: 51.2 % (ref 40.0–52.0)
HEMOGLOBIN: 16.3 g/dL (ref 13.0–18.0)
LYMPHS ABS: 2.3 10*3/uL (ref 1.0–3.6)
LYMPHS PCT: 15 %
MCH: 28.6 pg (ref 26.0–34.0)
MCHC: 31.8 g/dL — AB (ref 32.0–36.0)
MCV: 89.8 fL (ref 80.0–100.0)
MONOS PCT: 7 %
Monocytes Absolute: 1 10*3/uL (ref 0.2–1.0)
NEUTROS PCT: 76 %
Neutro Abs: 11.9 10*3/uL — ABNORMAL HIGH (ref 1.4–6.5)
Platelets: 224 10*3/uL (ref 150–440)
RBC: 5.7 MIL/uL (ref 4.40–5.90)
RDW: 13.9 % (ref 11.5–14.5)
WBC: 15.4 10*3/uL — ABNORMAL HIGH (ref 3.8–10.6)

## 2017-05-03 LAB — COMPREHENSIVE METABOLIC PANEL
ALBUMIN: 4 g/dL (ref 3.5–5.0)
ALK PHOS: 42 U/L (ref 38–126)
ALT: 17 U/L (ref 17–63)
ANION GAP: 15 (ref 5–15)
AST: 26 U/L (ref 15–41)
BILIRUBIN TOTAL: 1.2 mg/dL (ref 0.3–1.2)
BUN: 12 mg/dL (ref 6–20)
CALCIUM: 8.6 mg/dL — AB (ref 8.9–10.3)
CO2: 20 mmol/L — ABNORMAL LOW (ref 22–32)
Chloride: 101 mmol/L (ref 101–111)
Creatinine, Ser: 1.14 mg/dL (ref 0.61–1.24)
GFR calc Af Amer: >60 mL/min (ref >60)
GFR calc non Af Amer: >60 mL/min (ref >60)
GLUCOSE: 155 mg/dL — AB (ref 65–99)
Potassium: 3.1 mmol/L — ABNORMAL LOW (ref 3.5–5.1)
Sodium: 136 mmol/L (ref 135–145)
TOTAL PROTEIN: 7.3 g/dL (ref 6.5–8.1)

## 2017-05-03 LAB — LIPASE, BLOOD: Lipase: 65 U/L — ABNORMAL HIGH (ref 11–51)

## 2017-05-03 MED ORDER — HALOPERIDOL LACTATE 5 MG/ML IJ SOLN
INTRAMUSCULAR | Status: AC
  Filled 2017-05-03: qty 1

## 2017-05-03 MED ORDER — PANTOPRAZOLE SODIUM 40 MG IV SOLR
INTRAVENOUS | Status: AC
  Filled 2017-05-03: qty 40

## 2017-05-03 MED ORDER — DIPHENHYDRAMINE HCL 50 MG/ML IJ SOLN
INTRAMUSCULAR | Status: AC
  Filled 2017-05-03: qty 1

## 2017-05-03 MED ORDER — DIPHENHYDRAMINE HCL 50 MG/ML IJ SOLN
12.5000 mg | INTRAMUSCULAR | Status: AC
Start: 2017-05-03 — End: 2017-05-03
  Administered 2017-05-03: 12.5 mg via INTRAVENOUS

## 2017-05-03 MED ORDER — HALOPERIDOL LACTATE 5 MG/ML IJ SOLN
5.0000 mg | Freq: Once | INTRAMUSCULAR | Status: AC
  Administered 2017-05-03: 5 mg via INTRAVENOUS

## 2017-05-03 MED ORDER — PANTOPRAZOLE SODIUM 40 MG IV SOLR
40.0000 mg | Freq: Once | INTRAVENOUS | Status: AC
  Administered 2017-05-03: 40 mg via INTRAVENOUS

## 2017-05-03 NOTE — ED Notes (Signed)
ED Provider at bedside. 

## 2017-05-03 NOTE — ED Notes (Signed)
Pt states this happens approx every 6 months, CT scan, states does not recall dx.

## 2017-05-03 NOTE — ED Provider Notes (Signed)
[facility] Emergency Department Provider Note  ____________________________________________   None    (approximate)  I have reviewed the triage vital signs and the nursing notes.   HISTORY  Chief Complaint Abdominal Pain    HPI Dean Boyd is a 37 y.o. male with a medical history that includes multiple visits to this emergency department in the past for acute onset nausea, vomiting, diarrhea, and abdominal pain.  He presents tonight with the same symptoms.  He reports that his symptoms started acutely and were immediately severe about 24 hours ago a short time after eating dinner.  He reports at least 5 episodes of vomiting today and 4 or 5 episodes of loose stools.  He was feeling better enough by this afternoon that he donated plasma, which he states he thinks made him feel worse.  After he donated plasma he felt generally weak and lightheaded and continued to have some vomiting and loose stools.  He reports that he has had multiple CT scans that never show any specific problem.  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  He states that he does drink alcohol and drinks two 40-ounce beers frequently, although not every day.  He does state that he has some acid reflux but he takes no medications for it.  He has a wife and 3 children but they have not been ill recently.  He feels bad all over but denies fever, chills, chest pain, shortness of breath, and dysuria.   No past medical history on file.  Patient Active Problem List   Diagnosis Date Noted  . Intractable vomiting with nausea 05/12/2016    No past surgical history on file.  Prior to Admission medications   Medication Sig Start Date End Date Taking? Authorizing Provider  ciprofloxacin (CIPRO) 500 MG tablet Take 1 tablet (500 mg total) by mouth 2 (two) times daily. X 7 more days 05/14/16   Enid Baas, MD    dicyclomine (BENTYL) 20 MG tablet Take 1 tablet (20 mg total) by mouth 3 (three) times daily as needed for spasms. Patient not taking: Reported on 05/12/2016 05/11/16 05/11/17  Darci Current, MD  ondansetron [facility]) 4 MG tablet Take 1-2 tabs by mouth every 8 hours as needed for nausea/vomiting 05/04/17   Loleta Rose, MD  potassium chloride SA (KLOR-CON M20) 20 MEQ tablet Take 1 tablet (20 mEq total) by mouth daily. 05/04/17   Loleta Rose, MD    Allergies Patient has no known allergies.  No family history on file.  Social History Social History   Tobacco Use  . Smoking status: Current Every Day Smoker    Packs/day: 0.50    Types: Cigarettes  . Smokeless tobacco: Never Used  Substance Use Topics  . Alcohol use: No  . Drug use: No    Review of Systems Constitutional: No fever/chills.  Feels generally ill with body aches and generalized weakness Eyes: No visual changes. ENT: No sore throat. Cardiovascular: Denies chest pain. Respiratory: Denies shortness of breath. Gastrointestinal: Abdominal pain, N/V/D as described above Genitourinary: Negative for dysuria. Musculoskeletal: Negative for neck pain.  Negative for back pain. Integumentary: Negative for rash. Neurological: Negative for headaches, focal weakness or numbness.   ____________________________________________   PHYSICAL EXAM:  VITAL SIGNS: ED Triage Vitals [05/03/17 2237]  Enc Vitals Group     BP 135/86     Pulse Rate 62  Resp 18     Temp 99.1 F (37.3 C)     Temp Source Oral     SpO2 98 %     Weight 72.6 kg (160 lb)     Height 1.702 m (5\' 7" )     Head Circumference      Peak Flow      Pain Score 10     Pain Loc      Pain Edu?      Excl. in GC?     Constitutional: Alert and oriented. Well appearing and in no acute distress. Eyes: Conjunctivae are normal.  Head: Atraumatic. Nose: No congestion/rhinnorhea. Mouth/Throat: Mucous membranes are dry Neck: No stridor.  No meningeal signs.    Cardiovascular: Normal rate, regular rhythm. Good peripheral circulation. Grossly normal heart sounds. Respiratory: Normal respiratory effort.  No retractions. Lungs CTAB. Gastrointestinal: Thin body habitus.  Diffuse and mild tenderness throughout the abdomen, diminished with distraction and while the patient is conversing.  No distention, no rebound, no guarding. Musculoskeletal: No lower extremity tenderness nor edema. No gross deformities of extremities. Neurologic:  Normal speech and language. No gross focal neurologic deficits are appreciated.  Skin:  Skin is warm, dry and intact. No rash noted. Psychiatric: Mood and affect are normal. Speech and behavior are normal.  ____________________________________________   LABS (all labs ordered are listed, but only abnormal results are displayed)  Labs Reviewed  CBC WITH DIFFERENTIAL/PLATELET - Abnormal; Notable for the following components:      Result Value   WBC 15.4 (*)    MCHC 31.8 (*)    Neutro Abs 11.9 (*)    All other components within normal limits  COMPREHENSIVE METABOLIC PANEL - Abnormal; Notable for the following components:   Potassium 3.1 (*)    CO2 20 (*)    Glucose, Bld 155 (*)    Calcium 8.6 (*)    All other components within normal limits  LIPASE, BLOOD - Abnormal; Notable for the following components:   Lipase 65 (*)    All other components within normal limits  URINALYSIS, ROUTINE W REFLEX MICROSCOPIC  URINE DRUG SCREEN, QUALITATIVE ([facility] ONLY)   ____________________________________________  EKG  None - EKG not ordered by ED physician ____________________________________________  RADIOLOGY   ED MD interpretation:  No indication for imaging  Official radiology report(s): No results found.  ____________________________________________   PROCEDURES  Critical Care performed: No   Procedure(s) performed:   Procedures   ____________________________________________   INITIAL IMPRESSION /  ASSESSMENT AND PLAN / ED COURSE  As part of my medical decision making, I reviewed the following data within the electronic MEDICAL RECORD NUMBER Nursing notes reviewed and incorporated, Labs reviewed , Old chart reviewed and Notes from prior ED visits  Differential diagnosis includes, but is not limited to, cyclic vomiting syndrome, cannabinoid hyperemesis syndrome, viral gastroenteritis, foodborne infection/pathogen, biliary colic/gallbladder disease, appendicitis, alcoholic gastritis.  I reviewed the patient's notes extensively and he has had at least 6 visits over the last few years with similar presentation and he was admitted once for intractable nausea and vomiting at the time he was marijuana positive in a believed cannabinoid hyperemesis syndrome was the most likely diagnosis, but the patient is insistent that he no longer smokes marijuana and he has had this happen since he stopped.  He is hemodynamically stable but looks very uncomfortable.  Given the nature of cyclic vomiting syndrome and the positive effect demonstrated both anecdotally and in the literature of haloperidol in terms of both symptomatic  improvement as an antiemetic and aborting the gastroenteritis process, I will administer 5 mg of haloperidol injected into a bag of normal saline which is currently bolusing.  I think his symptoms were exacerbated by donating plasma today which further dehydrated him.  I am also going to provide pantoprazole 40 mg IV because he has had reflux in the past and it has been demonstrated on a CT scan in the past.  I am giving a small dose of Benadryl 12.5 mg IV to help prevent any dyskinesia from the haloperidol.  I will anticipate 2 L of fluids and give the patient and she has to rest after the medication and then reassess.  He is a young man that is received multiple CT scans in the past and unless there is a strong indication for the need for repeat imaging I will not plan on obtaining imaging tonight.   Fortunately his abdominal exam is benign with no rebound or guarding and just some diffuse tenderness throughout most consistent with pain from his persistent vomiting.  I explained all this to the patient and he understands and agrees with the plan.  I also already discussed with him the need to follow-up with a GI doctor given the recurrent nature of his symptoms, but the patient has no insurance and this will be difficult for him.   Clinical Course as of May 04 245  Thu May 03, 2017  2309 Mild leukocytosis consistent with his old chart indicating that he typically has a leukocytosis during these types of presentations.  WBC(!): 15.4 [CF]  Fri May 04, 2017  0109 Patient sleeping comfortably   [CF]  0140 Patient continues to rest.  Giving second liter bolus   [CF]  0242 Patient is still sleepy but says that he feels better and is ready to go home.  I will give him a p.o. challenge as well as oral potassium.  I think that his elevated lipase is due to the gastritis and not representative of pancreatitis given the lack of tenderness to palpation in the epigastrium but I will reassess if he has more difficulties with vomiting.   [CF]    Clinical Course User Index [CF] Loleta RoseForbach, Jveon Pound, MD    ____________________________________________  FINAL CLINICAL IMPRESSION(S) / ED DIAGNOSES  Final diagnoses:  Nausea vomiting and diarrhea  Hypokalemia, gastrointestinal losses  Elevated lipase     MEDICATIONS GIVEN DURING THIS VISIT:  Medications  potassium chloride SA (K-DUR,KLOR-CON) CR tablet 40 mEq (has no administration in time range)  pantoprazole (PROTONIX) injection 40 mg (40 mg Intravenous Given 05/03/17 2311)  haloperidol lactate (HALDOL) injection 5 mg (5 mg Intravenous Given 05/03/17 2312)  diphenhydrAMINE (BENADRYL) injection 12.5 mg (12.5 mg Intravenous Given 05/03/17 2311)  sodium chloride 0.9 % bolus 1,000 mL (0 mLs Intravenous Stopped 05/04/17 0242)     ED Discharge Orders         Ordered    potassium chloride SA (KLOR-CON M20) 20 MEQ tablet  Daily     05/04/17 0245    ondansetron (ZOFRAN) 4 MG tablet     05/04/17 0245       Note:  This document was prepared using Dragon voice recognition software and may include unintentional dictation errors.    Loleta RoseForbach, Luwanna Brossman, MD 05/04/17 [phone]

## 2017-05-03 NOTE — ED Triage Notes (Signed)
Pt in with co mid abd pain since yesterday, pt diaphoretic in triage. Co body aches, vomiting x 5 today with some diarrhea.

## 2017-05-04 MED ORDER — POTASSIUM CHLORIDE CRYS ER 20 MEQ PO TBCR: 20.0000 meq | EXTENDED_RELEASE_TABLET | Freq: Every day | ORAL | 0 refills | Status: DC

## 2017-05-04 MED ORDER — SODIUM CHLORIDE 0.9 % IV BOLUS (SEPSIS)
1000.0000 mL | INTRAVENOUS | Status: AC
  Administered 2017-05-04: 1000 mL via INTRAVENOUS

## 2017-05-04 MED ORDER — POTASSIUM CHLORIDE CRYS ER 20 MEQ PO TBCR
40.0000 meq | EXTENDED_RELEASE_TABLET | Freq: Once | ORAL | Status: AC
  Administered 2017-05-04: 40 meq via ORAL
  Filled 2017-05-04: qty 2

## 2017-05-04 MED ORDER — ONDANSETRON HCL 4 MG PO TABS: ORAL_TABLET | ORAL | 0 refills | Status: DC

## 2017-05-04 NOTE — ED Notes (Signed)

## 2017-05-04 NOTE — Discharge Instructions (Signed)

## 2017-05-04 NOTE — ED Notes (Addendum)
Pt provided gingerale and saltines

## 2017-05-04 NOTE — ED Notes (Addendum)
Pt continues to rest peacefully, remains diaphoretic, no emesis since arrival to tx room. VSS, equal rise and fall of chest noted. Pt awoken, states cannot urinate. Will continue to monitor.

## 2017-05-04 NOTE — ED Notes (Signed)
Successful po challenge

## 2017-05-13 ENCOUNTER — Emergency Department: Payer: Self-pay

## 2017-05-13 ENCOUNTER — Encounter: Payer: Self-pay | Admitting: Emergency Medicine

## 2017-05-13 ENCOUNTER — Emergency Department
Admission: EM | Admit: 2017-05-13 | Discharge: 2017-05-13 | Discharge status: Against Medical Advice | Payer: Self-pay | Attending: Emergency Medicine | Admitting: Emergency Medicine

## 2017-05-13 ENCOUNTER — Other Ambulatory Visit: Payer: Self-pay

## 2017-05-13 DIAGNOSIS — R7989 Other specified abnormal findings of blood chemistry: Secondary | ICD-10-CM

## 2017-05-13 DIAGNOSIS — R1084 Generalized abdominal pain: Secondary | ICD-10-CM

## 2017-05-13 DIAGNOSIS — R079 Chest pain, unspecified: Secondary | ICD-10-CM

## 2017-05-13 DIAGNOSIS — Z79899 Other long term (current) drug therapy: Secondary | ICD-10-CM | POA: Insufficient documentation

## 2017-05-13 DIAGNOSIS — R778 Other specified abnormalities of plasma proteins: Secondary | ICD-10-CM

## 2017-05-13 DIAGNOSIS — F17210 Nicotine dependence, cigarettes, uncomplicated: Secondary | ICD-10-CM | POA: Insufficient documentation

## 2017-05-13 DIAGNOSIS — R112 Nausea with vomiting, unspecified: Secondary | ICD-10-CM

## 2017-05-13 DIAGNOSIS — R748 Abnormal levels of other serum enzymes: Secondary | ICD-10-CM | POA: Insufficient documentation

## 2017-05-13 LAB — COMPREHENSIVE METABOLIC PANEL
ALBUMIN: 3.7 g/dL (ref 3.5–5.0)
ALT: 19 U/L (ref 17–63)
ANION GAP: 12 (ref 5–15)
AST: 30 U/L (ref 15–41)
Alkaline Phosphatase: 47 U/L (ref 38–126)
BUN: 8 mg/dL (ref 6–20)
CHLORIDE: 105 mmol/L (ref 101–111)
CO2: 21 mmol/L — AB (ref 22–32)
Calcium: 8.9 mg/dL (ref 8.9–10.3)
Creatinine, Ser: 1.04 mg/dL (ref 0.61–1.24)
GFR calc Af Amer: >60 mL/min (ref >60)
GFR calc non Af Amer: >60 mL/min (ref >60)
GLUCOSE: 137 mg/dL — AB (ref 65–99)
POTASSIUM: 3.6 mmol/L (ref 3.5–5.1)
SODIUM: 138 mmol/L (ref 135–145)
Total Bilirubin: 0.8 mg/dL (ref 0.3–1.2)
Total Protein: 7.1 g/dL (ref 6.5–8.1)

## 2017-05-13 LAB — CBC
HCT: 45.2 % (ref 40.0–52.0)
Hemoglobin: 14.6 g/dL (ref 13.0–18.0)
MCH: 28.8 pg (ref 26.0–34.0)
MCHC: 32.3 g/dL (ref 32.0–36.0)
MCV: 89.3 fL (ref 80.0–100.0)
PLATELETS: 232 10*3/uL (ref 150–440)
RBC: 5.06 MIL/uL (ref 4.40–5.90)
RDW: 13.9 % (ref 11.5–14.5)
WBC: 19.2 10*3/uL — AB (ref 3.8–10.6)

## 2017-05-13 LAB — LIPASE, BLOOD: Lipase: 31 U/L (ref 11–51)

## 2017-05-13 LAB — TROPONIN I: Troponin I: 0.22 ng/mL (ref <0.03)

## 2017-05-13 MED ORDER — ONDANSETRON HCL 4 MG/2ML IJ SOLN
4.0000 mg | Freq: Once | INTRAMUSCULAR | Status: AC
  Administered 2017-05-13: 4 mg via INTRAVENOUS

## 2017-05-13 MED ORDER — ONDANSETRON HCL 4 MG/2ML IJ SOLN
INTRAMUSCULAR | Status: AC
  Filled 2017-05-13: qty 2

## 2017-05-13 MED ORDER — SODIUM CHLORIDE 0.9 % IV BOLUS
1000.0000 mL | INTRAVENOUS | Status: AC
  Administered 2017-05-13: 1000 mL via INTRAVENOUS

## 2017-05-13 MED ORDER — DIPHENHYDRAMINE HCL 50 MG/ML IJ SOLN
12.5000 mg | INTRAMUSCULAR | Status: AC
  Administered 2017-05-13: 12.5 mg via INTRAVENOUS
  Filled 2017-05-13: qty 1

## 2017-05-13 MED ORDER — IOHEXOL 350 MG/ML SOLN
75.0000 mL | Freq: Once | INTRAVENOUS | Status: AC | PRN
  Administered 2017-05-13: 75 mL via INTRAVENOUS

## 2017-05-13 MED ORDER — PANTOPRAZOLE SODIUM 40 MG IV SOLR
40.0000 mg | Freq: Once | INTRAVENOUS | Status: AC
  Administered 2017-05-13: 40 mg via INTRAVENOUS
  Filled 2017-05-13: qty 40

## 2017-05-13 MED ORDER — HALOPERIDOL LACTATE 5 MG/ML IJ SOLN
5.0000 mg | Freq: Once | INTRAMUSCULAR | Status: AC
  Administered 2017-05-13: 5 mg via INTRAVENOUS
  Filled 2017-05-13: qty 1

## 2017-05-13 NOTE — ED Notes (Signed)
Pt advised by Dr York CeriseForbach and this nurse of the dangers of leaving AMA - he was advised that this could end with death if he were to be having a heart attack - pt refused to stay and stated he was going home - pt refused to have discharge vital signs

## 2017-05-13 NOTE — ED Triage Notes (Signed)
FIRST NURSE NOTE-here for CP/ diaphoretic.

## 2017-05-13 NOTE — ED Notes (Signed)
Pt is demanding to go home - Dr York CeriseForbach notified and in room trying to convince the pt to stay in the ED

## 2017-05-13 NOTE — ED Notes (Addendum)
Pt states he is having chest pain that started last pm - pt refuses to wake up and talk to nurse - even though he peaks out his eyes to see if I am still in the room

## 2017-05-13 NOTE — ED Triage Notes (Signed)
Pt c/o chest pain that started last night. Pt states that the pain is on the left side of his chest and in his stomach. Pt states that he has had N/V. Pt denies fever.

## 2017-05-13 NOTE — ED Notes (Signed)
Elevated troponin of 0.22 reported to Dr York CeriseForbach - no new orders given at this time

## 2017-05-13 NOTE — ED Provider Notes (Signed)
[facility] Emergency Department Provider Note  ____________________________________________   First MD Initiated Contact with Patient 05/13/17 1228     (approximate)  I have reviewed the triage vital signs and the nursing notes.   HISTORY  Chief Complaint Chest Pain    HPI Dean Boyd is a 37 y.o. male with a history of multiple episodes of intractable nausea and vomiting and abdominal pain thought to be either cannabinoid hyperemesis syndrome or cyclic vomiting.  He presents for evaluation of chest pain as well as abdominal pain.  The pain reportedly started in the left side of his chest and radiates down into the stomach or vice versa.  He has had multiple episodes of vomiting.  The symptoms started about 12 hours ago and has been persistent.  He describes them as severe.  He is minimally communicative with myself and the nursing staff and appears to be in a moderate amount of distress.  The presentation is very similar to 10 days ago when I saw the patient for essentially the same symptoms, except at this time he is complaining of chest pain and not just the abdominal pain.  He admits to recent marijuana use.  He states that he had stopped since his last visit until last night when he smoked again, saying "even though I knew I shouldn't."  He says the pain is all throughout his abdomen instead of in just one spot.  Everything makes it worse and nothing makes it better.  He seems angry and does not want to provide any history and states that he just wants this to stop happening to him.  History reviewed. No pertinent past medical history.  Patient Active Problem List   Diagnosis Date Noted  . Intractable vomiting with nausea 05/12/2016    History reviewed. No pertinent surgical history.  Prior to Admission medications   Medication Sig Start Date End Date Taking? Authorizing Provider  ciprofloxacin (CIPRO) 500 MG tablet Take 1 tablet (500 mg total) by  mouth 2 (two) times daily. X 7 more days 05/14/16   Enid Baas, MD  dicyclomine (BENTYL) 20 MG tablet Take 1 tablet (20 mg total) by mouth 3 (three) times daily as needed for spasms. Patient not taking: Reported on 05/12/2016 05/11/16 05/11/17  Darci Current, MD  ondansetron [facility] 4 MG tablet Take 1-2 tabs by mouth every 8 hours as needed for nausea/vomiting 05/04/17   Loleta Rose, MD  potassium chloride SA (KLOR-CON M20) 20 MEQ tablet Take 1 tablet (20 mEq total) by mouth daily. 05/04/17   Loleta Rose, MD    Allergies Patient has no known allergies.  No family history on file.  Social History Social History   Tobacco Use  . Smoking status: Current Every Day Smoker    Packs/day: 0.50    Types: Cigarettes  . Smokeless tobacco: Never Used  Substance Use Topics  . Alcohol use: No  . Drug use: No    Review of Systems Constitutional: No fever/chills Eyes: No visual changes. ENT: No sore throat. Cardiovascular: Chest pain as described above Respiratory: Denies shortness of breath. Gastrointestinal: Abdominal pain, nausea, and vomiting as described above Genitourinary: Negative for dysuria. Musculoskeletal: Negative for neck pain.  Negative for back pain. Integumentary: Negative for rash. Neurological: Negative for headaches, focal weakness or numbness.   ____________________________________________   PHYSICAL EXAM:  VITAL SIGNS: ED Triage Vitals [05/13/17 1154]  Enc Vitals Group     BP (!) 153/101     Pulse Rate 71  Resp 16     Temp (!) 97.5 F (36.4 C)     Temp Source Oral     SpO2 100 %     Weight 68 kg (150 lb)     Height      Head Circumference      Peak Flow      Pain Score 10     Pain Loc      Pain Edu?      Excl. in GC?     Constitutional: Alert, minimally communicative with [place], appears to be in moderate distress Eyes: Conjunctivae are normal.  Head: Atraumatic. Nose: No congestion/rhinnorhea. Mouth/Throat: Mucous membranes are  moist. Neck: No stridor.  No meningeal signs.   Cardiovascular: Normal rate, regular rhythm. Good peripheral circulation. Grossly normal heart sounds. Respiratory: Normal respiratory effort.  No retractions. Lungs CTAB. Gastrointestinal: Thin body habitus.  Soft with diffuse tenderness throughout.  No pulsatile abdominal mass, no bruit. Musculoskeletal: No lower extremity tenderness nor edema. No gross deformities of extremities. Neurologic:  Normal speech and language. No gross focal neurologic deficits are appreciated.  Skin:  Skin is warm, dry and intact. No rash noted. Psychiatric: The patient is minimally cooperative and angry, seems uncomfortable but is also not willing to provide much information and only unwillingly answers my questions.  ____________________________________________   LABS (all labs ordered are listed, but only abnormal results are displayed)  Labs Reviewed  CBC - Abnormal; Notable for the following components:      Result Value   WBC 19.2 (*)    All other components within normal limits  COMPREHENSIVE METABOLIC PANEL - Abnormal; Notable for the following components:   CO2 21 (*)    Glucose, Bld 137 (*)    All other components within normal limits  TROPONIN I - Abnormal; Notable for the following components:   Troponin I 0.22 (*)    All other components within normal limits  LIPASE, BLOOD   ____________________________________________  EKG  ED ECG REPORT I, Loleta Rose, the attending physician, personally viewed and interpreted this ECG.  Date: 05/13/2017 EKG Time: 11:52 AM Rate: 72 Rhythm: normal sinus rhythm with borderline left ventricular hypertrophy QRS Axis: normal Intervals: normal ST/T Wave abnormalities: The patient has minimal (1 mm) ST segment elevation in lead II and possibly in lead III but overall his EKG appears more consistent with early repolarization than with ACS  Narrative Interpretation: Possible acute ischemia versus early  repolarization   ____________________________________________  RADIOLOGY I, Loleta Rose, personally viewed and evaluated these images (plain radiographs) as part of my medical decision making, as well as reviewing the written report by the radiologist.   ED MD interpretation: No evidence of acute abnormalities on chest x-ray.  Official radiology report(s): Dg Chest 2 View  Result Date: 05/13/2017 CLINICAL DATA:  Acute chest pain for 1 day. EXAM: CHEST - 2 VIEW COMPARISON:  05/10/2016 and prior chest radiographs FINDINGS: The cardiomediastinal silhouette is unremarkable. There is no evidence of focal airspace disease, pulmonary edema, suspicious pulmonary nodule/mass, pleural effusion, or pneumothorax. No acute bony abnormalities are identified. IMPRESSION: No active cardiopulmonary disease. Electronically Signed   By: Harmon Pier M.D.   On: 05/13/2017 13:13   Ct Angio Chest/abd/pel For Dissection W And/or W/wo  Result Date: 05/13/2017 CLINICAL DATA:  Chest pain starting last night. Nausea and vomiting. Elevated troponins. EXAM: CT ANGIOGRAPHY CHEST, ABDOMEN AND PELVIS TECHNIQUE: Multidetector CT imaging through the chest, abdomen and pelvis was performed using the standard protocol during bolus administration  of intravenous contrast. Multiplanar reconstructed images and MIPs were obtained and reviewed to evaluate the vascular anatomy. CONTRAST:  75mL OMNIPAQUE IOHEXOL 350 MG/ML SOLN COMPARISON:  Plain films of earlier today chest. Abdominopelvic CT 05/10/2016. FINDINGS: CTA CHEST FINDINGS Cardiovascular: No intramural hematoma on noncontrast imaging. Bovine arch. Normal aortic caliber, without evidence of dissection. No central pulmonary embolism, on this non-dedicated study. Normal heart size, without pericardial effusion. Subtle hypoattenuation or hypoenhancement involving the left ventricular free wall, including on image 64/5. Mediastinum/Nodes: No mediastinal or hilar adenopathy. Tiny hiatal  hernia. Lungs/Pleura: No pleural fluid.  Clear lungs. Musculoskeletal: No acute osseous abnormality. Review of the MIP images confirms the above findings. CTA ABDOMEN AND PELVIS FINDINGS VASCULAR Aorta: Normal in caliber, without dissection. Aortic and branch vessel atherosclerosis. Celiac: Widely patent. SMA: No stenosis. Renals: Single renal arteries bilaterally.  No significant stenosis. IMA: Patent proximally. Inflow: Within normal limits. Veins: Not well evaluated secondary to bolus timing Review of the MIP images confirms the above findings. NON-VASCULAR Hepatobiliary: Normal liver. Normal gallbladder, without biliary ductal dilatation. Pancreas: Normal, without mass or ductal dilatation. Spleen: Normal in size, without focal abnormality. Adrenals/Urinary Tract: Normal adrenal glands. Normal kidneys, without hydronephrosis. Normal urinary bladder. Stomach/Bowel: Normal stomach, without wall thickening. Normal colon, appendix, and terminal ileum. Mild motion and EKG wire/lead artifact, primarily involving the abdomen and upper pelvis. Normal small bowel. Lymphatic: No abdominopelvic adenopathy. Reproductive: Normal prostate. Other: No significant free fluid.  No free intraperitoneal air. Musculoskeletal: No acute osseous abnormality. Review of the MIP images confirms the above findings. IMPRESSION: 1. No evidence of aortic aneurysm or dissection. Aortic Atherosclerosis (ICD10-I70.0). This is significantly age advanced. 2. Subtle hypoattenuation or hypoenhancement involving the left ventricular free wall. Appearance is nonspecific. Given the clinical history of elevated troponins, acute cardiac ischemia cannot be excluded. 3. No other explanation for chest pain. 4. Mild artifact involving the abdomen. Electronically Signed   By: Jeronimo GreavesKyle  Talbot M.D.   On: 05/13/2017 14:45    ____________________________________________   PROCEDURES  Critical Care performed: No   Procedure(s) performed:    Procedures   ____________________________________________   INITIAL IMPRESSION / ASSESSMENT AND PLAN / ED COURSE  As part of my medical decision making, I reviewed the following data within the electronic MEDICAL RECORD NUMBER Nursing notes reviewed and incorporated, Labs reviewed , Old chart reviewed and Notes from prior ED visits    Differential diagnosis includes, but is not limited to, cannabinoid hyperemesis syndrome/cyclic vomiting syndrome, ACS, aortic dissection, pulmonary embolism, cardiac tamponade, pneumothorax, pneumonia, pericarditis, myocarditis, other GI-related causes including esophagitis/gastritis, and musculoskeletal chest wall pain.    The patient is presenting very similarly to the last time I saw him 10 days ago.  He reports that he just smoked marijuana last night for the first time since that last visit and the onset of symptoms occurred after he smoked.  This is atypical of cannabinoid hyperemesis syndrome but still is reflective of the constellation of symptoms.  Chest x-ray is reassuring with no evidence of free air or subcutaneous emphysema.  I will evaluate with lab work but I am also going to proceed with the same treatment that helped him significantly last time: Haldol 5 mg IV injected into 1 L of normal saline in bolus over an hour as well as Benadryl 12.5 mg IV and pantoprazole 40 mg IV.  I will then reassess.  Clinical Course as of May 13 1509  Sun May 13, 2017  1330 Troponin is moderately elevated at 0.22.  This was an unexpected finding given the history of similar symptoms that are suggestive of cannabinoid hyperemesis or cyclic vomiting syndrome.  I had another discussion with the patient and ask him to please be honest with me about any other drug use besides marijuana and he insists that he has not used any cocaine or other stimulant.  He continues to report pain in his chest as well as his abdomen and pelvis, and all of the pain is in the middle.  Given the  elevated troponin and the severity of the symptoms he is reporting, I feel it is necessary for me to rule out the possibility of aortic dissection and I have ordered CT angiography for further evaluation.  His creatinine is normal and in fact his metabolic panel in general is essentially normal.  He has a leukocytosis of 19.2, but he typically does have a leukocytosis when he presents to the emergency department with the abdominal pain, nausea, and vomiting.  A liter of IV fluids is going and I am continuing with my initial treatment of Haldol injected into the IV fluids.Based on his elevated troponin I already ask him if he would be willing to stay in the hospital for further evaluation and it is entirely unclear that he would do so.  He made it very clear, somewhat angrily, that he does not want to stay in the hospital and that he is tired of having these episodes.  At this time I agreed to do the additional evaluation, treat his symptoms, and I will at a minimum reassess with a repeat troponin to see if it is going up or coming down after fluids and after we rule out dissection.  Troponin I(!!): 0.22 [CF]  1444 I was alerted by the patient's nurse to the fact that the patient was attempting to walk out of the emergency department.  He still had his peripheral IV in place.  I asked him to come back to the room so that we could talk about it.  He states that he is tired, he just wants to go home and rest, and he does not want to be here any longer.  I reiterated to him the elevated troponin and and its implications and I explained that if we do not identify an emergent cause of his symptoms and his lab abnormalities that could result in permanent disability or even death.  He says he understands but he does not want to be here anymore and he is demanding to leave immediately.He has received the majority of his IV fluid bolus which included the Haldol, but he is completely awake, alert, seems to feel better than  he did earlier, is no longer vomiting, is communicative, ambulatory without any difficulty, and in my opinion he does have the capacity to make his own decisions.  I reiterated that this is a potentially life-threatening abnormality that needs to be further investigated but he is unwilling to stay.  I do not feel it is within my purview to place him under involuntary commitment given that he appears to have the capacity to make his own decisions so I explained to him that he can return to the emergency department at any time and I asked that he please do so with any worsening of his symptoms.  He says that he understands.  He refused to stay for paperwork.   [CF]  1458 I spoke by phone with the radiologist, Dr. Reche Dixon, to discuss the results of the CTA chest.  He said that the subtle hypoattenuation of the left ventricular free wall is very subtle and that he would not have commented on it in the absence of the reported elevated troponin.  He feels that it is not at all strongly indicative of an acute MI but the patient does also have significant and advanced atherosclerosis for his age.  Bottom line is that it is not necessarily indicative of ACS but it cannot be ruled out either.I attempted to call the patient back to let him know of his abnormal CT findings and encouraged him to return immediately to the emergency department to resume our evaluation and treatment and likely be admitted for further cardiac workup, but there was no answer at the patient's phone number.  I do not feel there is anything else I can do, as I already gave him strict warnings including the possibility of permanent disability or death and he made the decision to leave.  I did inform the charge nurse to let the front desk know that if he returns he should be brought back immediately to continue his workup.   [CF]  1510 The patient called me back and I informed him about the slightly abnormal CT scan and reiterated my precautions  regarding abnormal troponin and the possibility of disability or death.  I encouraged him to come back.  It was unclear based on his response whether he plans to do so or not.  He says that he had gotten in the bath once he got home and was feeling better.  He understands my recommendations and if he chooses to come back should be further evaluated with a repeat troponin at a minimum.   [CF]    Clinical Course User Index [CF] Loleta Rose, MD    ____________________________________________  FINAL CLINICAL IMPRESSION(S) / ED DIAGNOSES  Final diagnoses:  Chest pain, unspecified type  Generalized abdominal pain  Nausea and vomiting, intractability of vomiting not specified, unspecified vomiting type  Elevated troponin I level     MEDICATIONS GIVEN DURING THIS VISIT:  Medications  ondansetron (ZOFRAN) injection 4 mg (4 mg Intravenous Given 05/13/17 1235)  haloperidol lactate (HALDOL) injection 5 mg (5 mg Intravenous Given 05/13/17 1315)  diphenhydrAMINE (BENADRYL) injection 12.5 mg (12.5 mg Intravenous Given 05/13/17 1315)  pantoprazole (PROTONIX) injection 40 mg (40 mg Intravenous Given 05/13/17 1314)  sodium chloride 0.9 % bolus 1,000 mL (0 mLs Intravenous Stopped 05/13/17 1434)  iohexol (OMNIPAQUE) 350 MG/ML injection 75 mL (75 mLs Intravenous Contrast Given 05/13/17 1410)     ED Discharge Orders    None       Note:  This document was prepared using Dragon voice recognition software and may include unintentional dictation errors.    Loleta Rose, MD 05/13/17 [phone]

## 2017-05-16 ENCOUNTER — Encounter: Payer: Self-pay | Admitting: Emergency Medicine

## 2017-05-16 ENCOUNTER — Inpatient Hospital Stay (HOSPITAL_COMMUNITY)
Admit: 2017-05-16 | Discharge: 2017-05-16 | Disposition: A | Discharge status: Home / Self Care | Payer: Self-pay | Attending: Emergency Medicine | Admitting: Emergency Medicine

## 2017-05-16 ENCOUNTER — Other Ambulatory Visit: Payer: Self-pay

## 2017-05-16 ENCOUNTER — Emergency Department: Payer: Self-pay

## 2017-05-16 ENCOUNTER — Inpatient Hospital Stay
Admission: EM | Admit: 2017-05-16 | Discharge: 2017-05-18 | DRG: 282 | Disposition: A | Discharge status: Home / Self Care | Payer: Self-pay | Attending: Internal Medicine | Admitting: Internal Medicine

## 2017-05-16 DIAGNOSIS — F1010 Alcohol abuse, uncomplicated: Secondary | ICD-10-CM | POA: Diagnosis present

## 2017-05-16 DIAGNOSIS — F17210 Nicotine dependence, cigarettes, uncomplicated: Secondary | ICD-10-CM | POA: Diagnosis present

## 2017-05-16 DIAGNOSIS — E876 Hypokalemia: Secondary | ICD-10-CM | POA: Diagnosis not present

## 2017-05-16 DIAGNOSIS — R112 Nausea with vomiting, unspecified: Secondary | ICD-10-CM | POA: Diagnosis present

## 2017-05-16 DIAGNOSIS — I214 Non-ST elevation (NSTEMI) myocardial infarction: Secondary | ICD-10-CM

## 2017-05-16 DIAGNOSIS — I25119 Atherosclerotic heart disease of native coronary artery with unspecified angina pectoris: Secondary | ICD-10-CM | POA: Diagnosis present

## 2017-05-16 DIAGNOSIS — I2119 ST elevation (STEMI) myocardial infarction involving other coronary artery of inferior wall: Principal | ICD-10-CM | POA: Diagnosis present

## 2017-05-16 DIAGNOSIS — I213 ST elevation (STEMI) myocardial infarction of unspecified site: Secondary | ICD-10-CM

## 2017-05-16 DIAGNOSIS — R079 Chest pain, unspecified: Secondary | ICD-10-CM | POA: Diagnosis present

## 2017-05-16 DIAGNOSIS — F1210 Cannabis abuse, uncomplicated: Secondary | ICD-10-CM | POA: Diagnosis present

## 2017-05-16 DIAGNOSIS — Z79899 Other long term (current) drug therapy: Secondary | ICD-10-CM

## 2017-05-16 DIAGNOSIS — F1910 Other psychoactive substance abuse, uncomplicated: Secondary | ICD-10-CM

## 2017-05-16 DIAGNOSIS — I2510 Atherosclerotic heart disease of native coronary artery without angina pectoris: Secondary | ICD-10-CM

## 2017-05-16 DIAGNOSIS — I340 Nonrheumatic mitral (valve) insufficiency: Secondary | ICD-10-CM

## 2017-05-16 HISTORY — DX: Cannabis abuse, uncomplicated: F12.10

## 2017-05-16 HISTORY — DX: Tobacco use: Z72.0

## 2017-05-16 HISTORY — DX: Atherosclerotic heart disease of native coronary artery without angina pectoris: I25.10

## 2017-05-16 HISTORY — DX: Alcohol abuse, uncomplicated: F10.10

## 2017-05-16 HISTORY — DX: Nausea with vomiting, unspecified: R11.2

## 2017-05-16 LAB — URINALYSIS, COMPLETE (UACMP) WITH MICROSCOPIC
Bacteria, UA: NONE SEEN
Bilirubin Urine: NEGATIVE
Glucose, UA: NEGATIVE mg/dL
Hgb urine dipstick: NEGATIVE
Ketones, ur: 20 mg/dL — AB
Leukocytes, UA: NEGATIVE
Nitrite: NEGATIVE
Protein, ur: 30 mg/dL — AB
SPECIFIC GRAVITY, URINE: 1.023 (ref 1.005–1.030)
pH: 5 (ref 5.0–8.0)

## 2017-05-16 LAB — ECHOCARDIOGRAM COMPLETE
Height: 67 in
Weight: 2400 oz

## 2017-05-16 LAB — COMPREHENSIVE METABOLIC PANEL
ALK PHOS: 51 U/L (ref 38–126)
ALT: 33 U/L (ref 17–63)
AST: 72 U/L — ABNORMAL HIGH (ref 15–41)
Albumin: 3.8 g/dL (ref 3.5–5.0)
Anion gap: 14 (ref 5–15)
BUN: 12 mg/dL (ref 6–20)
CALCIUM: 9.1 mg/dL (ref 8.9–10.3)
CO2: 22 mmol/L (ref 22–32)
CREATININE: 1.31 mg/dL — AB (ref 0.61–1.24)
Chloride: 100 mmol/L — ABNORMAL LOW (ref 101–111)
GFR calc Af Amer: >60 mL/min (ref >60)
Glucose, Bld: 110 mg/dL — ABNORMAL HIGH (ref 65–99)
Potassium: 4.4 mmol/L (ref 3.5–5.1)
SODIUM: 136 mmol/L (ref 135–145)
Total Bilirubin: 2 mg/dL — ABNORMAL HIGH (ref 0.3–1.2)
Total Protein: 7.9 g/dL (ref 6.5–8.1)

## 2017-05-16 LAB — CBC WITH DIFFERENTIAL/PLATELET
BASOS ABS: 0 10*3/uL (ref 0–0.1)
Basophils Relative: 0 %
EOS PCT: 0 %
Eosinophils Absolute: 0 10*3/uL (ref 0–0.7)
HCT: 46.7 % (ref 40.0–52.0)
Hemoglobin: 14.8 g/dL (ref 13.0–18.0)
LYMPHS ABS: 2 10*3/uL (ref 1.0–3.6)
LYMPHS PCT: 10 %
MCH: 28.5 pg (ref 26.0–34.0)
MCHC: 31.6 g/dL — AB (ref 32.0–36.0)
MCV: 90.1 fL (ref 80.0–100.0)
MONO ABS: 1.5 10*3/uL — AB (ref 0.2–1.0)
Monocytes Relative: 7 %
Neutro Abs: 17.4 10*3/uL — ABNORMAL HIGH (ref 1.4–6.5)
Neutrophils Relative %: 83 %
PLATELETS: 244 10*3/uL (ref 150–440)
RBC: 5.19 MIL/uL (ref 4.40–5.90)
RDW: 13.7 % (ref 11.5–14.5)
WBC: 21 10*3/uL — ABNORMAL HIGH (ref 3.8–10.6)

## 2017-05-16 LAB — TROPONIN I
TROPONIN I: 13.52 ng/mL — AB (ref <0.03)
TROPONIN I: 9.37 ng/mL — AB (ref <0.03)
Troponin I: 12.21 ng/mL (ref <0.03)
Troponin I: 13.77 ng/mL (ref <0.03)

## 2017-05-16 LAB — ETHANOL: Alcohol, Ethyl (B): <10 mg/dL (ref <10)

## 2017-05-16 LAB — URINE DRUG SCREEN, QUALITATIVE (ARMC ONLY)
Amphetamines, Ur Screen: NOT DETECTED
Barbiturates, Ur Screen: NOT DETECTED
Benzodiazepine, Ur Scrn: NOT DETECTED
Cannabinoid 50 Ng, Ur ~~LOC~~: POSITIVE — AB
Cocaine Metabolite,Ur ~~LOC~~: NOT DETECTED
MDMA (ECSTASY) UR SCREEN: NOT DETECTED
METHADONE SCREEN, URINE: NOT DETECTED
Opiate, Ur Screen: NOT DETECTED
Phencyclidine (PCP) Ur S: NOT DETECTED
TRICYCLIC, UR SCREEN: NOT DETECTED

## 2017-05-16 LAB — BRAIN NATRIURETIC PEPTIDE: B NATRIURETIC PEPTIDE 5: 228 pg/mL — AB (ref 0.0–100.0)

## 2017-05-16 LAB — PROTIME-INR
INR: 1.01
PROTHROMBIN TIME: 13.2 s (ref 11.4–15.2)

## 2017-05-16 LAB — MAGNESIUM: MAGNESIUM: 1.7 mg/dL (ref 1.7–2.4)

## 2017-05-16 LAB — HEPARIN LEVEL (UNFRACTIONATED)

## 2017-05-16 LAB — LIPASE, BLOOD: Lipase: 22 U/L (ref 11–51)

## 2017-05-16 LAB — APTT: APTT: 29 s (ref 24–36)

## 2017-05-16 LAB — TSH: TSH: 1.828 u[IU]/mL (ref 0.350–4.500)

## 2017-05-16 MED ORDER — ONDANSETRON HCL 4 MG/2ML IJ SOLN: 4.0000 mg | Freq: Four times a day (QID) | INTRAMUSCULAR | Status: DC | PRN

## 2017-05-16 MED ORDER — HALOPERIDOL LACTATE 5 MG/ML IJ SOLN
5.0000 mg | Freq: Once | INTRAMUSCULAR | Status: AC
  Administered 2017-05-16: 5 mg via INTRAVENOUS
  Filled 2017-05-16: qty 1

## 2017-05-16 MED ORDER — TRAMADOL HCL 50 MG PO TABS: 50.0000 mg | ORAL_TABLET | Freq: Four times a day (QID) | ORAL | Status: DC | PRN

## 2017-05-16 MED ORDER — SODIUM CHLORIDE 0.9 % IV BOLUS
1000.0000 mL | Freq: Once | INTRAVENOUS | Status: AC
  Administered 2017-05-16: 1000 mL via INTRAVENOUS

## 2017-05-16 MED ORDER — HEPARIN (PORCINE) IN NACL 100-0.45 UNIT/ML-% IJ SOLN
1250.0000 [IU]/h | INTRAMUSCULAR | Status: DC
  Administered 2017-05-16: 800 [IU]/h via INTRAVENOUS
  Administered 2017-05-16: 1000 [IU]/h via INTRAVENOUS
  Filled 2017-05-16 (×2): qty 250

## 2017-05-16 MED ORDER — SODIUM CHLORIDE 0.9 % IV SOLN
INTRAVENOUS | Status: DC
  Administered 2017-05-16: 13:00:00 via INTRAVENOUS

## 2017-05-16 MED ORDER — HALOPERIDOL LACTATE 5 MG/ML IJ SOLN: 2.5000 mg | Freq: Once | INTRAMUSCULAR | Status: DC

## 2017-05-16 MED ORDER — HEPARIN BOLUS VIA INFUSION
4000.0000 [IU] | Freq: Once | INTRAVENOUS | Status: AC
  Administered 2017-05-16: 4000 [IU] via INTRAVENOUS
  Filled 2017-05-16: qty 4000

## 2017-05-16 MED ORDER — ASPIRIN 81 MG PO CHEW
162.0000 mg | CHEWABLE_TABLET | Freq: Once | ORAL | Status: AC
  Administered 2017-05-16: 162 mg via ORAL
  Filled 2017-05-16: qty 2

## 2017-05-16 MED ORDER — ASPIRIN EC 81 MG PO TBEC
81.0000 mg | DELAYED_RELEASE_TABLET | Freq: Every day | ORAL | Status: DC
  Administered 2017-05-17: 81 mg via ORAL
  Filled 2017-05-16 (×2): qty 1

## 2017-05-16 MED ORDER — HEPARIN BOLUS VIA INFUSION
2100.0000 [IU] | Freq: Once | INTRAVENOUS | Status: AC
  Administered 2017-05-16: 2100 [IU] via INTRAVENOUS
  Filled 2017-05-16: qty 2100

## 2017-05-16 MED ORDER — ACETAMINOPHEN 325 MG PO TABS: 650.0000 mg | ORAL_TABLET | Freq: Four times a day (QID) | ORAL | Status: DC | PRN

## 2017-05-16 MED ORDER — ATORVASTATIN CALCIUM 80 MG PO TABS
80.0000 mg | ORAL_TABLET | Freq: Every day | ORAL | Status: DC
  Administered 2017-05-16: 80 mg via ORAL
  Filled 2017-05-16: qty 1
  Filled 2017-05-16: qty 4
  Filled 2017-05-16: qty 1

## 2017-05-16 MED ORDER — ACETAMINOPHEN 650 MG RE SUPP: 650.0000 mg | Freq: Four times a day (QID) | RECTAL | Status: DC | PRN

## 2017-05-16 MED ORDER — ONDANSETRON HCL 4 MG PO TABS: 4.0000 mg | ORAL_TABLET | Freq: Four times a day (QID) | ORAL | Status: DC | PRN

## 2017-05-16 NOTE — Progress Notes (Signed)
ANTICOAGULATION CONSULT NOTE - Initial Consult  Pharmacy Consult for Heparin Drip Indication: chest pain/ACS and NSTEMI  No Known Allergies  Patient Measurements: Height: 5\' 7"  (170.2 cm) Weight: 150 lb (68 kg) IBW/kg (Calculated) : 66.1 Heparin Dosing Weight: 68kg  Vital Signs: Temp: 98.2 F (36.8 C) (04/03 0802) Temp Source: Oral (04/03 0802) BP: 107/69 (04/03 0950) Pulse Rate: 68 (04/03 0950)  Labs: Recent Labs    05/13/17 1224 05/16/17 0840  HGB 14.6 14.8  HCT 45.2 46.7  PLT 232 244  CREATININE 1.04 1.31*  TROPONINI 0.22* 12.21*    Estimated Creatinine Clearance: 72.9 mL/min (A) (by C-G formula based on SCr of 1.31 mg/dL (H)).   Medical History: History reviewed. No pertinent past medical history.   Assessment: Patient is a 37yo male admitted for chest pain. The patient either has myocarditis vs. NSTEMI. Pharmacy consulted for Heparin dosing. Reviewed prior meds and no history of prior anticoagulant.  Goal of Therapy:  Heparin level 0.3-0.7 units/ml Monitor platelets by anticoagulation protocol: Yes   Plan:  Give 4000 units bolus x 1 Start heparin infusion at 800 units/hr Check anti-Xa level in 6 hours and daily while on heparin Continue to monitor H&H and platelets  Clovia CuffLisa Salam [place], PharmD, BCPS 05/16/2017 10:27 AM

## 2017-05-16 NOTE — Progress Notes (Signed)
Talked to Dr. Allena KatzPatel about patient requesting to eat regular food, patient in on clear liquid for N&V, not complaining of nausea now, place on heart healthy diet. RN will continue to monitor.

## 2017-05-16 NOTE — Consult Note (Signed)
Cardiology Consult    Patient ID: Dean Boyd MRN: [id number], DOB/AGE: 36/17/1982   Admit date: 05/16/2017 Date of Consult: 05/16/2017  Primary Physician: Patient, No Pcp Per Primary Cardiologist: Yvonne Kendall, MD  - new Requesting Provider: S. Allena Katz, MD  Patient Profile    Dean Boyd is a 37 y.o. male with a history of polysubstance abuse and recurrent n/v, who is being seen today for the evaluation of chest pain and nstemi at the request of Dr. Allena Katz.  Past Medical History   Past Medical History:  Diagnosis Date  . Alcohol abuse   . Marijuana abuse   . Nausea and vomiting   . Tobacco abuse     History reviewed. No pertinent surgical history.   Allergies  No Known Allergies  History of Present Illness    37 year old male with the above past medical history including alcohol, marijuana, and tobacco abuse.  He lives locally with his wife and children.  He does not routine exercise and is currently unemployed.  He has no prior cardiac history but does have a history of premature heart disease, with his mother developing heart failure in her mid 77s.  He is not sure if she has coronary artery disease.  He reports a history of intermittent nausea and vomiting that dates back to at least November 2016 and based on the chief complaints noted in the ER notes.  In April 2018, he was admitted to [facility] with intractable nausea and vomiting with abdominal pain.  He wwas found to have enteroaggregative E coli and was treated with Cipro with improvement in symptoms.  He was subsequently discharged.  He changed his smoking and marijuana habits following that and only rarely noted nausea and vomiting over the past 11 months.    In late March 2019, he had recurrent severe nausea and vomiting prompting ER presentation.  It was felt the presentation was secondary to cannabinoid hyperemesis syndrome.  He was treated in the emergency department and subsequently discharged.   Patient says that following that episode, he stopped smoking marijuana cigarettes instead started vaping THC.  He continued to have intermittent nausea and vomiting and on the evening of March 30, he had recurrent nausea and vomiting associated with chest and abdominal pain.  He presented to the emergency department early on March 31.  Initial ECG showed ST segment elevation in 2, 3, aVF, and V6.  LVH was also noted.  Troponin returned elevated at 0.22.  Patient was advised that he needed admission but he wished to be discharged.  He did agree to a CT a of the chest, abdomen, and pelvis, which was negative for dissection but there was notation of subtle hypoattenuation involving the left ventricular free wall concerning for ischemia.  By the time the study resulted, the patient had left AMA.  He was contacted at home and a message was left advising that he return to the emergency department.  Patient says he did not return because he and his wife did not have childcare.  He continued to have intermittent nausea and vomiting on April 1 and 2 and then had return of chest pain on the evening of April 2, which persisted all night.  He presented back to the emergency department this morning and his troponin this morning is markedly elevated at 12.21.  He currently denies chest pain.  Upon further questioning, he reports that he has been experience and dyspnea on exertion, dating back to last summer when mowing the lawn.  He was not having exertional chest pain at that time.  Inpatient Medications      Family History    Family History  Problem Relation Age of Onset  . High blood pressure Mother        alive in her mid-50's  . Heart failure Mother   . Sleep apnea Father        alive in his mid-50's  . Heart attack Maternal Grandmother   . Diabetes Maternal Grandmother    indicated that his mother is alive. He indicated that his father is alive. He indicated that his maternal grandmother is  deceased.   Social History    Social History   Socioeconomic History  . Marital status: Married    Spouse name: Not on file  . Number of children: Not on file  . Years of education: Not on file  . Highest education level: Not on file  Occupational History  . Occupation: Unemployed  Social Needs  . Financial resource strain: Not on file  . Food insecurity:    Worry: Not on file    Inability: Not on file  . Transportation needs:    Medical: Not on file    Non-medical: Not on file  Tobacco Use  . Smoking status: Current Every Day Smoker    Packs/day: 1.00    Years: 15.00    Pack years: 15.00    Types: Cigarettes  . Smokeless tobacco: Never Used  Substance and Sexual Activity  . Alcohol use: Yes    Comment: 50-60oz/beer per day (~ 5 bottles)  . Drug use: Yes    Types: Marijuana    Comment: Uses multiple mj cigarettes daily.  Has been vaping THC for the past week.  Marland Kitchen Sexual activity: Yes  Lifestyle  . Physical activity:    Days per week: Not on file    Minutes per session: Not on file  . Stress: Not on file  Relationships  . Social connections:    Talks on phone: Not on file    Gets together: Not on file    Attends religious service: Not on file    Active member of club or organization: Not on file    Attends meetings of clubs or organizations: Not on file    Relationship status: Not on file  . Intimate partner violence:    Fear of current or ex partner: Not on file    Emotionally abused: Not on file    Physically abused: Not on file    Forced sexual activity: Not on file  Other Topics Concern  . Not on file  Social History Narrative   Lives in [place] with his wife and their children.  Does not routinely exercise but says he's 'always doing something.'  Enjoys cooking.     Review of Systems    General:  No chills, fever, night sweats or weight changes.  Cardiovascular:  +++ chest pain, +++ dyspnea on exertion, no edema, orthopnea, palpitations, paroxysmal  nocturnal dyspnea. Dermatological: No rash, lesions/masses Respiratory: No cough, +++ dyspnea Urologic: No hematuria, dysuria Abdominal:   +++ nausea, vomiting, abd pain, no diarrhea, bright red blood per rectum, melena, or hematemesis Neurologic:  No visual changes, wkns, changes in mental status. All other systems reviewed and are otherwise negative except as noted above.  Physical Exam    Blood pressure (!) 85/60, pulse 80, temperature 98.2 F (36.8 C), temperature source Oral, resp. rate 20, height 5\' 7"  (1.702 m), weight 150 lb (68 kg),  SpO2 97 %.  General:  NAD. Groggy.  Intermittently dozing off. Psych: flat affect. Neuro: Alert and oriented X 3. Moves all extremities spontaneously. HEENT: Normal  Neck: Supple without bruits or JVD. Lungs:  Resp regular and unlabored, CTA. Heart: RRR no s3, s4, or murmurs. Abdomen: Soft, diffusely tender, non-distended, BS + x 4.  Extremities: No clubbing, cyanosis or edema. DP/PT/Radials 2+ and equal bilaterally.  Labs     Recent Labs    05/13/17 1224 05/16/17 0840  TROPONINI 0.22* 12.21*   Lab Results  Component Value Date   WBC 21.0 (H) 05/16/2017   HGB 14.8 05/16/2017   HCT 46.7 05/16/2017   MCV 90.1 05/16/2017   PLT 244 05/16/2017    Recent Labs  Lab 05/16/17 0840  NA 136  K 4.4  CL 100*  CO2 22  BUN 12  CREATININE 1.31*  CALCIUM 9.1  PROT 7.9  BILITOT 2.0*  ALKPHOS 51  ALT 33  AST 72*  GLUCOSE 110*     Radiology Studies    Dg Chest 2 View  Result Date: 05/13/2017 CLINICAL DATA:  Acute chest pain for 1 day. EXAM: CHEST - 2 VIEW COMPARISON:  05/10/2016 and prior chest radiographs FINDINGS: The cardiomediastinal silhouette is unremarkable. There is no evidence of focal airspace disease, pulmonary edema, suspicious pulmonary nodule/mass, pleural effusion, or pneumothorax. No acute bony abnormalities are identified. IMPRESSION: No active cardiopulmonary disease. Electronically Signed   By: Harmon Pier M.D.   On:  05/13/2017 13:13   Dg Chest Port 1 View  Result Date: 05/16/2017 CLINICAL DATA:  Chest pain EXAM: PORTABLE CHEST 1 VIEW COMPARISON:  05/13/2017 FINDINGS: The heart size and mediastinal contours are within normal limits. Both lungs are clear. The visualized skeletal structures are unremarkable. IMPRESSION: No active disease. Electronically Signed   By: Elige Ko   On: 05/16/2017 09:05   Ct Angio Chest/abd/pel For Dissection W And/or W/wo  Result Date: 05/13/2017 CLINICAL DATA:  Chest pain starting last night. Nausea and vomiting. Elevated troponins. EXAM: CT ANGIOGRAPHY CHEST, ABDOMEN AND PELVIS TECHNIQUE: Multidetector CT imaging through the chest, abdomen and pelvis was performed using the standard protocol during bolus administration of intravenous contrast. Multiplanar reconstructed images and MIPs were obtained and reviewed to evaluate the vascular anatomy. CONTRAST:  75mL OMNIPAQUE IOHEXOL 350 MG/ML SOLN COMPARISON:  Plain films of earlier today chest. Abdominopelvic CT 05/10/2016. FINDINGS: CTA CHEST FINDINGS Cardiovascular: No intramural hematoma on noncontrast imaging. Bovine arch. Normal aortic caliber, without evidence of dissection. No central pulmonary embolism, on this non-dedicated study. Normal heart size, without pericardial effusion. Subtle hypoattenuation or hypoenhancement involving the left ventricular free wall, including on image 64/5. Mediastinum/Nodes: No mediastinal or hilar adenopathy. Tiny hiatal hernia. Lungs/Pleura: No pleural fluid.  Clear lungs. Musculoskeletal: No acute osseous abnormality. Review of the MIP images confirms the above findings. CTA ABDOMEN AND PELVIS FINDINGS VASCULAR Aorta: Normal in caliber, without dissection. Aortic and branch vessel atherosclerosis. Celiac: Widely patent. SMA: No stenosis. Renals: Single renal arteries bilaterally.  No significant stenosis. IMA: Patent proximally. Inflow: Within normal limits. Veins: Not well evaluated secondary to bolus  timing Review of the MIP images confirms the above findings. NON-VASCULAR Hepatobiliary: Normal liver. Normal gallbladder, without biliary ductal dilatation. Pancreas: Normal, without mass or ductal dilatation. Spleen: Normal in size, without focal abnormality. Adrenals/Urinary Tract: Normal adrenal glands. Normal kidneys, without hydronephrosis. Normal urinary bladder. Stomach/Bowel: Normal stomach, without wall thickening. Normal colon, appendix, and terminal ileum. Mild motion and EKG wire/lead artifact, primarily involving the  abdomen and upper pelvis. Normal small bowel. Lymphatic: No abdominopelvic adenopathy. Reproductive: Normal prostate. Other: No significant free fluid.  No free intraperitoneal air. Musculoskeletal: No acute osseous abnormality. Review of the MIP images confirms the above findings. IMPRESSION: 1. No evidence of aortic aneurysm or dissection. Aortic Atherosclerosis (ICD10-I70.0). This is significantly age advanced. 2. Subtle hypoattenuation or hypoenhancement involving the left ventricular free wall. Appearance is nonspecific. Given the clinical history of elevated troponins, acute cardiac ischemia cannot be excluded. 3. No other explanation for chest pain. 4. Mild artifact involving the abdomen. Electronically Signed   By: Jeronimo GreavesKyle  Talbot M.D.   On: 05/13/2017 14:45    ECG & Cardiac Imaging    RSR, 80, inflat TWI (new,.  Inflat ST elev noted on 3/31 no longer present  Assessment & Plan    1.  Late presenting myocardial infarction: Patient without prior cardiac history but he does have a family history of premature heart disease with his mother is suffering from heart failure in her mid 7150s.  It is unclear if she has CAD or not.  Patient began noticing dyspnea on exertion dating back to last summer and presented to the emergency department on March 31 with chest and abdominal pain associate with nausea and vomiting.  He had mild troponin elevation of 0.22 and ECG showed ST segment  elevation in leads II, III, aVF, and V6.  A CT of the chest abdomen and pelvis was performed to rule out dissection and this was negative for dissection but there was notation of hypoattenuation of the left ventricular free wall.  Patient left AMA and was called by the ER with recommendation to return.  A message was left.  Unfortunately, patient could not arrange childcare and only re-presented to the ER this morning after developing recurrent chest pain, nausea, and vomiting last night.  He is now chest pain-free however, his troponin is elevated at 12.21.  ECG no longer shows ST segment elevation but instead shows inferolateral T wave inversion.  It appears most likely that he suffered a myocardial infarction on the 31st and has more than likely completed his infarct.  Continue heparin, aspirin, and statin.  Blood pressure is too soft beta-blocker or ARB/ACE inhibitor therapy.  Continue to cycle cardiac markers he will require diagnostic catheterization and I will keep him n.p.o. after midnight.  We do have concerns about PCI given history of noncompliance.  We will start with echo today.  2.  Polysubstance abuse: Patient advised that he needs to quit smoking, drinking, and using marijuana.  He will need continued counseling and would likely benefit from outpatient social work follow-up and/or referrals.  3.  Nausea and vomiting: Question  possibility of anginal equivalent over the past few days.  Antiemetics per internal medicine.  Currently stable.  Signed, Nicolasa Duckinghristopher Dshawn Mcnay, NP 05/16/2017, 12:14 PM  For questions or updates, please contact   Please consult [email or web address] for contact info under Cardiology/STEMI.

## 2017-05-16 NOTE — ED Notes (Signed)
Blue top tube for PT/INR & PTT drawn 1101 prior to starting heparin drip.

## 2017-05-16 NOTE — Progress Notes (Signed)
Patient demanding to take heart monitor off because he wants to take a shower. Explained to patient he can not do that without a doctors order. Educated patient on what the heart monitor is for and why he needs to keep in on. Patient got agitated and upset, did not want to listen anymore. Will page MD.

## 2017-05-16 NOTE — Progress Notes (Signed)
Patient agrees to be compliant and stay on the unit. Educated patient on medications and plan of care. Will continue to monitor patient. MD notified.

## 2017-05-16 NOTE — Progress Notes (Signed)
Patient ambulating in the room with no issues. When rounding on patient, he had the IV out in his hand. Patient very frustrated stating he did not want to be "stuck in one spot". Patient disconnected himself from the IV fluids, stepped out of the room and stated he was going for a walk. Left to walk out to the nurses station. Patient does have heparin gtt orders for continuous infusion. Educated the patient on what the medication is for and its importance. Patient refusing to listen, walked out with child, family in the room. MD paged to notify.

## 2017-05-16 NOTE — H&P (Signed)
[facility] [place] [facility]   PATIENT NAME: Dean Boyd    MR#:  [id number]  DATE OF BIRTH:  1980-11-17  DATE OF ADMISSION:  05/16/2017  PRIMARY CARE PHYSICIAN: Patient, No Pcp Per   REQUESTING/REFERRING PHYSICIAN: Dr. Roxan Boyd  CHIEF COMPLAINT:  chest pain and vomiting  HISTORY OF PRESENT ILLNESS:  Dean Boyd  is a 37 y.o. male with a known history of polysubstance drug abuse, history of cyclical vomiting comes to the emergency room with chest pain and intractable vomiting. Patient was here a couple days ago went AMA. At that time his troponin was .22 to the emergency room today troponin was 12.1 he was started on IV heparin trip and admitted with acute NIST EMI   PAST MEDICAL HISTORY:   Past Medical History:  Diagnosis Date  . Alcohol abuse   . Marijuana abuse   . Nausea and vomiting   . Tobacco abuse     PAST SURGICAL HISTOIRY:  History reviewed. No pertinent surgical history.  SOCIAL HISTORY:   Social History   Tobacco Use  . Smoking status: Boyd Every Day Smoker    Packs/day: 1.00    Years: 15.00    Pack years: 15.00    Types: Cigarettes  . Smokeless tobacco: Never Used  Substance Use Topics  . Alcohol use: Yes    Comment: 50-60oz/beer per day (~ 5 bottles)    FAMILY HISTORY:   Family History  Problem Relation Age of Onset  . High blood pressure Mother        alive in her mid-50's  . Heart failure Mother   . Sleep apnea Father        alive in his mid-50's  . Heart attack Maternal Grandmother   . Diabetes Maternal Grandmother     DRUG ALLERGIES:  No Known Allergies  REVIEW OF SYSTEMS:  Review of Systems  Constitutional: Negative for chills, fever and weight loss.  HENT: Negative for ear discharge, ear pain and nosebleeds.   Eyes: Negative for blurred vision, pain and discharge.  Respiratory: Negative for sputum production, shortness of breath, wheezing and stridor.   Cardiovascular: Positive for  chest pain. Negative for palpitations, orthopnea and PND.  Gastrointestinal: Positive for vomiting. Negative for abdominal pain, diarrhea and nausea.  Genitourinary: Negative for frequency and urgency.  Musculoskeletal: Negative for back pain and joint pain.  Neurological: Negative for sensory change, speech change, focal weakness and weakness.  Psychiatric/Behavioral: Negative for depression and hallucinations. The patient is not nervous/anxious.      MEDICATIONS AT HOME:   Prior to Admission medications   Medication Sig Start Date End Date Taking? Authorizing Provider  potassium chloride SA (KLOR-CON M20) 20 MEQ tablet Take 1 tablet (20 mEq total) by mouth daily. 05/04/17  Yes Dean Rose, MD  ciprofloxacin (CIPRO) 500 MG tablet Take 1 tablet (500 mg total) by mouth 2 (two) times daily. X 7 more days Patient not taking: Reported on 05/16/2017 05/14/16   Dean Baas, MD  dicyclomine (BENTYL) 20 MG tablet Take 1 tablet (20 mg total) by mouth 3 (three) times daily as needed for spasms. Patient not taking: Reported on 05/12/2016 05/11/16 05/11/17  Dean Current, MD  ondansetron [facility]) 4 MG tablet Take 1-2 tabs by mouth every 8 hours as needed for nausea/vomiting Patient not taking: Reported on 05/16/2017 05/04/17   Dean Rose, MD      VITAL SIGNS:  Blood pressure 108/74, pulse 78, temperature 97.8 F (36.6 C), temperature source  Oral, resp. rate 20, height 5\' 7"  (1.702 m), weight 69.9 kg (154 lb 1.6 oz), SpO2 100 %.  PHYSICAL EXAMINATION:  GENERAL:  37 y.o.-year-old patient lying in the bed with no acute distress.  EYES: Pupils equal, round, reactive to light and accommodation. No scleral icterus. Extraocular muscles intact.  HEENT: Head atraumatic, normocephalic. Oropharynx and nasopharynx clear.  NECK:  Supple, no jugular venous distention. No thyroid enlargement, no tenderness.  LUNGS: Normal breath sounds bilaterally, no wheezing, rales,rhonchi or crepitation. No use of  accessory muscles of respiration.  CARDIOVASCULAR: S1, S2 normal. No murmurs, rubs, or gallops.  ABDOMEN: Soft, nontender, nondistended. Bowel sounds present. No organomegaly or mass.  EXTREMITIES: No pedal edema, cyanosis, or clubbing.  NEUROLOGIC: Cranial nerves II through XII are intact. Muscle strength 5/5 in all extremities. Sensation intact. Gait not checked.  PSYCHIATRIC: The patient is alert and oriented x 3.  SKIN: No obvious rash, lesion, or ulcer.   LABORATORY PANEL:   CBC Recent Labs  Lab 05/16/17 0840  WBC 21.0*  HGB 14.8  HCT 46.7  PLT 244   ------------------------------------------------------------------------------------------------------------------  Chemistries  Recent Labs  Lab 05/16/17 0840 05/16/17 1257  NA 136  --   K 4.4  --   CL 100*  --   CO2 22  --   GLUCOSE 110*  --   BUN 12  --   CREATININE 1.31*  --   CALCIUM 9.1  --   MG  --  1.7  AST 72*  --   ALT 33  --   ALKPHOS 51  --   BILITOT 2.0*  --    ------------------------------------------------------------------------------------------------------------------  Cardiac Enzymes Recent Labs  Lab 05/16/17 1257  TROPONINI 13.77*   ------------------------------------------------------------------------------------------------------------------  RADIOLOGY:  Dg Chest Port 1 View  Result Date: 05/16/2017 CLINICAL DATA:  Chest pain EXAM: PORTABLE CHEST 1 VIEW COMPARISON:  05/13/2017 FINDINGS: The heart size and mediastinal contours are within normal limits. Both lungs are clear. The visualized skeletal structures are unremarkable. IMPRESSION: No active disease. Electronically Signed   By: Dean KoHetal  Chondra Boyd   On: 05/16/2017 09:05    EKG:    IMPRESSION AND PLAN:   Dean Boyd  is a 37 y.o. male with a known history of polysubstance drug abuse, history of cyclical vomiting comes to the emergency room with chest pain and intractable vomiting.  1. Acute non-ST EMI -admit to telemetry  floor -cardiology consultation placed. Recommends cardiac catheterization. -Continue aspirin, Lipitor, IV heparin drip  2. Ongoing marijuana use -advised abstinence  3. Intractable nausea vomiting IV fluids PRN Zofran  4. DVT prophylaxis IV heparin drip  Above discussed with family All the records are reviewed and case discussed with ED provider. Management plans discussed with the patient, family and they are in agreement.  CODE STATUS: full  TOTAL TIME TAKING CARE OF THIS PATIENT: 50 minutes.    Enedina FinnerSona Rivers Hamrick M.D on 05/16/2017 at 4:09 PM  Between 7am to 6pm - Pager - [phone]  After 6pm go to [email or web address] - password [email or web address] [facility]  [facility]  Office  [phone]  CC: Primary care physician; Patient, No Pcp Per

## 2017-05-16 NOTE — ED Notes (Signed)
Pt informed of needing urine sample. Pt states he is unable to provide sample at this time

## 2017-05-16 NOTE — Plan of Care (Signed)

## 2017-05-16 NOTE — ED Triage Notes (Signed)
Seen through ED twice over past two weeks.  Previous complaint was N/V/D and patient left AMA on Sunday - refusing admission.  ARrives today with C/O chest pain since 2300.  Also c/o sob, nausea and vomiting.

## 2017-05-16 NOTE — Progress Notes (Signed)
ANTICOAGULATION CONSULT NOTE - Initial Consult  Pharmacy Consult for Heparin Drip Indication: chest pain/ACS and NSTEMI  No Known Allergies  Patient Measurements: Height: 5\' 7"  (170.2 cm) Weight: 154 lb 1.6 oz (69.9 kg) IBW/kg (Calculated) : 66.1 Heparin Dosing Weight: 68kg  Vital Signs: Temp: 99.1 F (37.3 C) (04/03 1838) Temp Source: Oral (04/03 1838) BP: 124/67 (04/03 1838) Pulse Rate: 75 (04/03 1838)  Labs: Recent Labs    05/16/17 0840 05/16/17 1100 05/16/17 1144 05/16/17 1257 05/16/17 1734  HGB 14.8  --   --   --   --   HCT 46.7  --   --   --   --   PLT 244  --   --   --   --   APTT  --  29  --   --   --   LABPROT  --  13.2  --   --   --   INR  --  1.01  --   --   --   HEPARINUNFRC  --   --   --   --  <0.10*  CREATININE 1.31*  --   --   --   --   TROPONINI 12.21*  --  13.52* 13.77* 9.37*    Estimated Creatinine Clearance: 72.9 mL/min (A) (by C-G formula based on SCr of 1.31 mg/dL (H)).   Medical History: Past Medical History:  Diagnosis Date  . Alcohol abuse   . Marijuana abuse   . Nausea and vomiting   . Tobacco abuse      Assessment: Patient is a 37yo male admitted for chest pain. The patient either has myocarditis vs. NSTEMI. Pharmacy consulted for Heparin dosing. Reviewed prior meds and no history of prior anticoagulant.  Goal of Therapy:  Heparin level 0.3-0.7 units/ml Monitor platelets by anticoagulation protocol: Yes   Plan:  4/3 @ 1734 HL subtherapeutic at <0.10. Will order 2100 unit heparin bolus and increase heparin infusion to 1000u/hr. Recheck HL in 6 hours.   Gardner CandleSheema M Luzmaria Devaux, PharmD, BCPS Clinical Pharmacist 05/16/2017 6:43 PM

## 2017-05-16 NOTE — Progress Notes (Signed)
Patient back to the unit, will re attempt to educate patient on the importance of medications.

## 2017-05-16 NOTE — ED Provider Notes (Signed)
[facility] Emergency Department Provider Note  ____________________________________________   First MD Initiated Contact with Patient 05/16/17 [phone]     (approximate)  I have reviewed the triage vital signs and the nursing notes.   HISTORY  Chief Complaint Chest Pain   HPI Dean Boyd is a 37 y.o. male who self presents to the emergency department with nausea vomiting and epigastric pain for the past 2 weeks or so.  He was seen initially in our emergency department on the 21st and treated for cannabis hyperemesis.  He subsequently returned on the 31st 3 days ago and was once again treated for cannabis hyperemesis however at that time he had a positive troponin to 0.22.  Dr. York Cerise attempted to admit the patient to the hospital, however he signed out AGAINST MEDICAL ADVICE.  He returns today with persistent discomfort.  His pain is not particularly in his chest this time however epigastric and burning.  The only thing that seems to make his symptoms better is taking hot showers.  His symptoms are severe and radiating from his epigastrium to his left shoulder.  History reviewed. No pertinent past medical history.  Patient Active Problem List   Diagnosis Date Noted  . Intractable vomiting with nausea 05/12/2016    History reviewed. No pertinent surgical history.  Prior to Admission medications   Medication Sig Start Date End Date Taking? Authorizing Provider  potassium chloride SA (KLOR-CON M20) 20 MEQ tablet Take 1 tablet (20 mEq total) by mouth daily. 05/04/17  Yes Loleta Rose, MD  ciprofloxacin (CIPRO) 500 MG tablet Take 1 tablet (500 mg total) by mouth 2 (two) times daily. X 7 more days Patient not taking: Reported on 05/16/2017 05/14/16   Enid Baas, MD  dicyclomine (BENTYL) 20 MG tablet Take 1 tablet (20 mg total) by mouth 3 (three) times daily as needed for spasms. Patient not taking: Reported on 05/12/2016 05/11/16 05/11/17  Darci Current,  MD  ondansetron [facility]) 4 MG tablet Take 1-2 tabs by mouth every 8 hours as needed for nausea/vomiting Patient not taking: Reported on 05/16/2017 05/04/17   Loleta Rose, MD    Allergies Patient has no known allergies.  No family history on file.  Social History Social History   Tobacco Use  . Smoking status: Current Every Day Smoker    Packs/day: 0.50    Types: Cigarettes  . Smokeless tobacco: Never Used  Substance Use Topics  . Alcohol use: No  . Drug use: No    Review of Systems Constitutional: No fever/chills Eyes: No visual changes. ENT: No sore throat. Cardiovascular: Denies chest pain. Respiratory: Denies shortness of breath. Gastrointestinal: Positive for abdominal pain.  Positive for nausea, positive for vomiting.  No diarrhea.  No constipation. Genitourinary: Negative for dysuria. Musculoskeletal: Negative for back pain. Skin: Negative for rash. Neurological: Negative for headaches, focal weakness or numbness.   ____________________________________________   PHYSICAL EXAM:  VITAL SIGNS: ED Triage Vitals  Enc Vitals Group     BP 05/16/17 0802 118/87     Pulse Rate 05/16/17 0802 80     Resp 05/16/17 0802 16     Temp 05/16/17 0802 98.2 F (36.8 C)     Temp Source 05/16/17 0802 Oral     SpO2 05/16/17 0802 95 %     Weight 05/16/17 0800 150 lb (68 kg)     Height 05/16/17 0800 5\' 7"  (1.702 m)     Head Circumference --      Peak Flow --  Pain Score 05/16/17 0800 9     Pain Loc --      Pain Edu? --      Excl. in GC? --     Constitutional: Curled on his side retching and vomiting and appears extremely uncomfortable Eyes: PERRL EOMI. Head: Atraumatic. Nose: No congestion/rhinnorhea. Mouth/Throat: No trismus Neck: No stridor.   Cardiovascular: Normal rate, regular rhythm. Grossly normal heart sounds.  Good peripheral circulation. Respiratory: Increased respiratory effort.  No retractions. Lungs CTAB and moving good air Gastrointestinal: Soft  abdomen nontender no rebound or guarding no peritonitis Musculoskeletal: No lower extremity edema   Neurologic:  . No gross focal neurologic deficits are appreciated. Skin:  Skin is warm, dry and intact. No rash noted.    ____________________________________________   DIFFERENTIAL includes but not limited to  Cannabis hyperemesis syndrome, pancreatitis, acute coronary syndrome, myocarditis ____________________________________________   LABS (all labs ordered are listed, but only abnormal results are displayed)  Labs Reviewed  COMPREHENSIVE METABOLIC PANEL - Abnormal; Notable for the following components:      Result Value   Chloride 100 (*)    Glucose, Bld 110 (*)    Creatinine, Ser 1.31 (*)    AST 72 (*)    Total Bilirubin 2.0 (*)    All other components within normal limits  TROPONIN I - Abnormal; Notable for the following components:   Troponin I 12.21 (*)    All other components within normal limits  BRAIN NATRIURETIC PEPTIDE - Abnormal; Notable for the following components:   B Natriuretic Peptide 228.0 (*)    All other components within normal limits  CBC WITH DIFFERENTIAL/PLATELET - Abnormal; Notable for the following components:   WBC 21.0 (*)    MCHC 31.6 (*)    Neutro Abs 17.4 (*)    Monocytes Absolute 1.5 (*)    All other components within normal limits  LIPASE, BLOOD  ETHANOL  URINALYSIS, COMPLETE (UACMP) WITH MICROSCOPIC  URINE DRUG SCREEN, QUALITATIVE ([facility] ONLY)    Lab work reviewed by me with significantly elevated troponin concerning for primary myocardial ischemia __________________________________________  EKG  ED ECG REPORT I, Merrily BrittleNeil Avion Patella, the attending physician, personally viewed and interpreted this ECG.  Date: 05/16/2017 EKG Time: 0758 Rate: 76 Rhythm: normal sinus rhythm QRS Axis: normal Intervals: normal ST/T Wave abnormalities: Inferior lateral T wave inversion which are different compared to EKG performed 05/13/17 Narrative  Interpretation: Inferior and lateral T wave inversion possibly consistent with acute ischemia  ED ECG REPORT I, Merrily BrittleNeil Terresa Marlett, the attending physician, personally viewed and interpreted this ECG.  Date: 05/16/2017 EKG Time: 0950 Rate: 85 Rhythm: normal sinus rhythm QRS Axis: normal Intervals: normal ST/T Wave abnormalities: T wave inversion 2 3 aVF V4 V5 V6 roughly the same as earlier today Narrative Interpretation: Concerning for possible subendocardial ischemia   ____________________________________________  RADIOLOGY  Chest x-ray reviewed by me with no acute disease ____________________________________________   PROCEDURES  Procedure(s) performed: no  .Critical Care Performed by: Merrily Brittleifenbark, Shaney Deckman, MD Authorized by: Merrily Brittleifenbark, Olando Willems, MD   Critical care provider statement:    Critical care time (minutes):  40   Critical care time was exclusive of:  Separately billable procedures and treating other patients   Critical care was necessary to treat or prevent imminent or life-threatening deterioration of the following conditions:  Cardiac failure   Critical care was time spent personally by me on the following activities:  Development of treatment plan with patient or surrogate, discussions with consultants, evaluation of patient's response to treatment,  examination of patient, obtaining history from patient or surrogate, ordering and performing treatments and interventions, ordering and review of laboratory studies, ordering and review of radiographic studies, pulse oximetry, re-evaluation of patient's condition and review of old charts    Critical Care performed: Yes  Observation: no ____________________________________________   INITIAL IMPRESSION / ASSESSMENT AND PLAN / ED COURSE  Pertinent labs & imaging results that were available during my care of the patient were reviewed by me and considered in my medical decision making (see chart for details).  The patient  arrives extremely uncomfortable appearing with nausea vomiting and retching in the setting of aggressive cannabis abuse that only gets improved with hot showers.  His constellation of symptoms are most consistent with cannabis hyperemesis syndrome so I will give him 5 mg of Haldol now.  I am concerned about acute coronary syndrome somewhat however given new EKG changes and his previously elevated troponin so 162 mg of aspirin now while labs including a troponin are pending.     ----------------------------------------- 10:02 AM on 05/16/2017 -----------------------------------------  I discussed the case with on-call cardiologist Dr. and who will kindly come evaluate the patient.  He is asked me to order a stat echocardiogram.  I then discussed with the patient and family at bedside who verbalized understanding and agreement with admission to the hospital.  I then discussed with the hospitalist who has graciously agreed to admit the patient to her service.  The patient either has likely myocarditis versus a non-STEMI with acute coronary syndrome 7 the meantime I am placing him on a heparin drip in addition to the aspirin already received.  He is hemodynamically stable at this point. ____________________________________________   FINAL CLINICAL IMPRESSION(S) / ED DIAGNOSES  Final diagnoses:  Non-ST elevation myocardial infarction (NSTEMI) (HCC)      NEW MEDICATIONS STARTED DURING THIS VISIT:  New Prescriptions   No medications on file     Note:  This document was prepared using Dragon voice recognition software and may include unintentional dictation errors.     Merrily Brittle, MD 05/16/17 1003

## 2017-05-16 NOTE — ED Notes (Signed)
Attempted to call report, RN to call back.

## 2017-05-16 NOTE — ED Notes (Signed)
Family updated on room assignment   

## 2017-05-16 NOTE — Progress Notes (Signed)
*  PRELIMINARY RESULTS* Echocardiogram 2D Echocardiogram has been performed.  Cristela BlueHege, Jacquel Redditt 05/16/2017, 2:19 PM

## 2017-05-17 ENCOUNTER — Encounter
Admission: EM | Disposition: A | Discharge status: Home / Self Care | Payer: Self-pay | Source: Home / Self Care | Attending: Internal Medicine

## 2017-05-17 DIAGNOSIS — E876 Hypokalemia: Secondary | ICD-10-CM

## 2017-05-17 DIAGNOSIS — I214 Non-ST elevation (NSTEMI) myocardial infarction: Secondary | ICD-10-CM

## 2017-05-17 HISTORY — PX: LEFT HEART CATH AND CORONARY ANGIOGRAPHY: CATH118249

## 2017-05-17 LAB — LIPID PANEL
CHOL/HDL RATIO: 2.8 ratio
Cholesterol: 138 mg/dL (ref 0–200)
HDL: 49 mg/dL (ref >40)
LDL Cholesterol: 59 mg/dL (ref 0–99)
Triglycerides: 148 mg/dL (ref <150)
VLDL: 30 mg/dL (ref 0–40)

## 2017-05-17 LAB — CBC
HCT: 38.7 % — ABNORMAL LOW (ref 40.0–52.0)
HEMOGLOBIN: 12.2 g/dL — AB (ref 13.0–18.0)
MCH: 28.6 pg (ref 26.0–34.0)
MCHC: 31.5 g/dL — AB (ref 32.0–36.0)
MCV: 90.5 fL (ref 80.0–100.0)
PLATELETS: 223 10*3/uL (ref 150–440)
RBC: 4.28 MIL/uL — ABNORMAL LOW (ref 4.40–5.90)
RDW: 13.6 % (ref 11.5–14.5)
WBC: 13.1 10*3/uL — ABNORMAL HIGH (ref 3.8–10.6)

## 2017-05-17 LAB — BASIC METABOLIC PANEL
Anion gap: 8 (ref 5–15)
BUN: 11 mg/dL (ref 6–20)
CHLORIDE: 106 mmol/L (ref 101–111)
CO2: 25 mmol/L (ref 22–32)
CREATININE: 1.12 mg/dL (ref 0.61–1.24)
Calcium: 8.3 mg/dL — ABNORMAL LOW (ref 8.9–10.3)
Glucose, Bld: 92 mg/dL (ref 65–99)
Potassium: 3.2 mmol/L — ABNORMAL LOW (ref 3.5–5.1)
SODIUM: 139 mmol/L (ref 135–145)

## 2017-05-17 LAB — HEPARIN LEVEL (UNFRACTIONATED)
HEPARIN UNFRACTIONATED: 0.13 [IU]/mL — AB (ref 0.30–0.70)
Heparin Unfractionated: 0.49 IU/mL (ref 0.30–0.70)

## 2017-05-17 LAB — TROPONIN I: TROPONIN I: 10.33 ng/mL — AB (ref <0.03)

## 2017-05-17 SURGERY — LEFT HEART CATH AND CORONARY ANGIOGRAPHY
Anesthesia: Moderate Sedation

## 2017-05-17 MED ORDER — SODIUM CHLORIDE 0.9 % IV SOLN
INTRAVENOUS | Status: AC | PRN
  Administered 2017-05-17: 250 mL via INTRAVENOUS

## 2017-05-17 MED ORDER — MIDAZOLAM HCL 2 MG/2ML IJ SOLN
INTRAMUSCULAR | Status: DC | PRN
  Administered 2017-05-17: 1 mg via INTRAVENOUS

## 2017-05-17 MED ORDER — SODIUM CHLORIDE 0.9 % IV SOLN: 250.0000 mL | INTRAVENOUS | Status: DC | PRN

## 2017-05-17 MED ORDER — NITROGLYCERIN 1 MG/10 ML FOR IR/CATH LAB
INTRA_ARTERIAL | Status: DC | PRN
  Administered 2017-05-17: 100 ug via INTRACORONARY

## 2017-05-17 MED ORDER — HEPARIN SODIUM (PORCINE) 1000 UNIT/ML IJ SOLN
INTRAMUSCULAR | Status: DC | PRN
  Administered 2017-05-17: 3500 [IU] via INTRAVENOUS

## 2017-05-17 MED ORDER — SODIUM CHLORIDE 0.9 % WEIGHT BASED INFUSION: 1.0000 mL/kg/h | INTRAVENOUS | Status: DC

## 2017-05-17 MED ORDER — SODIUM CHLORIDE 0.9 % IV SOLN: INTRAVENOUS | Status: AC

## 2017-05-17 MED ORDER — SODIUM CHLORIDE 0.9% FLUSH: 3.0000 mL | Freq: Two times a day (BID) | INTRAVENOUS | Status: DC

## 2017-05-17 MED ORDER — SODIUM CHLORIDE 0.9% FLUSH: 3.0000 mL | INTRAVENOUS | Status: DC | PRN

## 2017-05-17 MED ORDER — HEPARIN SODIUM (PORCINE) 1000 UNIT/ML IJ SOLN
INTRAMUSCULAR | Status: AC
  Filled 2017-05-17: qty 1

## 2017-05-17 MED ORDER — CLOPIDOGREL BISULFATE 75 MG PO TABS
300.0000 mg | ORAL_TABLET | Freq: Once | ORAL | Status: AC
  Administered 2017-05-17: 300 mg via ORAL
  Filled 2017-05-17: qty 4

## 2017-05-17 MED ORDER — NITROGLYCERIN 5 MG/ML IV SOLN
INTRAVENOUS | Status: AC
  Filled 2017-05-17: qty 10

## 2017-05-17 MED ORDER — SODIUM CHLORIDE 0.9% FLUSH
3.0000 mL | Freq: Two times a day (BID) | INTRAVENOUS | Status: DC
  Administered 2017-05-17: 3 mL via INTRAVENOUS

## 2017-05-17 MED ORDER — ASPIRIN 81 MG PO CHEW: 81.0000 mg | CHEWABLE_TABLET | ORAL | Status: DC

## 2017-05-17 MED ORDER — CLOPIDOGREL BISULFATE 75 MG PO TABS
75.0000 mg | ORAL_TABLET | Freq: Every day | ORAL | Status: DC
  Filled 2017-05-17: qty 1

## 2017-05-17 MED ORDER — IOPAMIDOL (ISOVUE-300) INJECTION 61%
INTRAVENOUS | Status: DC | PRN
  Administered 2017-05-17: 55 mL via INTRA_ARTERIAL

## 2017-05-17 MED ORDER — POTASSIUM CHLORIDE CRYS ER 20 MEQ PO TBCR
40.0000 meq | EXTENDED_RELEASE_TABLET | Freq: Once | ORAL | Status: AC
  Administered 2017-05-17: 40 meq via ORAL
  Filled 2017-05-17: qty 2

## 2017-05-17 MED ORDER — HEPARIN (PORCINE) IN NACL 2-0.9 UNIT/ML-% IJ SOLN
INTRAMUSCULAR | Status: AC
  Filled 2017-05-17: qty 1000

## 2017-05-17 MED ORDER — VERAPAMIL HCL 2.5 MG/ML IV SOLN
INTRAVENOUS | Status: DC | PRN
  Administered 2017-05-17: 2.5 mg via INTRA_ARTERIAL

## 2017-05-17 MED ORDER — HEPARIN BOLUS VIA INFUSION
2000.0000 [IU] | Freq: Once | INTRAVENOUS | Status: AC
  Administered 2017-05-17: 2000 [IU] via INTRAVENOUS
  Filled 2017-05-17: qty 2000

## 2017-05-17 MED ORDER — FENTANYL CITRATE (PF) 100 MCG/2ML IJ SOLN
INTRAMUSCULAR | Status: DC | PRN
  Administered 2017-05-17: 50 ug via INTRAVENOUS

## 2017-05-17 MED ORDER — ATORVASTATIN CALCIUM 20 MG PO TABS
40.0000 mg | ORAL_TABLET | Freq: Every day | ORAL | Status: DC
  Administered 2017-05-17: 40 mg via ORAL
  Filled 2017-05-17: qty 2

## 2017-05-17 MED ORDER — MIDAZOLAM HCL 2 MG/2ML IJ SOLN
INTRAMUSCULAR | Status: AC
  Filled 2017-05-17: qty 2

## 2017-05-17 MED ORDER — VERAPAMIL HCL 2.5 MG/ML IV SOLN
INTRAVENOUS | Status: AC
  Filled 2017-05-17: qty 2

## 2017-05-17 MED ORDER — SODIUM CHLORIDE 0.9 % WEIGHT BASED INFUSION
3.0000 mL/kg/h | INTRAVENOUS | Status: AC
  Administered 2017-05-17: 3 mL/kg/h via INTRAVENOUS

## 2017-05-17 MED ORDER — FENTANYL CITRATE (PF) 100 MCG/2ML IJ SOLN
INTRAMUSCULAR | Status: AC
  Filled 2017-05-17: qty 2

## 2017-05-17 SURGICAL SUPPLY — 6 items
CATH OPTITORQUE JACKY 4.0 5F (CATHETERS) ×3 IMPLANT
DEVICE RAD TR BAND REGULAR (VASCULAR PRODUCTS) ×3 IMPLANT
GLIDESHEATH SLEND SS 6F .021 (SHEATH) ×3 IMPLANT
KIT MANI 3VAL PERCEP (MISCELLANEOUS) ×3 IMPLANT
PACK CARDIAC CATH (CUSTOM PROCEDURE TRAY) ×3 IMPLANT
WIRE ROSEN-J .035X260CM (WIRE) ×3 IMPLANT

## 2017-05-17 NOTE — Plan of Care (Signed)
  Problem: Education: Goal: Knowledge of General Education information will improve Outcome: Progressing   Problem: Health Behavior/Discharge Planning: Goal: Ability to manage health-related needs will improve Outcome: Progressing   Problem: Clinical Measurements: Goal: Ability to maintain clinical measurements within normal limits will improve Outcome: Progressing Goal: Diagnostic test results will improve Outcome: Progressing Goal: Respiratory complications will improve Outcome: Progressing Goal: Cardiovascular complication will be avoided Outcome: Progressing   

## 2017-05-17 NOTE — Progress Notes (Signed)
Progress Note  Patient Name: Dean Boyd Date of Encounter: 05/17/2017  Primary Cardiologist: Yvonne Kendall, MD  Subjective   No chest pain or sob overnight.  Left floor for several hours - says he thought it was ok to walk around.    Inpatient Medications    Scheduled Meds: . aspirin EC  81 mg Oral Daily  . atorvastatin  80 mg Oral q1800   Continuous Infusions: . sodium chloride 75 mL/hr at 05/16/17 1325  . heparin 1,250 Units/hr (05/17/17 0443)   PRN Meds: acetaminophen **OR** acetaminophen, ondansetron **OR** ondansetron (ZOFRAN) IV, traMADol   Vital Signs    Vitals:   05/16/17 1200 05/16/17 1251 05/16/17 1838 05/16/17 1921  BP: (!) 85/60 108/74 124/67 111/70  Pulse: 80 78 75 88  Resp: 20 20 14    Temp:  97.8 F (36.6 C) 99.1 F (37.3 C) 98.6 F (37 C)  TempSrc:  Oral Oral Oral  SpO2: 97% 100% 100% 98%  Weight:  154 lb 1.6 oz (69.9 kg)    Height:        Intake/Output Summary (Last 24 hours) at 05/17/2017 0744 Last data filed at 05/17/2017 0549 Gross per 24 hour  Intake 1174.95 ml  Output 0 ml  Net 1174.95 ml   Filed Weights   05/16/17 0800 05/16/17 1251  Weight: 150 lb (68 kg) 154 lb 1.6 oz (69.9 kg)    Physical Exam   GEN: Well nourished, well developed, in no acute distress.  HEENT: Grossly normal.  Neck: Supple, no JVD, carotid bruits, or masses. Cardiac: RRR, no murmurs, rubs, or gallops. No clubbing, cyanosis, edema.  Radials/DP/PT 2+ and equal bilaterally.  Respiratory:  Respirations regular and unlabored, clear to auscultation bilaterally. GI: Soft, nontender, nondistended, BS + x 4. MS: no deformity or atrophy. Skin: warm and dry, no rash. Neuro:  Strength and sensation are intact. Psych: AAOx3.  Normal affect.  Labs    Chemistry Recent Labs  Lab 05/13/17 1224 05/16/17 0840 05/17/17 0010  NA 138 136 139  K 3.6 4.4 3.2*  CL 105 100* 106  CO2 21* 22 25  GLUCOSE 137* 110* 92  BUN 8 12 11   CREATININE 1.04 1.31* 1.12  CALCIUM  8.9 9.1 8.3*  PROT 7.1 7.9  --   ALBUMIN 3.7 3.8  --   AST 30 72*  --   ALT 19 33  --   ALKPHOS 47 51  --   BILITOT 0.8 2.0*  --   GFRNONAA >60 >60 >60  GFRAA >60 >60 >60  ANIONGAP 12 14 8      Hematology Recent Labs  Lab 05/13/17 1224 05/16/17 0840  WBC 19.2* 21.0*  RBC 5.06 5.19  HGB 14.6 14.8  HCT 45.2 46.7  MCV 89.3 90.1  MCH 28.8 28.5  MCHC 32.3 31.6*  RDW 13.9 13.7  PLT 232 244    Cardiac Enzymes Recent Labs  Lab 05/16/17 1144 05/16/17 1257 05/16/17 1734 05/17/17 0010  TROPONINI 13.52* 13.77* 9.37* 10.33*    BNP Recent Labs  Lab 05/16/17 0840  BNP 228.0*     Radiology    Dg Chest Port 1 View  Result Date: 05/16/2017 CLINICAL DATA:  Chest pain EXAM: PORTABLE CHEST 1 VIEW COMPARISON:  05/13/2017 FINDINGS: The heart size and mediastinal contours are within normal limits. Both lungs are clear. The visualized skeletal structures are unremarkable. IMPRESSION: No active disease. Electronically Signed   By: Elige Ko   On: 05/16/2017 09:05    Telemetry  rsr - Personally Reviewed  Cardiac Studies   Study Conclusions   - Left ventricle: The cavity size was normal. There was mild   asymmetric hypertrophy of the posterior wall. Systolic function   was normal. The estimated ejection fraction was in the range of   50% to 55%. There is severe hypokinesis of the   basal-midinferolateral myocardium. - Mitral valve: There was mild regurgitation. - Right ventricle: The cavity size was normal. Systolic function   was normal.  Patient Profile     37 y.o. male w/ a h/o polysubstance abuse admitted 4/3 with recurrent cp and elevated trop after presenting over the weekend with similar.  Assessment & Plan    1.  Late presenting MI:  Pt presented to ER over the weekend with chest pain, abd pain, n, v.  Noted to have trop of 0.22.  CTA neg for dissection but hypoattenuation of LV noted - suspicious for ischemia.  Pt left AMA.  Recurrent c/p on evening of 4/2  prompting re-presentation on 4/3.  Trop 12 and subsequently rose to 13.  No recurrent c/p.  Review of 3/31 ECGs showed ST elev in II, III, aVF, and V6.  Echocardiogram yesterday shows EF of to 50-55% with severe hypokinesis in the basal to mid inferolateral myocardium.  He is willing to undergo further cardiovascular evaluation and says he is committed to improving his lifestyle and would be compliant.  I have discussed his case with Dr. Kirke CorinArida and we will schedule him for cardiac catheterization this afternoon. The patient understands that risks include but are not limited to stroke (1 in 1000), death (1 in 1000), kidney failure [usually temporary] (1 in 500), bleeding (1 in 200), allergic reaction [possibly serious] (1 in 200), and agrees to proceed. I will give him a clear liquid breakfast and he will be n.p.o. after that.  Continue aspirin and statin therapy.  Though his blood pressure was soft yesterday, it is improved today.  If he continues to trend in this fashion, we can add low-dose beta-blocker but I will hold off for the time being.  2.  Polysubstance abuse: Advised to quit smoking, drinking, and using marijuana.  He will require ongoing counseling.  He has been advised that he may not use the floor to go outside and smoke.  3.  Nausea and vomiting: No recurrence.  Question possibility of anginal equivalent.  4.  Hypokalemia: Supplementation ordered.  Signed, Nicolasa Duckinghristopher Shayaan Parke, NP  05/17/2017, 7:44 AM    For questions or updates, please contact   Please consult [email or web address] for contact info under Cardiology/STEMI.

## 2017-05-17 NOTE — Progress Notes (Signed)
Initial Nutrition Assessment  DOCUMENTATION CODES:   Not applicable  INTERVENTION:   Recommend Ensure Enlive po BID, each supplement provides 350 kcal and 20 grams of protein  Recommend MVI daily  NUTRITION DIAGNOSIS:   Inadequate oral intake related to acute illness as evidenced by NPO status.  GOAL:   Patient will meet greater than or equal to 90% of their needs  MONITOR:   Diet advancement, Labs, Weight trends, I & O's  REASON FOR ASSESSMENT:   Malnutrition Screening Tool    ASSESSMENT:   37 y.o. male with a known history of polysubstance drug abuse, history of cyclical vomiting comes to the emergency room with chest pain and intractable vomiting.   Unable to see pt today. Pt in shower at time of RD visit. Pt on NPO/clear liquid diet since admit. Pt NPO today for procedure. Pt requesting food throughout the day. Per chart, pt is weight stable. Suspect good appetite and oral intake pta. Recommend supplements and MVI when diet advanced.   Medications reviewed and include: aspirin, heparin  Labs reviewed: K 3.2(L) Wbc- 13.1(H)  Unable to complete Nutrition-Focused physical exam at this time.   Diet Order:  Diet NPO time specified  EDUCATION NEEDS:   No education needs have been identified at this time  Skin: Reviewed RN Assessment  Last BM:  4/4  Height:   Ht Readings from Last 1 Encounters:  05/16/17 5\' 7"  (1.702 m)    Weight:   Wt Readings from Last 1 Encounters:  05/16/17 154 lb 1.6 oz (69.9 kg)    Ideal Body Weight:  61.4 kg  BMI:  Body mass index is 24.14 kg/m.  Estimated Nutritional Needs:   Kcal:  1800-2100kcal/day   Protein:  70-84g/day   Fluid:  >1.8L/day   Betsey Holidayasey Sonji Starkes MS, RD, LDN Pager #[phone] After Hours Pager: [phone]

## 2017-05-17 NOTE — Progress Notes (Signed)
ANTICOAGULATION CONSULT NOTE - Initial Consult  Pharmacy Consult for Heparin Drip Indication: chest pain/ACS and NSTEMI  No Known Allergies  Patient Measurements: Height: 5\' 7"  (170.2 cm) Weight: 154 lb 1.6 oz (69.9 kg) IBW/kg (Calculated) : 66.1 Heparin Dosing Weight: 68kg  Vital Signs: Temp: 97.5 F (36.4 C) (04/04 0849) BP: 112/72 (04/04 0849) Pulse Rate: 82 (04/04 0849)  Labs: Recent Labs    05/16/17 0840 05/16/17 1100  05/16/17 1257 05/16/17 1734 05/17/17 0010 05/17/17 1010  HGB 14.8  --   --   --   --   --  12.2*  HCT 46.7  --   --   --   --   --  38.7*  PLT 244  --   --   --   --   --  223  APTT  --  29  --   --   --   --   --   LABPROT  --  13.2  --   --   --   --   --   INR  --  1.01  --   --   --   --   --   HEPARINUNFRC  --   --   --   --  <0.10* 0.13* 0.49  CREATININE 1.31*  --   --   --   --  1.12  --   TROPONINI 12.21*  --    < > 13.77* 9.37* 10.33*  --    < > = values in this interval not displayed.    Estimated Creatinine Clearance: 85.2 mL/min (by C-G formula based on SCr of 1.12 mg/dL).   Medical History: Past Medical History:  Diagnosis Date  . Alcohol abuse   . Marijuana abuse   . Nausea and vomiting   . Tobacco abuse      Assessment: Patient is a 37yo male admitted for chest pain. The patient either has myocarditis vs. NSTEMI. Pharmacy consulted for Heparin dosing. Reviewed prior meds and no history of prior anticoagulant.  Goal of Therapy:  Heparin level 0.3-0.7 units/ml Monitor platelets by anticoagulation protocol: Yes   Plan:  4/3 @ 1734 HL subtherapeutic at <0.10. Will order 2100 unit heparin bolus and increase heparin infusion to 1000u/hr. Recheck HL in 6 hours.   4/4 0000 heparin level 0.13. 2000 unit bolus and increase rate to 1250 units/hr. Recheck in 6 hours.  4/4@1130 ; HL 0.49. Continue heparin iv 1250units/hr. Recheck in 6 hours   Luan PullingGarrett Hawkin Charo, PharmD, BCGP Clinical Pharmacist 05/17/2017 11:27 AM

## 2017-05-17 NOTE — Care Management (Signed)
Patient lives with his wife and children.  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  Wife is employed and pays most of the household bills.  Patient does not have pcp.  Provided patient and wife with application for [facility].  Instructed on completion and if did this and provided the requested information: picture ID, proof of residence, proof of income- (there is none) and notarized  letter of support (moost likely will be needed) and CM would fax applications. Both verbalized understanding.  patient will need medication assistance at discharge

## 2017-05-17 NOTE — Progress Notes (Signed)
His cardiac cath has been delayed because a new STEMI is being treated in the cath lab, which is a life threatening effect.  Informed him that it will likely be a 2 hour delay.  He's anger about the delay, says he can't not eat for that long and wants to cancel the whole thing.  He wants to talk with a provider.   A few minutes later his wife came and told me that the patient's father is on the phone and told him to stay and have the procedure and Mechele Collinlliott is going to stay.

## 2017-05-17 NOTE — Progress Notes (Signed)
No bleeding nor hematoma at right radial site. Dressing applied, denies complaints. Vitals stable.

## 2017-05-17 NOTE — Progress Notes (Signed)
Patient clinically stable post heart cath with Dr Kirke CorinArida. Vitals are stable TR band intact to right radial. No bleeding nor hematoma at site. Denies complains. Eating supper. Arm elevated to pillow. Sinus rhythm per monitor. Report called to Domenic MorasJancy Johnsone RN with plan reviewed along with questions answered.

## 2017-05-17 NOTE — Progress Notes (Signed)
Refused vital signs. Will continue to monitor.

## 2017-05-17 NOTE — Progress Notes (Signed)
Returned from Cardiac cath. Right radial sight is clean.  Right radial pulse is strong. No c/o pain

## 2017-05-17 NOTE — Progress Notes (Signed)
ANTICOAGULATION CONSULT NOTE - Initial Consult  Pharmacy Consult for Heparin Drip Indication: chest pain/ACS and NSTEMI  No Known Allergies  Patient Measurements: Height: 5\' 7"  (170.2 cm) Weight: 154 lb 1.6 oz (69.9 kg) IBW/kg (Calculated) : 66.1 Heparin Dosing Weight: 68kg  Vital Signs: Temp: 98.6 F (37 C) (04/03 1921) Temp Source: Oral (04/03 1921) BP: 111/70 (04/03 1921) Pulse Rate: 88 (04/03 1921)  Labs: Recent Labs    05/16/17 0840 05/16/17 1100  05/16/17 1257 05/16/17 1734 05/17/17 0010  HGB 14.8  --   --   --   --   --   HCT 46.7  --   --   --   --   --   PLT 244  --   --   --   --   --   APTT  --  29  --   --   --   --   LABPROT  --  13.2  --   --   --   --   INR  --  1.01  --   --   --   --   HEPARINUNFRC  --   --   --   --  <0.10* 0.13*  CREATININE 1.31*  --   --   --   --  1.12  TROPONINI 12.21*  --    < > 13.77* 9.37* 10.33*   < > = values in this interval not displayed.    Estimated Creatinine Clearance: 85.2 mL/min (by C-G formula based on SCr of 1.12 mg/dL).   Medical History: Past Medical History:  Diagnosis Date  . Alcohol abuse   . Marijuana abuse   . Nausea and vomiting   . Tobacco abuse      Assessment: Patient is a 37yo male admitted for chest pain. The patient either has myocarditis vs. NSTEMI. Pharmacy consulted for Heparin dosing. Reviewed prior meds and no history of prior anticoagulant.  Goal of Therapy:  Heparin level 0.3-0.7 units/ml Monitor platelets by anticoagulation protocol: Yes   Plan:  4/3 @ 1734 HL subtherapeutic at <0.10. Will order 2100 unit heparin bolus and increase heparin infusion to 1000u/hr. Recheck HL in 6 hours.   4/4 0000 heparin level 0.13. 2000 unit bolus and increase rate to 1250 units/hr. Recheck in 6 hours.  Erich MontaneMcBane,Tredarius Cobern S, PharmD, BCPS Clinical Pharmacist 05/17/2017 3:17 AM

## 2017-05-18 ENCOUNTER — Telehealth: Payer: Self-pay | Admitting: Cardiovascular Disease

## 2017-05-18 ENCOUNTER — Encounter: Payer: Self-pay | Admitting: Cardiovascular Disease

## 2017-05-18 LAB — BASIC METABOLIC PANEL
Anion gap: 6 (ref 5–15)
BUN: 11 mg/dL (ref 6–20)
CHLORIDE: 110 mmol/L (ref 101–111)
CO2: 22 mmol/L (ref 22–32)
Calcium: 8.1 mg/dL — ABNORMAL LOW (ref 8.9–10.3)
Creatinine, Ser: 1.06 mg/dL (ref 0.61–1.24)
GFR calc non Af Amer: >60 mL/min (ref >60)
Glucose, Bld: 84 mg/dL (ref 65–99)
POTASSIUM: 3.6 mmol/L (ref 3.5–5.1)
Sodium: 138 mmol/L (ref 135–145)

## 2017-05-18 MED ORDER — ATORVASTATIN CALCIUM 40 MG PO TABS: 40.0000 mg | ORAL_TABLET | Freq: Every day | ORAL | 1 refills | Status: DC

## 2017-05-18 MED ORDER — ADULT MULTIVITAMIN W/MINERALS CH: 1.0000 | ORAL_TABLET | Freq: Every day | ORAL | Status: DC

## 2017-05-18 MED ORDER — CLOPIDOGREL BISULFATE 75 MG PO TABS: 75.0000 mg | ORAL_TABLET | Freq: Every day | ORAL | 1 refills | Status: DC

## 2017-05-18 MED ORDER — ASPIRIN 81 MG PO CHEW: 81.0000 mg | CHEWABLE_TABLET | ORAL | 1 refills | Status: DC

## 2017-05-18 MED ORDER — ENSURE ENLIVE PO LIQD: 237.0000 mL | Freq: Two times a day (BID) | ORAL | Status: DC

## 2017-05-18 NOTE — Progress Notes (Signed)
Patient discharged via wheelchair and private vehicle. IV removed and catheter intact. All discharge instructions given and patient verbalizes understanding. Tele removed and returned. Prescriptions given to patient No distress noted.   

## 2017-05-18 NOTE — Telephone Encounter (Signed)
TCM....  Patient is being discharged   They saw Dr Kirke CorinArida and Ward Givenshris Berge   They are scheduled to see Dr Kirke CorinArida on 04/03/17  They were seen for CP  They need to be seen within 2 weeks   Pt was not added wait list   Please call

## 2017-05-18 NOTE — Discharge Instructions (Signed)
Radial Site Care Refer to this sheet in the next few weeks. These instructions provide you with information about caring for yourself after your procedure. Your health care provider may also give you more specific instructions. Your treatment has been planned according to current medical practices, but problems sometimes occur. Call your health care provider if you have any problems or questions after your procedure. What can I expect after the procedure? After your procedure, it is typical to have the following:  Bruising at the radial site that usually fades within 1-2 weeks.  Blood collecting in the tissue (hematoma) that may be painful to the touch. It should usually decrease in size and tenderness within 1-2 weeks.  Follow these instructions at home:  Take medicines only as directed by your health care provider.  You may shower 24-48 hours after the procedure or as directed by your health care provider. Remove the bandage (dressing) and gently wash the site with plain soap and water. Pat the area dry with a clean towel. Do not rub the site, because this may cause bleeding.  Do not take baths, swim, or use a hot tub until your health care provider approves.  Check your insertion site every day for redness, swelling, or drainage.  Do not apply powder or lotion to the site.  Do not flex or bend the affected arm for 24 hours or as directed by your health care provider.  Do not push or pull heavy objects with the affected arm for 24 hours or as directed by your health care provider.  Do not lift over 10 lb (4.5 kg) for 5 days after your procedure or as directed by your health care provider.  Ask your health care provider when it is okay to: ? Return to work or school. ? Resume usual physical activities or sports. ? Resume sexual activity.  Do not drive home if you are discharged the same day as the procedure. Have someone else drive you.  You may drive 24 hours after the procedure  unless otherwise instructed by your health care provider.  Do not operate machinery or power tools for 24 hours after the procedure.  If your procedure was done as an outpatient procedure, which means that you went home the same day as your procedure, a responsible adult should be with you for the first 24 hours after you arrive home.  Keep all follow-up visits as directed by your health care provider. This is important. Contact a health care provider if:  You have a fever.  You have chills.  You have increased bleeding from the radial site. Hold pressure on the site. Get help right away if:  You have unusual pain at the radial site.  You have redness, warmth, or swelling at the radial site.  You have drainage (other than a small amount of blood on the dressing) from the radial site.  The radial site is bleeding, and the bleeding does not stop after 30 minutes of holding steady pressure on the site.  Your arm or hand becomes pale, cool, tingly, or numb. This information is not intended to replace advice given to you by your health care provider. Make sure you discuss any questions you have with your health care provider. Document Released: 03/04/2010 Document Revised: 07/08/2015 Document Reviewed: 08/18/2013 Elsevier Interactive Patient Education  2018 [employer].   Aspirin and Your Heart Aspirin is a medicine that affects the way blood clots. Aspirin can be used to help reduce the risk of  blood clots, heart attacks, and other heart-related problems. Should I take aspirin? Your health care provider will help you determine whether it is safe and beneficial for you to take aspirin daily. Taking aspirin daily may be beneficial if you:  Have had a heart attack or chest pain.  Have undergone open heart surgery such as coronary artery bypass surgery (CABG).  Have had coronary angioplasty.  Have experienced a stroke or transient ischemic attack (TIA).  Have peripheral vascular  disease (PVD).  Have chronic heart rhythm problems such as atrial fibrillation.  Are there any risks of taking aspirin daily? Daily use of aspirin can increase your risk of side effects. Some of these include:  Bleeding. Bleeding problems can be minor or serious. An example of a minor problem is a cut that does not stop bleeding. An example of a more serious problem is stomach bleeding or bleeding into the brain. Your risk of bleeding is increased if you are also taking non-steroidal anti-inflammatory medicine (NSAIDs).  Increased bruising.  Upset stomach.  An allergic reaction. People who have nasal polyps have an increased risk of developing an aspirin allergy.  What are some guidelines I should follow when taking aspirin?  Take aspirin only as directed by your health care provider. Make sure you understand how much you should take and what form you should take. The two forms of aspirin are: ? Non-enteric-coated. This type of aspirin does not have a coating and is absorbed quickly. Non-enteric-coated aspirin is usually recommended for people with chest pain. This type of aspirin also comes in a chewable form. ? Enteric-coated. This type of aspirin has a special coating that releases the medicine very slowly. Enteric-coated aspirin causes less stomach upset than non-enteric-coated aspirin. This type of aspirin should not be chewed or crushed.  Drink alcohol in moderation. Drinking alcohol increases your risk of bleeding. When should I seek medical care?  You have unusual bleeding or bruising.  You have stomach pain.  You have an allergic reaction. Symptoms of an allergic reaction include: ? Hives. ? Itchy skin. ? Swelling of the lips, tongue, or face.  You have ringing in your ears. When should I seek immediate medical care?  Your bowel movements are bloody, dark red, or black in color.  You vomit or cough up blood.  You have blood in your urine.  You cough, wheeze, or feel  short of breath. If you have any of the following symptoms, this is an emergency. Do not wait to see if the pain will go away. Get medical help at once. Call your local emergency services (911 in the [place] Do not drive yourself to the hospital.  You have severe chest pain, especially if the pain is crushing or pressure-like and spreads to the arms, back, neck, or jaw.  You have stroke-like symptoms, such as: ? Loss of vision. ? Difficulty talking. ? Numbness or weakness on one side of your body. ? Numbness or weakness in your arm or leg. ? Not thinking clearly or feeling confused.  This information is not intended to replace advice given to you by your health care provider. Make sure you discuss any questions you have with your health care provider. Document Released: 01/13/2008 Document Revised: 06/09/2015 Document Reviewed: 05/07/2013 Elsevier Interactive Patient Education  2018 [employer] Atorvastatin tablets What is this medicine? ATORVASTATIN (a TORE va sta tin) is known as a HMG-CoA reductase inhibitor or 'statin'. It lowers the level of cholesterol and triglycerides in the blood. This  drug may also reduce the risk of heart attack, stroke, or other health problems in patients with risk factors for heart disease. Diet and lifestyle changes are often used with this drug. This medicine may be used for other purposes; ask your health care provider or pharmacist if you have questions. COMMON BRAND NAME(S): Lipitor What should I tell my health care provider before I take this medicine? They need to know if you have any of these conditions: -frequently drink alcoholic beverages -history of stroke, TIA -kidney disease -liver disease -muscle aches or weakness -other medical condition -an unusual or allergic reaction to atorvastatin, other medicines, foods, dyes, or preservatives -pregnant or trying to get pregnant -breast-feeding How should I use this medicine? Take this medicine by  mouth with a glass of water. Follow the directions on the prescription label. You can take this medicine with or without food. Take your doses at regular intervals. Do not take your medicine more often than directed. Talk to your pediatrician regarding the use of this medicine in children. While this drug may be prescribed for children as young as 69 years old for selected conditions, precautions do apply. Overdosage: If you think you have taken too much of this medicine contact a poison control center or emergency room at once. NOTE: This medicine is only for you. Do not share this medicine with others. What if I miss a dose? If you miss a dose, take it as soon as you can. If it is almost time for your next dose, take only that dose. Do not take double or extra doses. What may interact with this medicine? Do not take this medicine with any of the following medications: -red yeast rice -telaprevir -telithromycin -voriconazole This medicine may also interact with the following medications: -alcohol -antiviral medicines for HIV or AIDS -boceprevir -certain antibiotics like clarithromycin, erythromycin, troleandomycin -certain medicines for cholesterol like fenofibrate or gemfibrozil -cimetidine -clarithromycin -colchicine -cyclosporine -digoxin -male hormones, like estrogens or progestins and birth control pills -grapefruit juice -medicines for fungal infections like fluconazole, itraconazole, ketoconazole -niacin -rifampin -spironolactone This list may not describe all possible interactions. Give your health care provider a list of all the medicines, herbs, non-prescription drugs, or dietary supplements you use. Also tell them if you smoke, drink alcohol, or use illegal drugs. Some items may interact with your medicine. What should I watch for while using this medicine? Visit your doctor or health care professional for regular check-ups. You may need regular tests to make sure your  liver is working properly. Tell your doctor or health care professional right away if you get any unexplained muscle pain, tenderness, or weakness, especially if you also have a fever and tiredness. Your doctor or health care professional may tell you to stop taking this medicine if you develop muscle problems. If your muscle problems do not go away after stopping this medicine, contact your health care professional. This drug is only part of a total heart-health program. Your doctor or a dietician can suggest a low-cholesterol and low-fat diet to help. Avoid alcohol and smoking, and keep a proper exercise schedule. Do not use this drug if you are pregnant or breast-feeding. Serious side effects to an unborn child or to an infant are possible. Talk to your doctor or pharmacist for more information. This medicine may affect blood sugar levels. If you have diabetes, check with your doctor or health care professional before you change your diet or the dose of your diabetic medicine. If you are going to  have surgery tell your health care professional that you are taking this drug. What side effects may I notice from receiving this medicine? Side effects that you should report to your doctor or health care professional as soon as possible: -allergic reactions like skin rash, itching or hives, swelling of the face, lips, or tongue -dark urine -fever -joint pain -muscle cramps, pain -redness, blistering, peeling or loosening of the skin, including inside the mouth -trouble passing urine or change in the amount of urine -unusually weak or tired -yellowing of eyes or skin Side effects that usually do not require medical attention (report to your doctor or health care professional if they continue or are bothersome): -constipation -heartburn -stomach gas, pain, upset This list may not describe all possible side effects. Call your doctor for medical advice about side effects. You may report side effects to  FDA at 1-800-FDA-1088. Where should I keep my medicine? Keep out of the reach of children. Store at room temperature between 20 to 25 degrees C (68 to 77 degrees F). Throw away any unused medicine after the expiration date. NOTE: This sheet is a summary. It may not cover all possible information. If you have questions about this medicine, talk to your doctor, pharmacist, or health care provider.  2018 Elsevier/Gold Standard (2010-12-20 16:10:96) Clopidogrel tablets What is this medicine? CLOPIDOGREL (kloh PID oh grel) helps to prevent blood clots. This medicine is used to prevent heart attack, stroke, or other vascular events in people who are at high risk. This medicine may be used for other purposes; ask your health care provider or pharmacist if you have questions. COMMON BRAND NAME(S): Plavix What should I tell my health care provider before I take this medicine? They need to know if you have any of the following conditions: -bleeding disorders -bleeding in the brain -having surgery -history of stomach bleeding -an unusual or allergic reaction to clopidogrel, other medicines, foods, dyes, or preservatives -pregnant or trying to get pregnant -breast-feeding How should I use this medicine? Take this medicine by mouth with a glass of water. Follow the directions on the prescription label. You may take this medicine with or without food. If it upsets your stomach, take it with food. Take your medicine at regular intervals. Do not take it more often than directed. Do not stop taking except on your doctor's advice. A special MedGuide will be given to you by the pharmacist with each prescription and refill. Be sure to read this information carefully each time. Talk to your pediatrician regarding the use of this medicine in children. Special care may be needed. Overdosage: If you think you have taken too much of this medicine contact a poison control center or emergency room at once. NOTE: This  medicine is only for you. Do not share this medicine with others. What if I miss a dose? If you miss a dose, take it as soon as you can. If it is almost time for your next dose, take only that dose. Do not take double or extra doses. What may interact with this medicine? Do not take this medicine with the following medications: -dasabuvir; ombitasvir; paritaprevir; ritonavir -defibrotide This medicine may also interact with the following medications: -antiviral medicines for HIV or AIDS -aspirin -certain medicines for depression like citalopram, fluoxetine, fluvoxamine -certain medicines for fungal infections like ketoconazole, fluconazole, voriconazole -certain medicines for seizures like felbamate, oxcarbazepine, phenytoin -certain medicines for stomach problems like cimetidine, omeprazole, esomeprazole -certain medicines that treat or prevent blood clots like warfarin,  enoxaparin, dalteparin, apixaban, dabigatran, rivaroxaban, ticlopidine -chloramphenicol -cilostazol -fluvastatin -isoniazid -modafinil -nicardipine -NSAIDS, medicines for pain and inflammation, like ibuprofen or naproxen -quinine -repaglinide -tamoxifen -tolbutamide -topiramate -torsemide This list may not describe all possible interactions. Give your health care provider a list of all the medicines, herbs, non-prescription drugs, or dietary supplements you use. Also tell them if you smoke, drink alcohol, or use illegal drugs. Some items may interact with your medicine. What should I watch for while using this medicine? Visit your doctor or health care professional for regular check ups. Do not stop taking your medicine unless your doctor tells you to. Notify your doctor or health care professional and seek emergency treatment if you develop breathing problems; changes in vision; chest pain; severe, sudden headache; pain, swelling, warmth in the leg; trouble speaking; sudden numbness or weakness of the face, arm or  leg. These can be signs that your condition has gotten worse. If you are going to have surgery or dental work, tell your doctor or health care professional that you are taking this medicine. Certain genetic factors may reduce the effect of this medicine. Your doctor may use genetic tests to determine treatment. What side effects may I notice from receiving this medicine? Side effects that you should report to your doctor or health care professional as soon as possible: -allergic reactions like skin rash, itching or hives, swelling of the face, lips, or tongue -signs and symptoms of bleeding such as bloody or black, tarry stools; red or dark-brown urine; spitting up blood or brown material that looks like coffee grounds; red spots on the skin; unusual bruising or bleeding from the eye, gums, or nose -signs and symptoms of a blood clot such as breathing problems; changes in vision; chest pain; severe, sudden headache; pain, swelling, warmth in the leg; trouble speaking; sudden numbness or weakness of the face, arm or leg Side effects that usually do not require medical attention (report to your doctor or health care professional if they continue or are bothersome): -constipation -diarrhea -headache -upset stomach This list may not describe all possible side effects. Call your doctor for medical advice about side effects. You may report side effects to FDA at 1-800-FDA-1088. Where should I keep my medicine? Keep out of the reach of children. Store at room temperature of 59 to 86 degrees F (15 to 30 degrees C). Throw away any unused medicine after the expiration date. NOTE: This sheet is a summary. It may not cover all possible information. If you have questions about this medicine, talk to your doctor, pharmacist, or health care provider.  2018 Elsevier/Gold Standard (2014-11-05 10:00:44)

## 2017-05-18 NOTE — Progress Notes (Signed)
This is to notify that Mr Dean Boyd was in hospital from 05/16/17 till 05/18/17. During my visit Ms. Lakita Pettiford was in room.  For further questions, please call [phone].  Thanks. -Dr. Elisabeth Pigeonvachhani

## 2017-05-18 NOTE — Progress Notes (Signed)
Progress Note  Patient Name: Dean Boyd Date of Encounter: 05/18/2017  Primary Cardiologist: Yvonne Kendallhristopher End, MD  Subjective   No chest pain or sob.  Ambulating w/o difficulty.  Eager to go home.  Inpatient Medications    Scheduled Meds: . aspirin  81 mg Oral Pre-Cath  . aspirin EC  81 mg Oral Daily  . atorvastatin  40 mg Oral q1800  . clopidogrel  75 mg Oral Q breakfast  . sodium chloride flush  3 mL Intravenous Q12H  . sodium chloride flush  3 mL Intravenous Q12H   Continuous Infusions: . sodium chloride 75 mL/hr at 05/16/17 1325  . sodium chloride    . sodium chloride    . sodium chloride     PRN Meds: sodium chloride, sodium chloride, acetaminophen **OR** acetaminophen, ondansetron **OR** ondansetron (ZOFRAN) IV, sodium chloride flush, sodium chloride flush, traMADol   Vital Signs    Vitals:   05/17/17 1830 05/17/17 1900 05/17/17 2016 05/18/17 0356  BP: 105/67 102/62 107/78 103/82  Pulse: 87 98 91 77  Resp: (!) 22 (!) 21 18 18   Temp:   98.3 F (36.8 C) 98.3 F (36.8 C)  TempSrc:   Oral Oral  SpO2: 98% 97% 96% 98%  Weight:      Height:        Intake/Output Summary (Last 24 hours) at 05/18/2017 0832 Last data filed at 05/18/2017 0404 Gross per 24 hour  Intake 2898.75 ml  Output 1 ml  Net 2897.75 ml   Filed Weights   05/16/17 0800 05/16/17 1251 05/17/17 1607  Weight: 150 lb (68 kg) 154 lb 1.6 oz (69.9 kg) 154 lb (69.9 kg)    Physical Exam   GEN: Well nourished, well developed, in no acute distress.  HEENT: Grossly normal.  Neck: Supple, no JVD, carotid bruits, or masses. Cardiac: RRR, no murmurs, rubs, or gallops. No clubbing, cyanosis, edema.  Radials/DP/PT 2+ and equal bilaterally. R wrist cath site w/o bleeding/bruit/hematoma. Respiratory:  Respirations regular and unlabored, clear to auscultation bilaterally. GI: Soft, nontender, nondistended, BS + x 4. MS: no deformity or atrophy. Skin: warm and dry, no rash. Neuro:  Strength and sensation  are intact. Psych: AAOx3.  Normal affect.  Labs    Chemistry Recent Labs  Lab 05/13/17 1224 05/16/17 0840 05/17/17 0010 05/18/17 0412  NA 138 136 139 138  K 3.6 4.4 3.2* 3.6  CL 105 100* 106 110  CO2 21* 22 25 22   GLUCOSE 137* 110* 92 84  BUN 8 12 11 11   CREATININE 1.04 1.31* 1.12 1.06  CALCIUM 8.9 9.1 8.3* 8.1*  PROT 7.1 7.9  --   --   ALBUMIN 3.7 3.8  --   --   AST 30 72*  --   --   ALT 19 33  --   --   ALKPHOS 47 51  --   --   BILITOT 0.8 2.0*  --   --   GFRNONAA >60 >60 >60 >60  GFRAA >60 >60 >60 >60  ANIONGAP 12 14 8 6      Hematology Recent Labs  Lab 05/13/17 1224 05/16/17 0840 05/17/17 1010  WBC 19.2* 21.0* 13.1*  RBC 5.06 5.19 4.28*  HGB 14.6 14.8 12.2*  HCT 45.2 46.7 38.7*  MCV 89.3 90.1 90.5  MCH 28.8 28.5 28.6  MCHC 32.3 31.6* 31.5*  RDW 13.9 13.7 13.6  PLT 232 244 223    Cardiac Enzymes Recent Labs  Lab 05/16/17 1144 05/16/17 1257 05/16/17 1734 05/17/17 0010  TROPONINI 13.52* 13.77* 9.37* 10.33*     BNP Recent Labs  Lab 05/16/17 0840  BNP 228.0*    Radiology    Dg Chest Port 1 View  Result Date: 05/16/2017 CLINICAL DATA:  Chest pain EXAM: PORTABLE CHEST 1 VIEW COMPARISON:  05/13/2017 FINDINGS: The heart size and mediastinal contours are within normal limits. Both lungs are clear. The visualized skeletal structures are unremarkable. IMPRESSION: No active disease. Electronically Signed   By: Elige Ko   On: 05/16/2017 09:05    Telemetry    rsr - Personally Reviewed  Cardiac Studies   2D Echocardiogram 4.3.2019  Study Conclusions   - Left ventricle: The cavity size was normal. There was mild   asymmetric hypertrophy of the posterior wall. Systolic function   was normal. The estimated ejection fraction was in the range of   50% to 55%. There is severe hypokinesis of the   basal-midinferolateral myocardium. - Mitral valve: There was mild regurgitation. - Right ventricle: The cavity size was normal. Systolic function   was  normal. _____________   Cardiac Catheterization 4.4.2019  Left Anterior Descending  Vessel is angiographically normal.  First Diagonal Branch  Vessel is angiographically normal.  Second Diagonal Branch  Vessel is angiographically normal.  Third Diagonal Branch  Vessel is angiographically normal.  Ramus Intermedius  The vessel exhibits minimal luminal irregularities.  Left Circumflex  Collaterals  Dist Cx filled by collaterals from Post Atrio.    Prox Cx to Mid Cx lesion 100% stenosed  Prox Cx to Mid Cx lesion is 100% stenosed.  First Obtuse Marginal Branch  Vessel is small in size. The vessel exhibits minimal luminal irregularities.  Right Coronary Artery  The vessel exhibits minimal luminal irregularities.  Right Posterior Descending Artery  The vessel exhibits minimal luminal irregularities.  Right Posterior Atrioventricular Branch  The vessel exhibits minimal luminal irregularities.  First Right Posterolateral  The vessel exhibits minimal luminal irregularities.  Second Right Posterolateral  The vessel exhibits minimal luminal irregularities.  _____________   Patient Profile     37 y.o. male w/ a h/o polysubstance abuse admitted 4/3 with recurrent cp and elevated trop after presenting over the weekend with similar.  Assessment & Plan    1.  Late presenting inferolateral MI/CAD:  S/p cath yesterday revealing a total occlusion of the LCX w/ R  L collaterals.  Ostial RCA spasm also noted.  Med Rx recommended with consideration for PCI only if recurrent Ss.  EF 50-55% by echo.  No recurrent c/p or dyspnea.  Long discussion re: lifestyle modification and compliance w/ med Rx.  Cont asa, statin, plavix.  No  blocker 2/2 soft bp's.  Ok to d/c today and we will arrange for 7 day f/u.  2.  Lipids:  LDL 59.  Cont high potency statin.  3.  Polysubstance abuse:  Strongly advised to quit etoh, tob, marijuana.  Wife in room during discussion.  Pt seems motivated.  4.  N/V: no  recurrence.  Suspect this may have been part of his angina/MI.  5.  Hypokalemia: K nl this AM.  Signed, Nicolasa Ducking, NP  05/18/2017, 8:32 AM    For questions or updates, please contact   Please consult [email or web address] for contact info under Cardiology/STEMI.

## 2017-05-18 NOTE — Progress Notes (Signed)
[facility] -  at [facility]   PATIENT NAME: Dean Boyd    MR#:  [id number]  DATE OF BIRTH:  [birth year]  SUBJECTIVE:  CHIEF COMPLAINT:   Chief Complaint  Patient presents with  . Chest Pain   Came with chest pain, have high troponin, on heparin drip, plan for cath later today.  REVIEW OF SYSTEMS:  CONSTITUTIONAL: No fever, fatigue or weakness.  EYES: No blurred or double vision.  EARS, NOSE, AND THROAT: No tinnitus or ear pain.  RESPIRATORY: No cough, shortness of breath, wheezing or hemoptysis.  CARDIOVASCULAR: No chest pain, orthopnea, edema.  GASTROINTESTINAL: No nausea, vomiting, diarrhea or abdominal pain.  GENITOURINARY: No dysuria, hematuria.  ENDOCRINE: No polyuria, nocturia,  HEMATOLOGY: No anemia, easy bruising or bleeding SKIN: No rash or lesion. MUSCULOSKELETAL: No joint pain or arthritis.   NEUROLOGIC: No tingling, numbness, weakness.  PSYCHIATRY: No anxiety or depression.   ROS  DRUG ALLERGIES:  No Known Allergies  VITALS:  Blood pressure 103/82, pulse 77, temperature 98.3 F (36.8 C), temperature source Oral, resp. rate 18, height 5\' 7"  (1.702 m), weight 69.9 kg (154 lb), SpO2 98 %.  PHYSICAL EXAMINATION:  GENERAL:  37 y.o.-year-old patient lying in the bed with no acute distress.  EYES: Pupils equal, round, reactive to light and accommodation. No scleral icterus. Extraocular muscles intact.  HEENT: Head atraumatic, normocephalic. Oropharynx and nasopharynx clear.  NECK:  Supple, no jugular venous distention. No thyroid enlargement, no tenderness.  LUNGS: Normal breath sounds bilaterally, no wheezing, rales,rhonchi or crepitation. No use of accessory muscles of respiration.  CARDIOVASCULAR: S1, S2 normal. No murmurs, rubs, or gallops.  ABDOMEN: Soft, nontender, nondistended. Bowel sounds present. No organomegaly or mass.  EXTREMITIES: No pedal edema, cyanosis, or clubbing.  NEUROLOGIC: Cranial nerves II through XII are intact.  Muscle strength 5/5 in all extremities. Sensation intact. Gait not checked.  PSYCHIATRIC: The patient is alert and oriented x 3.  SKIN: No obvious rash, lesion, or ulcer.   Physical Exam LABORATORY PANEL:   CBC Recent Labs  Lab 05/17/17 1010  WBC 13.1*  HGB 12.2*  HCT 38.7*  PLT 223   ------------------------------------------------------------------------------------------------------------------  Chemistries  Recent Labs  Lab 05/16/17 0840 05/16/17 1257  05/18/17 0412  NA 136  --    < > 138  K 4.4  --    < > 3.6  CL 100*  --    < > 110  CO2 22  --    < > 22  GLUCOSE 110*  --    < > 84  BUN 12  --    < > 11  CREATININE 1.31*  --    < > 1.06  CALCIUM 9.1  --    < > 8.1*  MG  --  1.7  --   --   AST 72*  --   --   --   ALT 33  --   --   --   ALKPHOS 51  --   --   --   BILITOT 2.0*  --   --   --    < > = values in this interval not displayed.   ------------------------------------------------------------------------------------------------------------------  Cardiac Enzymes Recent Labs  Lab 05/16/17 1734 05/17/17 0010  TROPONINI 9.37* 10.33*   ------------------------------------------------------------------------------------------------------------------  RADIOLOGY:  Dg Chest Port 1 View  Result Date: 05/16/2017 CLINICAL DATA:  Chest pain EXAM: PORTABLE CHEST 1 VIEW COMPARISON:  05/13/2017 FINDINGS: The heart size and mediastinal contours are within normal limits.  Both lungs are clear. The visualized skeletal structures are unremarkable. IMPRESSION: No active disease. Electronically Signed   By: Elige KoHetal  Patel   On: 05/16/2017 09:05    ASSESSMENT AND PLAN:   Active Problems:   Chest pain   Non-ST elevation myocardial infarction (NSTEMI) (HCC)  Dean Boyd  is a 37 y.o. male with a known history of polysubstance drug abuse, history of cyclical vomiting comes to the emergency room with chest pain and intractable vomiting.  1. Acute non-ST EMI - monitor  on telemetry floor -cardiology consultation placed. Recommends cardiac catheterization. -Continue aspirin, Lipitor, IV heparin drip  2. Ongoing marijuana use -advised abstinence  3. Intractable nausea vomiting IV fluids PRN Zofran  4. DVT prophylaxis IV heparin drip  5. Smoking    Counseled to Quit- for 4 min.  All the records are reviewed and case discussed with Care Management/Social Workerr. Management plans discussed with the patient, family and they are in agreement.  CODE STATUS: full.  TOTAL TIME TAKING CARE OF THIS PATIENT: 35 minutes.     POSSIBLE D/C IN 1-2 DAYS, DEPENDING ON CLINICAL CONDITION.   Altamese DillingVaibhavkumar Nathali Vent M.D on 05/18/2017   Between 7am to 6pm - Pager - [phone]  After 6pm go to [email or web address] - password [email or web address]  [facility] [place] [facility]  Office  [phone]  CC: Primary care physician; Patient, No Pcp Per  Note: This dictation was prepared with Dragon dictation along with smaller phrase technology. Any transcriptional errors that result from this process are unintentional.

## 2017-05-18 NOTE — Telephone Encounter (Signed)
Left message on machine for patient to contact the office.   

## 2017-05-18 NOTE — Discharge Summary (Signed)
[facility] - [place] at [facility]   PATIENT NAME: Dean Boyd    MR#:  [id number]  DATE OF BIRTH:  [birth year]  DATE OF ADMISSION:  05/16/2017 ADMITTING PHYSICIAN: Enedina FinnerSona Patel, MD  DATE OF DISCHARGE: 05/18/2017   PRIMARY CARE PHYSICIAN: Patient, No Pcp Per    ADMISSION DIAGNOSIS:  Non-ST elevation myocardial infarction (NSTEMI) (HCC) [I21.4]  DISCHARGE DIAGNOSIS:  Active Problems:   Chest pain   Non-ST elevation myocardial infarction (NSTEMI) (HCC)   SECONDARY DIAGNOSIS:   Past Medical History:  Diagnosis Date  . Alcohol abuse   . Marijuana abuse   . Nausea and vomiting   . Tobacco abuse     HOSPITAL COURSE:   AnthonyElliottis a37 y.o.malewith a known history of polysubstance drug abuse, history of cyclical vomiting comes to the emergency room with chest pain and intractable vomiting.  1. Acute non-ST EMI - monitor on telemetry floor -cardiology consultation placed. Recommends cardiac catheterization. -Continue aspirin, Lipitor, IV heparin drip - As per cardiac cath- he had one vessel disease, but there were collaterals , so no stents placed, advised ASA + Plavix. Cont statin. As BP on lower normal side, no room for betablocker or ACE this time. Follow in cardio clinic and cardiac rehab as advised.  2.Ongoing marijuana use -advised abstinence  3.Intractable nausea vomiting IV fluids PRN Zofran  4.DVT prophylaxis IV heparin drip  5. Smoking    Counseled to Quit- for 4 min.    DISCHARGE CONDITIONS:   Stable.  CONSULTS OBTAINED:  Treatment Team:  Yvonne KendallEnd, Christopher, MD  DRUG ALLERGIES:  No Known Allergies  DISCHARGE MEDICATIONS:   Allergies as of 05/18/2017   No Known Allergies     Medication List    STOP taking these medications   ciprofloxacin 500 MG tablet Commonly known as:  CIPRO   dicyclomine 20 MG tablet Commonly known as:  BENTYL   ondansetron 4 MG tablet Commonly known as:  ZOFRAN    potassium chloride SA 20 MEQ tablet Commonly known as:  KLOR-CON M20     TAKE these medications   aspirin 81 MG chewable tablet Chew 1 tablet (81 mg total) by mouth before cath procedure. Start taking on:  05/19/2017   atorvastatin 40 MG tablet Commonly known as:  LIPITOR Take 1 tablet (40 mg total) by mouth daily at 6 PM.   clopidogrel 75 MG tablet Commonly known as:  PLAVIX Take 1 tablet (75 mg total) by mouth daily with breakfast.        DISCHARGE INSTRUCTIONS:    Follow with cardio clinic in 1-2 weeks.  If you experience worsening of your admission symptoms, develop shortness of breath, life threatening emergency, suicidal or homicidal thoughts you must seek medical attention immediately by calling 911 or calling your MD immediately  if symptoms less severe.  You Must read complete instructions/literature along with all the possible adverse reactions/side effects for all the Medicines you take and that have been prescribed to you. Take any new Medicines after you have completely understood and accept all the possible adverse reactions/side effects.   Please note  You were cared for by a hospitalist during your hospital stay. If you have any questions about your discharge medications or the care you received while you were in the hospital after you are discharged, you can call the unit and asked to speak with the hospitalist on call if the hospitalist that took care of you is not available. Once you are discharged, your primary care physician  will handle any further medical issues. Please note that NO REFILLS for any discharge medications will be authorized once you are discharged, as it is imperative that you return to your primary care physician (or establish a relationship with a primary care physician if you do not have one) for your aftercare needs so that they can reassess your need for medications and monitor your lab values.    Today   CHIEF COMPLAINT:   Chief  Complaint  Patient presents with  . Chest Pain    HISTORY OF PRESENT ILLNESS:  Dean Boyd  is a 37 y.o. male with a known history of polysubstance drug abuse, history of cyclical vomiting comes to the emergency room with chest pain and intractable vomiting. Patient was here a couple days ago went AMA. At that time his troponin was .22 to the emergency room today troponin was 12.1 he was started on IV heparin trip and admitted with acute NIST EMI    VITAL SIGNS:  Blood pressure 122/86, pulse 81, temperature 98.3 F (36.8 C), temperature source Oral, resp. rate 18, height 5\' 7"  (1.702 m), weight 69.9 kg (154 lb), SpO2 100 %.  I/O:    Intake/Output Summary (Last 24 hours) at 05/18/2017 0950 Last data filed at 05/18/2017 0404 Gross per 24 hour  Intake 2898.75 ml  Output 1 ml  Net 2897.75 ml    PHYSICAL EXAMINATION:  GENERAL:  37 y.o.-year-old patient lying in the bed with no acute distress.  EYES: Pupils equal, round, reactive to light and accommodation. No scleral icterus. Extraocular muscles intact.  HEENT: Head atraumatic, normocephalic. Oropharynx and nasopharynx clear.  NECK:  Supple, no jugular venous distention. No thyroid enlargement, no tenderness.  LUNGS: Normal breath sounds bilaterally, no wheezing, rales,rhonchi or crepitation. No use of accessory muscles of respiration.  CARDIOVASCULAR: S1, S2 normal. No murmurs, rubs, or gallops.  ABDOMEN: Soft, non-tender, non-distended. Bowel sounds present. No organomegaly or mass.  EXTREMITIES: No pedal edema, cyanosis, or clubbing.  NEUROLOGIC: Cranial nerves II through XII are intact. Muscle strength 5/5 in all extremities. Sensation intact. Gait not checked.  PSYCHIATRIC: The patient is alert and oriented x 3.  SKIN: No obvious rash, lesion, or ulcer.   DATA REVIEW:   CBC Recent Labs  Lab 05/17/17 1010  WBC 13.1*  HGB 12.2*  HCT 38.7*  PLT 223    Chemistries  Recent Labs  Lab 05/16/17 0840 05/16/17 1257   05/18/17 0412  NA 136  --    < > 138  K 4.4  --    < > 3.6  CL 100*  --    < > 110  CO2 22  --    < > 22  GLUCOSE 110*  --    < > 84  BUN 12  --    < > 11  CREATININE 1.31*  --    < > 1.06  CALCIUM 9.1  --    < > 8.1*  MG  --  1.7  --   --   AST 72*  --   --   --   ALT 33  --   --   --   ALKPHOS 51  --   --   --   BILITOT 2.0*  --   --   --    < > = values in this interval not displayed.    Cardiac Enzymes Recent Labs  Lab 05/17/17 0010  TROPONINI 10.33*    Microbiology Results  Results for  orders placed or performed during the hospital encounter of 05/12/16  Gastrointestinal Panel by PCR , Stool     Status: Abnormal   Collection Time: 05/12/16 10:53 PM  Result Value Ref Range Status   Campylobacter species NOT DETECTED NOT DETECTED Final   Plesimonas shigelloides NOT DETECTED NOT DETECTED Final   Salmonella species NOT DETECTED NOT DETECTED Final   Yersinia enterocolitica NOT DETECTED NOT DETECTED Final   Vibrio species NOT DETECTED NOT DETECTED Final   Vibrio cholerae NOT DETECTED NOT DETECTED Final   Enteroaggregative E coli (EAEC) DETECTED (A) NOT DETECTED Final    Comment: RESULT CALLED TO, READ BACK BY AND VERIFIED WITH: Wynn Banker AT 1610 05/13/16.PMH    Enteropathogenic E coli (EPEC) NOT DETECTED NOT DETECTED Final   Enterotoxigenic E coli (ETEC) NOT DETECTED NOT DETECTED Final   Shiga like toxin producing E coli (STEC) NOT DETECTED NOT DETECTED Final   Shigella/Enteroinvasive E coli (EIEC) NOT DETECTED NOT DETECTED Final   Cryptosporidium NOT DETECTED NOT DETECTED Final   Cyclospora cayetanensis NOT DETECTED NOT DETECTED Final   Entamoeba histolytica NOT DETECTED NOT DETECTED Final   Giardia lamblia NOT DETECTED NOT DETECTED Final   Adenovirus F40/41 NOT DETECTED NOT DETECTED Final   Astrovirus NOT DETECTED NOT DETECTED Final   Norovirus GI/GII NOT DETECTED NOT DETECTED Final   Rotavirus A NOT DETECTED NOT DETECTED Final   Sapovirus (I, II, IV, and V)  NOT DETECTED NOT DETECTED Final    RADIOLOGY:  No results found.  EKG:   Orders placed or performed during the hospital encounter of 05/16/17  . EKG 12-Lead  . EKG 12-Lead  . ED EKG  . ED EKG  . EKG 12-Lead  . EKG 12-Lead      Management plans discussed with the patient, family and they are in agreement.  CODE STATUS:     Code Status Orders  (From admission, onward)        Start     Ordered   05/16/17 1237  Full code  Continuous     05/16/17 1237    Code Status History    Date Active Date Inactive Code Status Order ID Comments User Context   05/12/2016 0557 05/14/2016 1400 Full Code [id number]  Hugelmeyer, Jon Gills, DO Inpatient      TOTAL TIME TAKING CARE OF THIS PATIENT: 35 minutes.    Altamese Dilling M.D on 05/18/2017 at 9:50 AM  Between 7am to 6pm - Pager - [phone]  After 6pm go to [email or web address] - password [email or web address]  [facility] [place] [facility]  Office  [phone]  CC: Primary care physician; Patient, No Pcp Per   Note: This dictation was prepared with Dragon dictation along with smaller phrase technology. Any transcriptional errors that result from this process are unintentional.

## 2017-05-21 NOTE — Telephone Encounter (Signed)
I left a message for the patient to call. 

## 2017-05-22 NOTE — Telephone Encounter (Signed)
Patient [facility] on 05[facility].  Patient understands to follow up with provider Ward Givenshris Berge on 4/10 at 10[facility] [place]. Patient understands discharge instructions? yesy Patient understands medications and regiment? yes Patient understands to bring all medications to this visit? yes   Provided directions to the office.

## 2017-05-23 ENCOUNTER — Ambulatory Visit (INDEPENDENT_AMBULATORY_CARE_PROVIDER_SITE_OTHER): Payer: Self-pay | Admitting: Internal Medicine

## 2017-05-23 ENCOUNTER — Encounter: Payer: Self-pay | Admitting: Internal Medicine

## 2017-05-23 VITALS — BP 106/78 | HR 75 | Ht 67.0 in | Wt 166.8 lb

## 2017-05-23 DIAGNOSIS — I2121 ST elevation (STEMI) myocardial infarction involving left circumflex coronary artery: Secondary | ICD-10-CM

## 2017-05-23 DIAGNOSIS — F1910 Other psychoactive substance abuse, uncomplicated: Secondary | ICD-10-CM

## 2017-05-23 DIAGNOSIS — I213 ST elevation (STEMI) myocardial infarction of unspecified site: Secondary | ICD-10-CM | POA: Insufficient documentation

## 2017-05-23 MED ORDER — METOPROLOL SUCCINATE ER 25 MG PO TB24: 12.5000 mg | ORAL_TABLET | Freq: Every day | ORAL | 2 refills | Status: DC

## 2017-05-23 MED ORDER — NITROGLYCERIN 0.4 MG SL SUBL: 0.4000 mg | SUBLINGUAL_TABLET | SUBLINGUAL | 3 refills | Status: DC | PRN

## 2017-05-23 MED ORDER — ATORVASTATIN CALCIUM 20 MG PO TABS: 20.0000 mg | ORAL_TABLET | Freq: Every day | ORAL | 3 refills | Status: DC

## 2017-05-23 NOTE — Progress Notes (Signed)
Follow-up Outpatient Visit Date: 05/23/2017  Primary Care Provider: Patient, No Pcp Per No address on file  Chief Complaint: Chest pain  HPI:  [name] is a 37 y.o. year-old male with history of coronary artery disease with recent STEMI and occluded LCx that was managed medically due to late presentation, recurrent nausea and abdominal pain, and polysubstance abuse, who presents for follow-up of coronary artery disease.  [name] [facility] on 05/13/17 with chest pain, abdominal pain, and nausea.  EKG at that time was concerning for inferior STEMI.  CTA performed at that time also revealed inferolateral subendocardial hypoattenuation that could be seen with MI.  However, [name] left AGAINST MEDICAL ADVICE and did not return until 05/16/17 with recurrent chest pain mild abdominal pain, and nausea.  Troponin was notably elevated (peaking at 13.8) but trended down shortly after admission.  Subsequent cardiac catheterization revealed occluded mid LCx with right to left collaterals.  PCI was not attempted, as his symptoms began more than 72 hours prior to catheterization.  Remainder of his coronary arteries had minimal luminal irregularities.  Since leaving the hospital, [name] has had 3 episodes of chest discomfort similar to what he experienced last week.  They have been a little less severe and typically resolve spontaneously after about 30 minutes.  He has accompanying abdominal discomfort and shortness of breath.  He also feels as though his stomach is in a knot in the mornings.  His thighs have also been somewhat stiff and crampy; he wonders if this is related to having been in bed for several days last week while hospitalized.  He is compliant with his medications including atorvastatin, aspirin, and clopidogrel.  He has not smoked any marijuana since leaving the hospital and is in the process of trying to cut down on cigarettes.  He denies other  illicit drug use.  He drank 1 beer since leaving the hospital and noted some GI upset.  He plans to refrain from alcohol going forward.  --------------------------------------------------------------------------------------------------  Past Medical History:  Diagnosis Date  . Alcohol abuse   . Coronary artery disease 05/16/2017   Late presenting STEMI with occluded LCx - managed medically  . Marijuana abuse   . Nausea and vomiting   . Tobacco abuse     Recent CV Pertinent Labs: Lab Results  Component Value Date   CHOL 138 05/17/2017   HDL 49 05/17/2017   LDLCALC 59 05/17/2017   TRIG 148 05/17/2017   CHOLHDL 2.8 05/17/2017   INR 1.01 05/16/2017   BNP 228.0 (H) 05/16/2017   K 3.6 05/18/2017   MG 1.7 05/16/2017   BUN 11 05/18/2017   CREATININE 1.06 05/18/2017    Past medical and surgical history were reviewed and updated in EPIC.  Current Meds  Medication Sig  . aspirin 81 MG chewable tablet Chew 1 tablet (81 mg total) by mouth before cath procedure.  . clopidogrel (PLAVIX) 75 MG tablet Take 1 tablet (75 mg total) by mouth daily with breakfast.  . [DISCONTINUED] atorvastatin (LIPITOR) 40 MG tablet Take 1 tablet (40 mg total) by mouth daily at 6 PM.    Allergies: Patient has no known allergies.  Social History   Socioeconomic History  . Marital status: Married    Spouse name: Not on file  . Number of children: Not on file  . Years of education: Not on file  . Highest education level: Not on file  Occupational History  . Occupation: Unemployed  Social Needs  . Financial resource strain: Not on file  . Food insecurity:    Worry: Not on file    Inability: Not on file  . Transportation needs:    Medical: Not on file    Non-medical: Not on file  Tobacco Use  . Smoking status: Current Every Day Smoker    Packs/day: 0.25    Years: 15.00    Pack years: 3.75    Types: Cigarettes  . Smokeless tobacco: Never Used  Substance and Sexual Activity  . Alcohol use: Yes      Comment: 50-60oz/beer per day (~ 5 bottles)  . Drug use: Yes    Types: Marijuana    Comment: Uses multiple mj cigarettes daily.  Has been vaping THC for the past week.  Marland Kitchen Sexual activity: Yes  Lifestyle  . Physical activity:    Days per week: Not on file    Minutes per session: Not on file  . Stress: Not on file  Relationships  . Social connections:    Talks on phone: Not on file    Gets together: Not on file    Attends religious service: Not on file    Active member of club or organization: Not on file    Attends meetings of clubs or organizations: Not on file    Relationship status: Not on file  . Intimate partner violence:    Fear of current or ex partner: Not on file    Emotionally abused: Not on file    Physically abused: Not on file    Forced sexual activity: Not on file  Other Topics Concern  . Not on file  Social History Narrative   Lives in [place] with his wife and their children.  Does not routinely exercise but says he's 'always doing something.'  Enjoys cooking.    Family History  Problem Relation Age of Onset  . High blood pressure Mother        alive in her mid-50's  . Heart failure Mother   . Sleep apnea Father        alive in his mid-50's  . Heart attack Maternal Grandmother   . Diabetes Maternal Grandmother     Review of Systems: A 12-system review of systems was performed and was negative except as noted in the HPI.  --------------------------------------------------------------------------------------------------  Physical Exam: BP 106/78 (BP Location: Left Arm, Patient Position: Sitting, Cuff Size: Normal)   Pulse 75   Ht 5\' 7"  (1.702 m)   Wt 166 lb 12 oz (75.6 kg)   BMI 26.12 kg/m  Repeat BP 110/72  General: NAD. HEENT: No conjunctival pallor or scleral icterus. Moist mucous membranes.  OP clear. Neck: Supple without lymphadenopathy, thyromegaly, JVD, or HJR. Lungs: Normal work of breathing. Clear to auscultation bilaterally without  wheezes or crackles. Heart: Distant heart sounds.  Regular rate and rhythm without murmurs, rubs, or gallops. Non-displaced PMI. Abd: Bowel sounds present. Soft, NT/ND without hepatosplenomegaly Ext: No lower extremity edema. Radial, PT, and DP pulses are 2+ bilaterally.  Right radial arteriotomy well-healed. Skin: Warm and dry without rash.  EKG: Normal sinus rhythm with LVH and lateral T wave inversions.  Compared with prior tracing from 05/16/17, inferior T wave inversions are less pronounced.  Lab Results  Component Value Date   WBC 13.1 (H) 05/17/2017   HGB 12.2 (L) 05/17/2017   HCT 38.7 (L) 05/17/2017   MCV 90.5 05/17/2017   PLT 223 05/17/2017    Lab Results  Component Value Date  NA 138 05/18/2017   K 3.6 05/18/2017   CL 110 05/18/2017   CO2 22 05/18/2017   BUN 11 05/18/2017   CREATININE 1.06 05/18/2017   GLUCOSE 84 05/18/2017   ALT 33 05/16/2017    Lab Results  Component Value Date   CHOL 138 05/17/2017   HDL 49 05/17/2017   LDLCALC 59 05/17/2017   TRIG 148 05/17/2017   CHOLHDL 2.8 05/17/2017    --------------------------------------------------------------------------------------------------  ASSESSMENT AND PLAN: Coronary artery disease with recent late presenting/missed STEMI Dean Boyd overall feels better but still has occasional episodes of chest pain.  It is not exertional and could be related to his recent MI or alternative pathology, as he has a long history of chest and abdominal pain predating his recent MI.  I do not hear a rub on exam today.  Also, the pain is not pleuritic in nature.  We have agreed to add metoprolol succinate 12.5 mg daily for antianginal therapy.  I will continue 12 months of aspirin and clopidogrel.  Given myalgias and stiffness in the lower extremities as well as good LDL at the time of his presentation (59), we will decrease atorvastatin to 20 mg daily.  I will refer Dean Boyd to cardiac rehab.  I provided him with a prescription  for sublingual nitroglycerin to be used if his chest pain recurs.  If pain does not resolve promptly with nitroglycerin, he was instructed to call 911.  Polysubstance abuse I congratulated Dean Boyd on cutting back on smoking and alcohol consumption as well as stopping marijuana use.  I encouraged him to stop smoking and drinking altogether.  Follow-up: Return to clinic in 1 month.  Yvonne Kendall, MD 05/23/2017 12:07 PM

## 2017-05-23 NOTE — Patient Instructions (Signed)
Medication Instructions:  Your physician has recommended you make the following change in your medication:  1- START Metoprolol succinate 12.5 mg (0.5 tablet) by mouth once a day. 2- DECREASE Lipitor to 20 mg by mouth once a day. 3- Take Nitroglycerin as needed. Dissolve 0.4 mg (1 tablet) under the tongue every 5 minutes for a maximum of 3 doses for chest pain. If chest pain is not resolved after 3 doses then CALL 911 or go to nearest Emergency department. Make sure to sit down if you take Nitro, as it may cause a drop in your blood pressure.   Labwork: none  Testing/Procedures: none  Follow-Up: Your physician recommends that you schedule a follow-up appointment With Cardiac Rehab. If you have not received a call from them in the next week, then call them to schedule your session.  Your physician recommends that you schedule a follow-up appointment in: 1 MONTH WITH DR END OR APP.  If you need a refill on your cardiac medications before your next appointment, please call your pharmacy.   Nitroglycerin sublingual tablets What is this medicine? NITROGLYCERIN (nye troe GLI ser in) is a type of vasodilator. It relaxes blood vessels, increasing the blood and oxygen supply to your heart. This medicine is used to relieve chest pain caused by angina. It is also used to prevent chest pain before activities like climbing stairs, going outdoors in cold weather, or sexual activity. This medicine may be used for other purposes; ask your health care provider or pharmacist if you have questions. COMMON BRAND NAME(S): Nitroquick, Nitrostat, Nitrotab What should I tell my health care provider before I take this medicine? They need to know if you have any of these conditions: -anemia -head injury, recent stroke, or bleeding in the brain -liver disease -previous heart attack -an unusual or allergic reaction to nitroglycerin, other medicines, foods, dyes, or preservatives -pregnant or trying to get  pregnant -breast-feeding How should I use this medicine? Take this medicine by mouth as needed. At the first sign of an angina attack (chest pain or tightness) place one tablet under your tongue. You can also take this medicine 5 to 10 minutes before an event likely to produce chest pain. Follow the directions on the prescription label. Let the tablet dissolve under the tongue. Do not swallow whole. Replace the dose if you accidentally swallow it. It will help if your mouth is not dry. Saliva around the tablet will help it to dissolve more quickly. Do not eat or drink, smoke or chew tobacco while a tablet is dissolving. If you are not better within 5 minutes after taking ONE dose of nitroglycerin, call 9-1-1 immediately to seek emergency medical care. Do not take more than 3 nitroglycerin tablets over 15 minutes. If you take this medicine often to relieve symptoms of angina, your doctor or health care professional may provide you with different instructions to manage your symptoms. If symptoms do not go away after following these instructions, it is important to call 9-1-1 immediately. Do not take more than 3 nitroglycerin tablets over 15 minutes. Talk to your pediatrician regarding the use of this medicine in children. Special care may be needed. Overdosage: If you think you have taken too much of this medicine contact a poison control center or emergency room at once. NOTE: This medicine is only for you. Do not share this medicine with others. What if I miss a dose? This does not apply. This medicine is only used as needed. What may interact with  this medicine? Do not take this medicine with any of the following medications: -certain migraine medicines like ergotamine and dihydroergotamine (DHE) -medicines used to treat erectile dysfunction like sildenafil, tadalafil, and vardenafil -riociguat This medicine may also interact with the following medications: -alteplase -aspirin -heparin -medicines  for high blood pressure -medicines for mental depression -other medicines used to treat angina -phenothiazines like chlorpromazine, mesoridazine, prochlorperazine, thioridazine This list may not describe all possible interactions. Give your health care provider a list of all the medicines, herbs, non-prescription drugs, or dietary supplements you use. Also tell them if you smoke, drink alcohol, or use illegal drugs. Some items may interact with your medicine. What should I watch for while using this medicine? Tell your doctor or health care professional if you feel your medicine is no longer working. Keep this medicine with you at all times. Sit or lie down when you take your medicine to prevent falling if you feel dizzy or faint after using it. Try to remain calm. This will help you to feel better faster. If you feel dizzy, take several deep breaths and lie down with your feet propped up, or bend forward with your head resting between your knees. You may get drowsy or dizzy. Do not drive, use machinery, or do anything that needs mental alertness until you know how this drug affects you. Do not stand or sit up quickly, especially if you are an older patient. This reduces the risk of dizzy or fainting spells. Alcohol can make you more drowsy and dizzy. Avoid alcoholic drinks. Do not treat yourself for coughs, colds, or pain while you are taking this medicine without asking your doctor or health care professional for advice. Some ingredients may increase your blood pressure. What side effects may I notice from receiving this medicine? Side effects that you should report to your doctor or health care professional as soon as possible: -blurred vision -dry mouth -skin rash -sweating -the feeling of extreme pressure in the head -unusually weak or tired Side effects that usually do not require medical attention (report to your doctor or health care professional if they continue or are  bothersome): -flushing of the face or neck -headache -irregular heartbeat, palpitations -nausea, vomiting This list may not describe all possible side effects. Call your doctor for medical advice about side effects. You may report side effects to FDA at 1-800-FDA-1088. Where should I keep my medicine? Keep out of the reach of children. Store at room temperature between 20 and 25 degrees C (68 and 77 degrees F). Store in Retail buyeroriginal container. Protect from light and moisture. Keep tightly closed. Throw away any unused medicine after the expiration date. NOTE: This sheet is a summary. It may not cover all possible information. If you have questions about this medicine, talk to your doctor, pharmacist, or health care provider.  2018 Elsevier/Gold Standard (2012-11-28 17:57:36)

## 2017-06-01 ENCOUNTER — Ambulatory Visit: Payer: Self-pay | Admitting: Cardiovascular Disease

## 2017-06-19 ENCOUNTER — Telehealth: Payer: Self-pay | Admitting: Pharmacy Technician

## 2017-06-19 NOTE — Telephone Encounter (Signed)
Provided patient with new patient packet to obtain [facility] services.  Patient understands that [facility] must receive 2019 financial documentation in order to determine eligibility.  Eldor Conaway J. Monifah Freehling Care Manager [facility] 

## 2017-06-26 ENCOUNTER — Encounter: Payer: Self-pay | Admitting: Nurse Practitioner

## 2017-06-26 ENCOUNTER — Ambulatory Visit: Payer: Self-pay | Admitting: Nurse Practitioner

## 2017-06-26 NOTE — Progress Notes (Deleted)
Office Visit    Patient Name: Dean Boyd Date of Encounter: 06/26/2017  Primary Care Provider:  Patient, No Pcp Per Primary Cardiologist:  Yvonne Kendall, MD  Chief Complaint    37 year old male with a history of polysubstance abuse and recurrent nausea and vomiting, who was recently admitted with late presenting inferolateral MI and finding of an occluded left circumflex.  Past Medical History    Past Medical History:  Diagnosis Date  . Alcohol abuse   . Coronary artery disease    a. 05/2017 Late presenting STEMI/Cath: LM nl, LAD nl, D1/2/3 nl, RI min irregs, LCX 114m, distal vessel fills via R->L collats, OM1 min irregs, RCA/RPDA/RPAV/RPL1/2 min irregs.  . Ischemic cardiomyopathy    a. 05/2017 Echo: EF 50-55%, mild asymmetric posterior hypertrophy. Sev basal-midinferolateral HK. Mild MR. Nl RV fxn. Nl RV fxn.  . Marijuana abuse   . Nausea and vomiting   . Tobacco abuse    Past Surgical History:  Procedure Laterality Date  . LEFT HEART CATH AND CORONARY ANGIOGRAPHY N/A 05/17/2017   Procedure: LEFT HEART CATH AND CORONARY ANGIOGRAPHY;  Surgeon: Iran Ouch, MD;  Location: [facility] INVASIVE CV LAB;  Service: Cardiovascular;  Laterality: N/A;    Allergies  No Known Allergies  History of Present Illness    37 year old male with the above past medical history including alcohol, marijuana, and tobacco abuse.  He also has a relatively long history of intermittent nausea and vomiting with multiple ER evaluations   And concern for cannabinoid hyperemesis syndrome.  He was evaluated in the ER in late March secondary to intermittent nausea and vomiting associated with chest and abdominal pain.  Initial ECG showed inferolateral ST segment elevation and LVH.  Troponin was mildly elevated at 0.22.  CT of the chest, abdomen, and pelvis was negative for dissection but there was notation of subtle hypoattenuation involving the left ventricular free wall, which was concerning for  ischemia.  Admission was advised but the patient had left AMA at that point.  He was contacted by the emergency room following CT findings and advised to return however he did not return for several days despite ongoing symptoms of nausea, vomiting, and chest pain.  Upon return, his troponin was found to be 12.21 and he was admitted.  Catheterization showed an occluded left circumflex with otherwise nonobstructive disease.  EF was 50 to 55%.  He was placed on medical therapy and counseled on the importance of polysubstance cessation.  Home Medications    Prior to Admission medications   Medication Sig Start Date End Date Taking? Authorizing Provider  aspirin 81 MG chewable tablet Chew 1 tablet (81 mg total) by mouth before cath procedure. 05/19/17   Altamese Dilling, MD  atorvastatin (LIPITOR) 20 MG tablet Take 1 tablet (20 mg total) by mouth daily. 05/23/17 08/21/17  End, Cristal Deer, MD  clopidogrel (PLAVIX) 75 MG tablet Take 1 tablet (75 mg total) by mouth daily with breakfast. 05/18/17   Altamese Dilling, MD  metoprolol succinate (TOPROL XL) 25 MG 24 hr tablet Take 0.5 tablets (12.5 mg total) by mouth daily. 05/23/17   End, Cristal Deer, MD  nitroGLYCERIN (NITROSTAT) 0.4 MG SL tablet Place 1 tablet (0.4 mg total) under the tongue every 5 (five) minutes as needed for chest pain. Maximum of 3 doses. 05/23/17 08/21/17  Yvonne Kendall, MD    Review of Systems    ***.  All other systems reviewed and are otherwise negative except as noted above.  Physical Exam  VS:  There were no vitals taken for this visit. , BMI There is no height or weight on file to calculate BMI. GEN: Well nourished, well developed, in no acute distress. HEENT: normal. Neck: Supple, no JVD, carotid bruits, or masses. Cardiac: RRR, no murmurs, rubs, or gallops. No clubbing, cyanosis, edema.  Radials/DP/PT 2+ and equal bilaterally.  Respiratory:  Respirations regular and unlabored, clear to auscultation bilaterally. GI:  Soft, nontender, nondistended, BS + x 4. MS: no deformity or atrophy. Skin: warm and dry, no rash. Neuro:  Strength and sensation are intact. Psych: Normal affect.  Accessory Clinical Findings    ECG - ***  Assessment & Plan    1.  ***   Nicolasa Ducking, NP 06/26/2017, 8:38 AM

## 2017-06-27 ENCOUNTER — Encounter: Payer: Self-pay | Admitting: Nurse Practitioner

## 2017-08-20 ENCOUNTER — Ambulatory Visit: Payer: Self-pay | Admitting: Nurse Practitioner

## 2017-08-21 ENCOUNTER — Encounter: Payer: Self-pay | Admitting: Nurse Practitioner

## 2017-08-29 ENCOUNTER — Other Ambulatory Visit: Payer: Self-pay | Admitting: *Deleted

## 2017-08-29 MED ORDER — CLOPIDOGREL BISULFATE 75 MG PO TABS: 75.0000 mg | ORAL_TABLET | Freq: Every day | ORAL | 1 refills | Status: DC

## 2020-04-28 ENCOUNTER — Other Ambulatory Visit: Payer: Self-pay

## 2020-04-28 ENCOUNTER — Emergency Department: Payer: Medicaid Other

## 2020-04-28 ENCOUNTER — Observation Stay
Admission: EM | Admit: 2020-04-28 | Discharge: 2020-04-28 | Disposition: A | Discharge status: Home / Self Care | Payer: Medicaid Other | Attending: Family Medicine | Admitting: Family Medicine

## 2020-04-28 DIAGNOSIS — R079 Chest pain, unspecified: Secondary | ICD-10-CM

## 2020-04-28 DIAGNOSIS — I249 Acute ischemic heart disease, unspecified: Secondary | ICD-10-CM

## 2020-04-28 DIAGNOSIS — I25110 Atherosclerotic heart disease of native coronary artery with unstable angina pectoris: Secondary | ICD-10-CM

## 2020-04-28 DIAGNOSIS — Z7982 Long term (current) use of aspirin: Secondary | ICD-10-CM | POA: Insufficient documentation

## 2020-04-28 DIAGNOSIS — Z7902 Long term (current) use of antithrombotics/antiplatelets: Secondary | ICD-10-CM | POA: Insufficient documentation

## 2020-04-28 DIAGNOSIS — Y900 Blood alcohol level of less than 20 mg/100 ml: Secondary | ICD-10-CM | POA: Insufficient documentation

## 2020-04-28 DIAGNOSIS — Z20822 Contact with and (suspected) exposure to covid-19: Secondary | ICD-10-CM | POA: Insufficient documentation

## 2020-04-28 DIAGNOSIS — I214 Non-ST elevation (NSTEMI) myocardial infarction: Principal | ICD-10-CM | POA: Insufficient documentation

## 2020-04-28 DIAGNOSIS — E785 Hyperlipidemia, unspecified: Secondary | ICD-10-CM

## 2020-04-28 DIAGNOSIS — F17210 Nicotine dependence, cigarettes, uncomplicated: Secondary | ICD-10-CM | POA: Insufficient documentation

## 2020-04-28 DIAGNOSIS — F1910 Other psychoactive substance abuse, uncomplicated: Secondary | ICD-10-CM

## 2020-04-28 DIAGNOSIS — Z79899 Other long term (current) drug therapy: Secondary | ICD-10-CM | POA: Insufficient documentation

## 2020-04-28 LAB — CBC
HCT: 40.4 % (ref 39.0–52.0)
Hemoglobin: 12.8 g/dL — ABNORMAL LOW (ref 13.0–17.0)
MCH: 28.5 pg (ref 26.0–34.0)
MCHC: 31.7 g/dL (ref 30.0–36.0)
MCV: 90 fL (ref 80.0–100.0)
Platelets: 208 10*3/uL (ref 150–400)
RBC: 4.49 MIL/uL (ref 4.22–5.81)
RDW: 12.7 % (ref 11.5–15.5)
WBC: 9.6 10*3/uL (ref 4.0–10.5)
nRBC: 0 % (ref 0.0–0.2)

## 2020-04-28 LAB — CBC WITH DIFFERENTIAL/PLATELET
Abs Immature Granulocytes: 0.04 10*3/uL (ref 0.00–0.07)
Basophils Absolute: 0.1 10*3/uL (ref 0.0–0.1)
Basophils Relative: 1 %
Eosinophils Absolute: 0.2 10*3/uL (ref 0.0–0.5)
Eosinophils Relative: 1 %
HCT: 42 % (ref 39.0–52.0)
Hemoglobin: 13.4 g/dL (ref 13.0–17.0)
Immature Granulocytes: 0 %
Lymphocytes Relative: 11 %
Lymphs Abs: 1.3 10*3/uL (ref 0.7–4.0)
MCH: 28.6 pg (ref 26.0–34.0)
MCHC: 31.9 g/dL (ref 30.0–36.0)
MCV: 89.6 fL (ref 80.0–100.0)
Monocytes Absolute: 1.2 10*3/uL — ABNORMAL HIGH (ref 0.1–1.0)
Monocytes Relative: 11 %
Neutro Abs: 8.9 10*3/uL — ABNORMAL HIGH (ref 1.7–7.7)
Neutrophils Relative %: 76 %
Platelets: 215 10*3/uL (ref 150–400)
RBC: 4.69 MIL/uL (ref 4.22–5.81)
RDW: 12.9 % (ref 11.5–15.5)
WBC: 11.7 10*3/uL — ABNORMAL HIGH (ref 4.0–10.5)
nRBC: 0 % (ref 0.0–0.2)

## 2020-04-28 LAB — BASIC METABOLIC PANEL
Anion gap: 8 (ref 5–15)
BUN: 9 mg/dL (ref 6–20)
CO2: 22 mmol/L (ref 22–32)
Calcium: 8.8 mg/dL — ABNORMAL LOW (ref 8.9–10.3)
Chloride: 108 mmol/L (ref 98–111)
Creatinine, Ser: 1.08 mg/dL (ref 0.61–1.24)
GFR, Estimated: >60 mL/min (ref >60)
Glucose, Bld: 91 mg/dL (ref 70–99)
Potassium: 4.2 mmol/L (ref 3.5–5.1)
Sodium: 138 mmol/L (ref 135–145)

## 2020-04-28 LAB — COMPREHENSIVE METABOLIC PANEL
ALT: 15 U/L (ref 0–44)
AST: 23 U/L (ref 15–41)
Albumin: 4.1 g/dL (ref 3.5–5.0)
Alkaline Phosphatase: 50 U/L (ref 38–126)
Anion gap: 9 (ref 5–15)
BUN: 9 mg/dL (ref 6–20)
CO2: 20 mmol/L — ABNORMAL LOW (ref 22–32)
Calcium: 9 mg/dL (ref 8.9–10.3)
Chloride: 107 mmol/L (ref 98–111)
Creatinine, Ser: 1.1 mg/dL (ref 0.61–1.24)
GFR, Estimated: >60 mL/min (ref >60)
Glucose, Bld: 96 mg/dL (ref 70–99)
Potassium: 4.2 mmol/L (ref 3.5–5.1)
Sodium: 136 mmol/L (ref 135–145)
Total Bilirubin: 0.9 mg/dL (ref 0.3–1.2)
Total Protein: 7.5 g/dL (ref 6.5–8.1)

## 2020-04-28 LAB — APTT: aPTT: 29 seconds (ref 24–36)

## 2020-04-28 LAB — RESP PANEL BY RT-PCR (FLU A&B, COVID) ARPGX2
Influenza A by PCR: NEGATIVE
Influenza B by PCR: NEGATIVE
SARS Coronavirus 2 by RT PCR: NEGATIVE

## 2020-04-28 LAB — LIPID PANEL
Cholesterol: 190 mg/dL (ref 0–200)
Cholesterol: 159 mg/dL (ref 0–200)
HDL: 57 mg/dL (ref >40)
HDL: 54 mg/dL (ref >40)
LDL Cholesterol: 80 mg/dL (ref 0–99)
LDL Cholesterol: 93 mg/dL (ref 0–99)
Total CHOL/HDL Ratio: 3.3 RATIO
Total CHOL/HDL Ratio: 2.9 RATIO
Triglycerides: 267 mg/dL — ABNORMAL HIGH (ref <150)
Triglycerides: 62 mg/dL (ref <150)
VLDL: 53 mg/dL — ABNORMAL HIGH (ref 0–40)
VLDL: 12 mg/dL (ref 0–40)

## 2020-04-28 LAB — ETHANOL: Alcohol, Ethyl (B): <10 mg/dL (ref <10)

## 2020-04-28 LAB — TROPONIN I (HIGH SENSITIVITY)
Troponin I (High Sensitivity): 6 ng/L (ref <18)
Troponin I (High Sensitivity): 6 ng/L (ref <18)

## 2020-04-28 LAB — HIV ANTIBODY (ROUTINE TESTING W REFLEX): HIV Screen 4th Generation wRfx: NONREACTIVE

## 2020-04-28 LAB — HEMOGLOBIN A1C
Hgb A1c MFr Bld: 5 % (ref 4.8–5.6)
Mean Plasma Glucose: 96.8 mg/dL

## 2020-04-28 LAB — PROTIME-INR
INR: 1 (ref 0.8–1.2)
Prothrombin Time: 12.7 seconds (ref 11.4–15.2)

## 2020-04-28 MED ORDER — ONDANSETRON HCL 4 MG/2ML IJ SOLN: 4.0000 mg | Freq: Four times a day (QID) | INTRAMUSCULAR | Status: DC | PRN

## 2020-04-28 MED ORDER — ACETAMINOPHEN 325 MG PO TABS: 650.0000 mg | ORAL_TABLET | ORAL | Status: DC | PRN

## 2020-04-28 MED ORDER — ASPIRIN 81 MG PO CHEW: 324.0000 mg | CHEWABLE_TABLET | Freq: Once | ORAL | Status: DC

## 2020-04-28 MED ORDER — ALPRAZOLAM 0.5 MG PO TABS: 0.2500 mg | ORAL_TABLET | Freq: Two times a day (BID) | ORAL | Status: DC | PRN

## 2020-04-28 MED ORDER — ASPIRIN 81 MG PO CHEW: 81.0000 mg | CHEWABLE_TABLET | ORAL | Status: DC

## 2020-04-28 MED ORDER — NITROGLYCERIN 0.4 MG SL SUBL: 0.4000 mg | SUBLINGUAL_TABLET | SUBLINGUAL | Status: DC | PRN

## 2020-04-28 MED ORDER — SODIUM CHLORIDE 0.9 % IV SOLN: INTRAVENOUS | Status: DC

## 2020-04-28 MED ORDER — HEPARIN SODIUM (PORCINE) 5000 UNIT/ML IJ SOLN: 4000.0000 [IU] | Freq: Once | INTRAMUSCULAR | Status: DC

## 2020-04-28 MED ORDER — CLOPIDOGREL BISULFATE 75 MG PO TABS: 75.0000 mg | ORAL_TABLET | Freq: Every day | ORAL | Status: DC

## 2020-04-28 MED ORDER — HEPARIN SODIUM (PORCINE) 5000 UNIT/ML IJ SOLN
4000.0000 [IU] | Freq: Once | INTRAMUSCULAR | Status: AC
  Administered 2020-04-28: 4000 [IU] via INTRAVENOUS

## 2020-04-28 MED ORDER — ASPIRIN 300 MG RE SUPP: 300.0000 mg | RECTAL | Status: DC

## 2020-04-28 MED ORDER — ASPIRIN 81 MG PO CHEW
324.0000 mg | CHEWABLE_TABLET | Freq: Once | ORAL | Status: AC
  Administered 2020-04-28: 324 mg via ORAL

## 2020-04-28 MED ORDER — ZOLPIDEM TARTRATE 5 MG PO TABS: 5.0000 mg | ORAL_TABLET | Freq: Every evening | ORAL | Status: DC | PRN

## 2020-04-28 MED ORDER — ATORVASTATIN CALCIUM 20 MG PO TABS: 20.0000 mg | ORAL_TABLET | Freq: Every day | ORAL | Status: DC

## 2020-04-28 MED ORDER — METOPROLOL SUCCINATE ER 25 MG PO TB24: 12.5000 mg | ORAL_TABLET | Freq: Every day | ORAL | Status: DC

## 2020-04-28 MED ORDER — HEPARIN (PORCINE) 25000 UT/250ML-% IV SOLN
900.0000 [IU]/h | INTRAVENOUS | Status: DC
  Administered 2020-04-28: 900 [IU]/h via INTRAVENOUS
  Filled 2020-04-28: qty 250

## 2020-04-28 MED ORDER — ATORVASTATIN CALCIUM 20 MG PO TABS
80.0000 mg | ORAL_TABLET | Freq: Every day | ORAL | Status: DC
  Administered 2020-04-28: 80 mg via ORAL
  Filled 2020-04-28: qty 4

## 2020-04-28 MED ORDER — ASPIRIN EC 81 MG PO TBEC: 81.0000 mg | DELAYED_RELEASE_TABLET | Freq: Every day | ORAL | Status: DC

## 2020-04-28 MED ORDER — HEPARIN (PORCINE) 25000 UT/250ML-% IV SOLN: 14.0000 [IU]/kg/h | INTRAVENOUS | Status: DC

## 2020-04-28 MED ORDER — ASPIRIN 81 MG PO CHEW: 324.0000 mg | CHEWABLE_TABLET | ORAL | Status: DC

## 2020-04-28 NOTE — ED Triage Notes (Signed)
Pt states he was woken up out of his sleep with shortness of breath, pt states prior to this he has had a productive cough for the past few days. Pt denies any fevers at home. Pt states he has hx of MI 1-2 yrs ago.

## 2020-04-28 NOTE — Progress Notes (Signed)
ANTICOAGULATION CONSULT NOTE - Initial Consult  Pharmacy Consult for Heparin  Indication: chest pain/ACS  No Known Allergies  Patient Measurements: Height: 5\' 7"  (170.2 cm) Weight: 73.9 kg (163 lb) IBW/kg (Calculated) : 66.1 Heparin Dosing Weight: 73.9 kg   Vital Signs: Temp: 98.9 F (37.2 C) (03/16 0155) Temp Source: Oral (03/16 0155) BP: 129/90 (03/16 0212) Pulse Rate: 95 (03/16 0155)  Labs: No results for input(s): HGB, HCT, PLT, APTT, LABPROT, INR, HEPARINUNFRC, HEPRLOWMOCWT, CREATININE, CKTOTAL, CKMB, TROPONINIHS in the last 72 hours.  CrCl cannot be calculated (Patient's most recent lab result is older than the maximum 21 days allowed.).   Medical History: Past Medical History:  Diagnosis Date  . Alcohol abuse   . Coronary artery disease    a. 05/2017 Late presenting STEMI/Cath: LM nl, LAD nl, D1/2/3 nl, RI min irregs, LCX 170m, distal vessel fills via R->L collats, OM1 min irregs, RCA/RPDA/RPAV/RPL1/2 min irregs.  . Ischemic cardiomyopathy    a. 05/2017 Echo: EF 50-55%, mild asymmetric posterior hypertrophy. Sev basal-midinferolateral HK. Mild MR. Nl RV fxn. Nl RV fxn.  . Marijuana abuse   . Nausea and vomiting   . Tobacco abuse     Medications:  (Not in a hospital admission)   Assessment: Pharmacy consulted to dose heparin in this 40 year old male admitted with ACS/NSTEMI.   No prior anticoag noted. CrCl = ?   Goal of Therapy:  Heparin level 0.3-0.7 units/ml Monitor platelets by anticoagulation protocol: Yes   Plan:  Give 4000 units bolus x 1 Start heparin infusion at 900 units/hr Check anti-Xa level in 6 hours and daily while on heparin Continue to monitor H&H and platelets  Robbins,Jason D 04/28/2020,2:15 AM

## 2020-04-28 NOTE — ED Notes (Signed)
CODE STEMI CALLED TO [facility] (TARA)

## 2020-04-28 NOTE — ED Notes (Signed)
Per Dr. Dolores Frame, STEMI alert cancelled at this time by cardiology following repeat EKG

## 2020-04-28 NOTE — Discharge Summary (Signed)
Leaving AGAINST MEDICAL ADVICE note:  Shortly after the patient was admitted, he asked to eat and when advised that he needs to be n.p.o. for potential cardiac stress test or catheterization in a.m. given his unstable angina and dynamic EKG changes he became angry.  When he was offered clear liquids for 1 time and still insisted on having his IV removed.  He understands the risks involved including death for him acute myocardial infarction.  I offered to further discuss with him the risks involved and that we can give him a small meal but he needs to stay for further observation, he even refused to talk to me and insisted on leaving AGAINST MEDICAL ADVICE.  For admission diagnosis, history of present illness, past medical history, past surgical history, allergies, medications, physical examination, labs and radiographic studies please refer to dictated admission H&P.  The patient remained stable during his stay in the emergency room and was pain-free with improvement of his EKG.  His fasting lipids revealed a total cholesterol of 190, HDL of 57, LDL of 80 and triglycerides of 267 with VLDL of 53.  Discharge diagnosis:  1.  Acute coronary syndrome likely unstable angina. 2.  Coronary artery disease with unstable angina. 3.  Dyslipidemia. 4.  Polysubstance abuse including alcohol, tobacco abuse and marijuana.  Disposition: The patient left AGAINST MEDICAL ADVICE.

## 2020-04-28 NOTE — Significant Event (Signed)
[facility] STEMI Evaluation Note  Date: 04/28/20  Time: 2:15 AM  I was contacted by Dr. Dolores Frame regarding [name]'s EKG and concern for STEMI.  He has a history of inferolateral STEMI in 2019, at which time cath showed occluded LCx.  Intervention was not attempted, as patient initially presented 3 days earlier with STEMI but left AMA and then returned a few days later intermittent chest pain.  He followed-up once with me in the office but did not return.  Per Dr. Dolores Frame, he has not been taking any medications for >1 year.  [name] was reportedly awakened from sleep with severe chest pain.  Initial EKG in the ED at 1:56 AM shows sinus tachycardia with 3 mm ST elevation isolated to V3 as well as inferolateral T wave inversions  Repeat EKG at 2:08 AM shows sinus tachycardia with complete resolution of ST elevation in V3 and no other significant ST segment abnormality.  Given that initial EKG does not show significant ST elevation in at least 2 contiguous leads and repeat EKG 11 minutes later shows absence of any ST elevation, I do no believe this represents a STEMI.  Additionally, given history of poor compliance with follow-up and medications, I would favor further workup in the ED and medical management before proceeding with cardiac catheterization.  Serial EKG's should be obtained to exclude recurrent ST elevation.  Please contact us if the patient has refractory chest pain or dynamic EKG changes concerning for evolving STEMI occur.  Findings and recommendations were discussed with Dr. Dolores Frame, who is in agreements with this plan.  Yvonne Kendall, MD [facility]

## 2020-04-28 NOTE — H&P (Signed)
[place]   PATIENT NAME: Dean Boyd    MR#:  [id number]  DATE OF BIRTH:  [birth year]  DATE OF ADMISSION:  04/28/2020  PRIMARY CARE PHYSICIAN: Patient, No Pcp Per   Patient is coming from: Home  REQUESTING/REFERRING PHYSICIAN: Chiquita Loth, MD CHIEF COMPLAINT:   Chief Complaint  Patient presents with  . Shortness of Breath  . Chest Pain    HISTORY OF PRESENT ILLNESS:  Dean Boyd is a 40 y.o. African-American male with medical history significant for coronary artery disease status post STEMI, status post PCI and stents, ischemic cardiomyopathy, tobacco and marijuana as well as alcohol abuse, presented to the emergency room with acute onset of midsternal chest pain felt this pressure and tightness with radiation to his left shoulder.  He graded it 9/10 in severity and it happened while he was sitting.  He admitted to associated diaphoresis and nausea without vomiting as well as dyspnea and palpitations..  No recent travels or surgeries or leg pain or edema.  No bleeding diathesis.  He continues to smoke cigarettes and marijuana every now and then.  ED Course: When he came to the ER respiratory rate was 22 with otherwise normal vital signs.  Labs revealed unremarkable CMP.  High-sensitivity troponin I was 6 twice.  CBC showed no leukocytosis of 11.7 with neutrophilia.  Influenza antigens and COVID-19 PCR came back negative.  Alcohol level was less than 10. EKG as reviewed by me : Initially showed sinus tachycardia with a rate of 102 with biatrial enlargement and ST segment elevation of about 2-1/2 mm in V3 and 1 mm in V1, V2 and V4 with T wave inversion inferiorly and in V5 and V6.  EKG was concerning for STEMI and was reviewed by Dr. Okey Dupre.  The patient received 4 of aspirin.  Within few minutes repeat EKG revealed sinus tachycardia with rate 106 with biatrial enlargement with significant improvement of ST segment elevation that was down to half a millimeter in V3 with  persistent T wave inversion in inferiorly and in V5 and V6.  Third EKG revealed sinus rhythm with a rate of 55 without significant changes otherwise. Imaging: Chest x-ray showed no acute cardiopulmonary disease.  The patient was given 4 baby aspirin as mentioned above, IV heparin bolus and drip was ordered sublingual nitroglycerin.  The patient will be admitted to a progressive unit bed for further evaluation and management. PAST MEDICAL HISTORY:   Past Medical History:  Diagnosis Date  . Alcohol abuse   . Coronary artery disease    a. 05/2017 Late presenting STEMI/Cath: LM nl, LAD nl, D1/2/3 nl, RI min irregs, LCX 149m, distal vessel fills via R->L collats, OM1 min irregs, RCA/RPDA/RPAV/RPL1/2 min irregs.  . Ischemic cardiomyopathy    a. 05/2017 Echo: EF 50-55%, mild asymmetric posterior hypertrophy. Sev basal-midinferolateral HK. Mild MR. Nl RV fxn. Nl RV fxn.  . Marijuana abuse   . Nausea and vomiting   . Tobacco abuse     PAST SURGICAL HISTORY:   Past Surgical History:  Procedure Laterality Date  . LEFT HEART CATH AND CORONARY ANGIOGRAPHY N/A 05/17/2017   Procedure: LEFT HEART CATH AND CORONARY ANGIOGRAPHY;  Surgeon: Iran Ouch, MD;  Location: [facility] INVASIVE CV LAB;  Service: Cardiovascular;  Laterality: N/A;    SOCIAL HISTORY:   Social History   Tobacco Use  . Smoking status: Current Every Day Smoker    Packs/day: 0.25    Years: 15.00    Pack years: 3.75  Types: Cigarettes  . Smokeless tobacco: Never Used  Substance Use Topics  . Alcohol use: Yes    Comment: 50-60oz/beer per day (~ 5 bottles)    FAMILY HISTORY:   Family History  Problem Relation Age of Onset  . High blood pressure Mother        alive in her mid-50's  . Heart failure Mother   . Sleep apnea Father        alive in his mid-50's  . Heart attack Maternal Grandmother   . Diabetes Maternal Grandmother     DRUG ALLERGIES:  No Known Allergies  REVIEW OF SYSTEMS:   ROS As per history of  present illness. All pertinent systems were reviewed above. Constitutional, HEENT, cardiovascular, respiratory, GI, GU, musculoskeletal, neuro, psychiatric, endocrine, integumentary and hematologic systems were reviewed and are otherwise negative/unremarkable except for positive findings mentioned above in the HPI.   MEDICATIONS AT HOME:   Prior to Admission medications   Medication Sig Start Date End Date Taking? Authorizing Provider  aspirin 81 MG chewable tablet Chew 1 tablet (81 mg total) by mouth before cath procedure. 05/19/17   Altamese Dilling, MD  atorvastatin (LIPITOR) 20 MG tablet Take 1 tablet (20 mg total) by mouth daily. 05/23/17 08/21/17  End, Cristal Deer, MD  clopidogrel (PLAVIX) 75 MG tablet Take 1 tablet (75 mg total) by mouth daily with breakfast. 08/29/17   End, Cristal Deer, MD  metoprolol succinate (TOPROL XL) 25 MG 24 hr tablet Take 0.5 tablets (12.5 mg total) by mouth daily. 05/23/17   End, Cristal Deer, MD  nitroGLYCERIN (NITROSTAT) 0.4 MG SL tablet Place 1 tablet (0.4 mg total) under the tongue every 5 (five) minutes as needed for chest pain. Maximum of 3 doses. 05/23/17 08/21/17  End, Cristal Deer, MD      VITAL SIGNS:  Blood pressure 128/78, pulse 80, temperature 98.9 F (37.2 C), temperature source Oral, resp. rate 10, height 5\' 7"  (1.702 m), weight 73.9 kg, SpO2 96 %.  PHYSICAL EXAMINATION:  Physical Exam  GENERAL:  40 y.o.-year-old African-American male patient lying in the bed with no acute distress.  EYES: Pupils equal, round, reactive to light and accommodation. No scleral icterus. Extraocular muscles intact.  HEENT: Head atraumatic, normocephalic. Oropharynx and nasopharynx clear.  NECK:  Supple, no jugular venous distention. No thyroid enlargement, no tenderness.  LUNGS: Normal breath sounds bilaterally, no wheezing, rales,rhonchi or crepitation. No use of accessory muscles of respiration.  CARDIOVASCULAR: Regular rate and rhythm, S1, S2 normal. No murmurs,  rubs, or gallops.  ABDOMEN: Soft, nondistended, with mild epigastric tenderness without rebound tenderness guarding or rigidity.. Bowel sounds present. No organomegaly or mass.  EXTREMITIES: No pedal edema, cyanosis, or clubbing.  NEUROLOGIC: Cranial nerves II through XII are intact. Muscle strength 5/5 in all extremities. Sensation intact. Gait not checked.  PSYCHIATRIC: The patient is alert and oriented x 3.  Normal affect and good eye contact. SKIN: No obvious rash, lesion, or ulcer.   LABORATORY PANEL:   CBC Recent Labs  Lab 04/28/20 0156  WBC 11.7*  HGB 13.4  HCT 42.0  PLT 215   ------------------------------------------------------------------------------------------------------------------  Chemistries  Recent Labs  Lab 04/28/20 0156  NA 136  K 4.2  CL 107  CO2 20*  GLUCOSE 96  BUN 9  CREATININE 1.10  CALCIUM 9.0  AST 23  ALT 15  ALKPHOS 50  BILITOT 0.9   ------------------------------------------------------------------------------------------------------------------  Cardiac Enzymes No results for input(s): TROPONINI in the last 168 hours. ------------------------------------------------------------------------------------------------------------------  RADIOLOGY:  DG Chest 1 View  Result Date: 04/28/2020 CLINICAL DATA:  Short of breath, productive cough EXAM: CHEST  1 VIEW COMPARISON:  05/16/2017 FINDINGS: Single frontal view of the chest demonstrates an unremarkable cardiac silhouette. External defibrillator pads overlie the cardiac apex. No airspace disease, effusion, or pneumothorax. No acute bony abnormalities. IMPRESSION: 1. No acute intrathoracic process. Electronically Signed   By: Sharlet Salina M.D.   On: 04/28/2020 02:14      IMPRESSION AND PLAN:  Active Problems:   Chest pain  1.  Chest pain concerning for unstable angina/acute coronary syndrome with dynamic EKG changes, with history of coronary artery disease status post STEMI and PCI with  stent. -The patient will be admitted to a progressive unit bed. -He will be placed on continued aspirin and Plavix with -We will place him on high-dose statin and continue beta-blocker therapy with Toprol-XL. -We will continue him on IV heparin drip. -Cardiology consult will be obtained.  2.  Dyslipidemia. -We will continue statin therapy.  3.  Polysubstance abuse including marijuana, tobacco and alcohol. -He was counseled for cessation and will receive further counseling here.  DVT prophylaxis: The patient will be on IV heparin. Code Status: full code. Family Communication:  The plan of care was discussed in details with the patient (and family). I answered all questions. The patient agreed to proceed with the above mentioned plan. Further management will depend upon hospital course. Disposition Plan: Back to previous home environment Consults called: Cardiology consult. All the records are reviewed and case discussed with ED provider.  Status is: Observation  The patient remains OBS appropriate and will d/c before 2 midnights.  Dispo: The patient is from: Home              Anticipated d/c is to: Home              Patient currently is not medically stable to d/c.   Difficult to place patient No   TOTAL TIME TAKING CARE OF THIS PATIENT: 55 minutes.    Hannah Beat M.D on 04/28/2020 at 3:29 AM  [facility]   From 7 PM-7 AM, contact night-coverage [email or web address]  CC: Primary care physician; Patient, No Pcp Per

## 2020-04-28 NOTE — ED Provider Notes (Incomplete)
[facility] Emergency Department Provider Note   ____________________________________________   Event Date/Time   First MD Initiated Contact with Patient 04/28/20 0200     (approximate)  I have reviewed the triage vital signs and the nursing notes.   HISTORY  Chief Complaint Shortness of Breath and Chest Pain    HPI Dean Boyd is a 40 y.o. male who presents to the ED from home with a chief complaint of chest pain.  Patient reports he was awoken out of his sleep approximately 8 PM with chest pain and shortness of breath.  He was unable to find a ride to the hospital until now.  Endorses productive cough for the past few days.  History of STEMI in April 2019 with occluded left circumflex which was medically managed secondary to late presentation.  At that time substance abuse was contributory.  Patient denies current substance use.  Admits he has not taken any of his medications in years.  Denies recent fever, abdominal pain, nausea, vomiting or dizziness.     Past Medical History:  Diagnosis Date  . Alcohol abuse   . Coronary artery disease    a. 05/2017 Late presenting STEMI/Cath: LM nl, LAD nl, D1/2/3 nl, RI min irregs, LCX 177m, distal vessel fills via R->L collats, OM1 min irregs, RCA/RPDA/RPAV/RPL1/2 min irregs.  . Ischemic cardiomyopathy    a. 05/2017 Echo: EF 50-55%, mild asymmetric posterior hypertrophy. Sev basal-midinferolateral HK. Mild MR. Nl RV fxn. Nl RV fxn.  . Marijuana abuse   . Nausea and vomiting   . Tobacco abuse     Patient Active Problem List   Diagnosis Date Noted  . ST elevation myocardial infarction (STEMI) (HCC) 05/23/2017  . Polysubstance abuse (HCC) 05/23/2017  . Non-ST elevation myocardial infarction (NSTEMI) (HCC)   . Chest pain 05/16/2017  . Intractable vomiting with nausea 05/12/2016    Past Surgical History:  Procedure Laterality Date  . LEFT HEART CATH AND CORONARY ANGIOGRAPHY N/A 05/17/2017   Procedure: LEFT  HEART CATH AND CORONARY ANGIOGRAPHY;  Surgeon: Iran Ouch, MD;  Location: [facility] INVASIVE CV LAB;  Service: Cardiovascular;  Laterality: N/A;    Prior to Admission medications   Medication Sig Start Date End Date Taking? Authorizing Provider  aspirin 81 MG chewable tablet Chew 1 tablet (81 mg total) by mouth before cath procedure. 05/19/17   Altamese Dilling, MD  atorvastatin (LIPITOR) 20 MG tablet Take 1 tablet (20 mg total) by mouth daily. 05/23/17 08/21/17  End, Cristal Deer, MD  clopidogrel (PLAVIX) 75 MG tablet Take 1 tablet (75 mg total) by mouth daily with breakfast. 08/29/17   End, Cristal Deer, MD  metoprolol succinate (TOPROL XL) 25 MG 24 hr tablet Take 0.5 tablets (12.5 mg total) by mouth daily. 05/23/17   End, Cristal Deer, MD  nitroGLYCERIN (NITROSTAT) 0.4 MG SL tablet Place 1 tablet (0.4 mg total) under the tongue every 5 (five) minutes as needed for chest pain. Maximum of 3 doses. 05/23/17 08/21/17  End, Cristal Deer, MD    Allergies Patient has no known allergies.  Family History  Problem Relation Age of Onset  . High blood pressure Mother        alive in her mid-50's  . Heart failure Mother   . Sleep apnea Father        alive in his mid-50's  . Heart attack Maternal Grandmother   . Diabetes Maternal Grandmother     Social History Social History   Tobacco Use  . Smoking status: Current Every Day  Smoker    Packs/day: 0.25    Years: 15.00    Pack years: 3.75    Types: Cigarettes  . Smokeless tobacco: Never Used  Vaping Use  . Vaping Use: Never used  Substance Use Topics  . Alcohol use: Yes    Comment: 50-60oz/beer per day (~ 5 bottles)  . Drug use: Yes    Types: Marijuana    Comment: Uses multiple mj cigarettes daily.  Has been vaping THC for the past week.    Review of Systems  Constitutional: No fever/chills Eyes: No visual changes. ENT: No sore throat. Cardiovascular: Positive for chest pain. Respiratory: Positive for shortness of  breath. Gastrointestinal: No abdominal pain.  No nausea, no vomiting.  No diarrhea.  No constipation. Genitourinary: Negative for dysuria. Musculoskeletal: Negative for back pain. Skin: Negative for rash. Neurological: Negative for headaches, focal weakness or numbness.   ____________________________________________   PHYSICAL EXAM:  VITAL SIGNS: ED Triage Vitals  Enc Vitals Group     BP --      Pulse Rate 04/28/20 0155 95     Resp 04/28/20 0155 (!) 22     Temp 04/28/20 0155 98.9 F (37.2 C)     Temp Source 04/28/20 0155 Oral     SpO2 04/28/20 0155 93 %     Weight 04/28/20 0153 163 lb (73.9 kg)     Height 04/28/20 0153 5\' 7"  (1.702 m)     Head Circumference --      Peak Flow --      Pain Score 04/28/20 0152 8     Pain Loc --      Pain Edu? --      Excl. in GC? --     Constitutional: Alert and oriented.  Uncomfortable appearing and in moderate acute distress. Eyes: Conjunctivae are normal. PERRL. EOMI. Head: Atraumatic. Nose: No congestion/rhinnorhea. Mouth/Throat: Mucous membranes are moist.   Neck: No stridor.   Cardiovascular: Normal rate, regular rhythm. Grossly normal heart sounds.  Good peripheral circulation. Respiratory: Increased respiratory effort.  No retractions. Lungs CTAB. Gastrointestinal: Soft and nontender to light or deep palpation. No distention. No abdominal bruits. No CVA tenderness. Musculoskeletal: No lower extremity tenderness nor edema.  No joint effusions. Neurologic:  Normal speech and language. No gross focal neurologic deficits are appreciated. No gait instability. Skin:  Skin is warm, dry and intact. No rash noted. Psychiatric: Mood and affect are tearful. Speech and behavior are normal.  ____________________________________________   LABS (all labs ordered are listed, but only abnormal results are displayed)  Labs Reviewed  RESP PANEL BY RT-PCR (FLU A&B, COVID) ARPGX2  HEMOGLOBIN A1C  CBC WITH DIFFERENTIAL/PLATELET  PROTIME-INR   APTT  COMPREHENSIVE METABOLIC PANEL  LIPID PANEL  TROPONIN I (HIGH SENSITIVITY)   ____________________________________________  EKG  ED ECG REPORT I, SUNG,JADE J, the attending physician, personally viewed and interpreted this ECG.   Date: 04/28/2020  EKG Time: 0156  Rate: 102  Rhythm: sinus tachycardia  Axis: Normal  Intervals:none  ST&T Change: STEMI lead V3 Significant change from 05/23/2017  ED ECG REPORT I, SUNG,JADE J, the attending physician, personally viewed and interpreted this ECG.   Date: 04/28/2020  EKG Time: 0208  Rate: 106  Rhythm: sinus tachycardia  Axis: Normal  Intervals:none  ST&T Change: ST elevation in lead V3 resolved   ____________________________________________  RADIOLOGY I, SUNG,JADE J, personally viewed and evaluated these images (plain radiographs) as part of my medical decision making, as well as reviewing the written report by the radiologist.  ED MD interpretation:  ***  Official radiology report(s): No results found.  ____________________________________________   PROCEDURES  Procedure(s) performed (including Critical Care):  .1-3 Lead EKG Interpretation Performed by: Irean Hong, MD Authorized by: Irean Hong, MD     Interpretation: abnormal     ECG rate:  106   ECG rate assessment: tachycardic     Rhythm: sinus tachycardia     Ectopy: none     Conduction: normal   Comments:     Placed on cardiac monitor to evaluate for arrhythmias    CRITICAL CARE Performed by: Irean Hong   Total critical care time: *** minutes  Critical care time was exclusive of separately billable procedures and treating other patients.  Critical care was necessary to treat or prevent imminent or life-threatening deterioration.  Critical care was time spent personally by me on the following activities: development of treatment plan with patient and/or surrogate as well as nursing, discussions with consultants, evaluation of patient's  response to treatment, examination of patient, obtaining history from patient or surrogate, ordering and performing treatments and interventions, ordering and review of laboratory studies, ordering and review of radiographic studies, pulse oximetry and re-evaluation of patient's condition.  ____________________________________________   INITIAL IMPRESSION / ASSESSMENT AND PLAN / ED COURSE  As part of my medical decision making, I reviewed the following data within the electronic MEDICAL RECORD NUMBER Nursing notes reviewed and incorporated, Labs reviewed, EKG interpreted, Old chart reviewed, Radiograph reviewed, Discussed with admitting physician and Notes from prior ED visits     40 year old male with history of NSTEMI presenting with chest pain. Differential diagnosis includes, but is not limited to, ACS, aortic dissection, pulmonary embolism, cardiac tamponade, pneumothorax, pneumonia, pericarditis, myocarditis, GI-related causes including esophagitis/gastritis, and musculoskeletal chest wall pain.    Code STEMI initiated.  Will obtain lab work, chest x-ray, administer aspirin and nitroglycerin.  Will discuss with cardiology.  Clinical Course as of 04/28/20 0214  Wed Apr 28, 2020  0207 Discussed with STEMI on-call cardiologist Dr. Okey Dupre who incidentally also saw patient in 2019.  Dr. Okey Dupre was able to see patient's EKGs, recommended a repeat with ST elevation in V3 now resolved.  Will cancel code STEMI for now but he does recommend medical management including heparin drip. [JS]    Clinical Course User Index [JS] Irean Hong, MD     ____________________________________________   FINAL CLINICAL IMPRESSION(S) / ED DIAGNOSES  Final diagnoses:  None     ED Discharge Orders    None      *Please note:  Dean Boyd was evaluated in Emergency Department on 04/28/2020 for the symptoms described in the history of present illness. He was evaluated in the context of the global COVID-19  pandemic, which necessitated consideration that the patient might be at risk for infection with the SARS-CoV-2 virus that causes COVID-19. Institutional protocols and algorithms that pertain to the evaluation of patients at risk for COVID-19 are in a state of rapid change based on information released by regulatory bodies including the CDC and federal and state organizations. These policies and algorithms were followed during the patient's care in the ED.  Some ED evaluations and interventions may be delayed as a result of limited staffing during and the pandemic.*   Note:  This document was prepared using Dragon voice recognition software and may include unintentional dictation errors.

## 2020-04-28 NOTE — ED Notes (Addendum)
Visitor came out stating patient wants food, pt offered ice chips. Pt stated "I do not want fucking water, give me food or I am leaving". Messaged Dr. Joylene Igo who ordered to keep pt npo for possible cardiac intervention in am along with fasting blood work. Reexplained to patient reason for diet order and that I had spoken to Dr. Joylene Igo who continued to keep clear fluids/ npo order. Pt stated "I am leaving take this shit out of me". This nurse messaged Dr. Joylene Igo about AMA, Dr. Joylene Igo asked to speak with patient over the phone but pt refused and left with visitor. AMA form signed by pt prior walking out and advised of possible consequences of leaving AMA as Dr. Joylene Igo explained to this nurse over the phone.

## 2020-04-28 NOTE — ED Notes (Signed)
X-ray at bedside

## 2020-04-28 NOTE — ED Provider Notes (Signed)
[facility] Emergency Department Provider Note   ____________________________________________   Event Date/Time   First MD Initiated Contact with Patient 04/28/20 0200     (approximate)  I have reviewed the triage vital signs and the nursing notes.   HISTORY  Chief Complaint Shortness of Breath and Chest Pain    HPI Dean Boyd is a 40 y.o. male who presents to the ED from home with a chief complaint of chest pain.  Patient reports he was awoken out of his sleep approximately 8 PM with chest pain and shortness of breath.  He was unable to find a ride to the hospital until now.  Endorses productive cough for the past few days.  History of STEMI in April 2019 with occluded left circumflex which was medically managed secondary to late presentation.  At that time substance abuse was contributory.  Patient denies current substance use.  Admits he has not taken any of his medications in years.  Denies recent fever, abdominal pain, nausea, vomiting or dizziness.     Past Medical History:  Diagnosis Date  . Alcohol abuse   . Coronary artery disease    a. 05/2017 Late presenting STEMI/Cath: LM nl, LAD nl, D1/2/3 nl, RI min irregs, LCX 164m, distal vessel fills via R->L collats, OM1 min irregs, RCA/RPDA/RPAV/RPL1/2 min irregs.  . Ischemic cardiomyopathy    a. 05/2017 Echo: EF 50-55%, mild asymmetric posterior hypertrophy. Sev basal-midinferolateral HK. Mild MR. Nl RV fxn. Nl RV fxn.  . Marijuana abuse   . Nausea and vomiting   . Tobacco abuse     Patient Active Problem List   Diagnosis Date Noted  . ST elevation myocardial infarction (STEMI) (HCC) 05/23/2017  . Polysubstance abuse (HCC) 05/23/2017  . Non-ST elevation myocardial infarction (NSTEMI) (HCC)   . Chest pain 05/16/2017  . Intractable vomiting with nausea 05/12/2016    Past Surgical History:  Procedure Laterality Date  . LEFT HEART CATH AND CORONARY ANGIOGRAPHY N/A 05/17/2017   Procedure: LEFT  HEART CATH AND CORONARY ANGIOGRAPHY;  Surgeon: Iran Ouch, MD;  Location: [facility] INVASIVE CV LAB;  Service: Cardiovascular;  Laterality: N/A;    Prior to Admission medications   Medication Sig Start Date End Date Taking? Authorizing Provider  aspirin 81 MG chewable tablet Chew 1 tablet (81 mg total) by mouth before cath procedure. Patient not taking: No sig reported 05/19/17   Altamese Dilling, MD  atorvastatin (LIPITOR) 20 MG tablet Take 1 tablet (20 mg total) by mouth daily. 05/23/17 08/21/17  End, Cristal Deer, MD  clopidogrel (PLAVIX) 75 MG tablet Take 1 tablet (75 mg total) by mouth daily with breakfast. Patient not taking: No sig reported 08/29/17   End, Cristal Deer, MD  metoprolol succinate (TOPROL XL) 25 MG 24 hr tablet Take 0.5 tablets (12.5 mg total) by mouth daily. Patient not taking: No sig reported 05/23/17   End, Cristal Deer, MD  nitroGLYCERIN (NITROSTAT) 0.4 MG SL tablet Place 1 tablet (0.4 mg total) under the tongue every 5 (five) minutes as needed for chest pain. Maximum of 3 doses. 05/23/17 08/21/17  End, Cristal Deer, MD    Allergies Patient has no known allergies.  Family History  Problem Relation Age of Onset  . High blood pressure Mother        alive in her mid-50's  . Heart failure Mother   . Sleep apnea Father        alive in his mid-50's  . Heart attack Maternal Grandmother   . Diabetes Maternal Grandmother  Social History Social History   Tobacco Use  . Smoking status: Current Every Day Smoker    Packs/day: 0.25    Years: 15.00    Pack years: 3.75    Types: Cigarettes  . Smokeless tobacco: Never Used  Vaping Use  . Vaping Use: Never used  Substance Use Topics  . Alcohol use: Yes    Comment: 50-60oz/beer per day (~ 5 bottles)  . Drug use: Yes    Types: Marijuana    Comment: Uses multiple mj cigarettes daily.  Has been vaping THC for the past week.    Review of Systems  Constitutional: No fever/chills Eyes: No visual changes. ENT: No sore  throat. Cardiovascular: Positive for chest pain. Respiratory: Positive for shortness of breath. Gastrointestinal: No abdominal pain.  No nausea, no vomiting.  No diarrhea.  No constipation. Genitourinary: Negative for dysuria. Musculoskeletal: Negative for back pain. Skin: Negative for rash. Neurological: Negative for headaches, focal weakness or numbness.   ____________________________________________   PHYSICAL EXAM:  VITAL SIGNS: ED Triage Vitals  Enc Vitals Group     BP --      Pulse Rate 04/28/20 0155 95     Resp 04/28/20 0155 (!) 22     Temp 04/28/20 0155 98.9 F (37.2 C)     Temp Source 04/28/20 0155 Oral     SpO2 04/28/20 0155 93 %     Weight 04/28/20 0153 163 lb (73.9 kg)     Height 04/28/20 0153 5\' 7"  (1.702 m)     Head Circumference --      Peak Flow --      Pain Score 04/28/20 0152 8     Pain Loc --      Pain Edu? --      Excl. in GC? --     Constitutional: Alert and oriented.  Uncomfortable appearing and in moderate acute distress. Eyes: Conjunctivae are normal. PERRL. EOMI. Head: Atraumatic. Nose: No congestion/rhinnorhea. Mouth/Throat: Mucous membranes are moist.   Neck: No stridor.   Cardiovascular: Normal rate, regular rhythm. Grossly normal heart sounds.  Good peripheral circulation. Respiratory: Increased respiratory effort.  No retractions. Lungs CTAB. Gastrointestinal: Soft and nontender to light or deep palpation. No distention. No abdominal bruits. No CVA tenderness. Musculoskeletal: No lower extremity tenderness nor edema.  No joint effusions. Neurologic:  Normal speech and language. No gross focal neurologic deficits are appreciated. No gait instability. Skin:  Skin is warm, dry and intact. No rash noted. Psychiatric: Mood and affect are tearful. Speech and behavior are normal.  ____________________________________________   LABS (all labs ordered are listed, but only abnormal results are displayed)  Labs Reviewed  CBC WITH  DIFFERENTIAL/PLATELET - Abnormal; Notable for the following components:      Result Value   WBC 11.7 (*)    Neutro Abs 8.9 (*)    Monocytes Absolute 1.2 (*)    All other components within normal limits  COMPREHENSIVE METABOLIC PANEL - Abnormal; Notable for the following components:   CO2 20 (*)    All other components within normal limits  LIPID PANEL - Abnormal; Notable for the following components:   Triglycerides 267 (*)    VLDL 53 (*)    All other components within normal limits  CBC - Abnormal; Notable for the following components:   Hemoglobin 12.8 (*)    All other components within normal limits  RESP PANEL BY RT-PCR (FLU A&B, COVID) ARPGX2  PROTIME-INR  APTT  ETHANOL  HEMOGLOBIN A1C  URINE DRUG SCREEN,  QUALITATIVE ([facility] ONLY)  HEPARIN LEVEL (UNFRACTIONATED)  HIV ANTIBODY (ROUTINE TESTING W REFLEX)  BASIC METABOLIC PANEL  LIPID PANEL  TROPONIN I (HIGH SENSITIVITY)  TROPONIN I (HIGH SENSITIVITY)   ____________________________________________  EKG  ED ECG REPORT I, Leota Maka J, the attending physician, personally viewed and interpreted this ECG.   Date: 04/28/2020  EKG Time: 0156  Rate: 102  Rhythm: sinus tachycardia  Axis: Normal  Intervals:none  ST&T Change: STEMI lead V3 Significant change from 05/23/2017  ED ECG REPORT I, Jadea Shiffer J, the attending physician, personally viewed and interpreted this ECG.   Date: 04/28/2020  EKG Time: 0208  Rate: 106  Rhythm: sinus tachycardia  Axis: Normal  Intervals:none  ST&T Change: ST elevation in lead V3 resolved   ____________________________________________  RADIOLOGY I, Demontez Novack J, personally viewed and evaluated these images (plain radiographs) as part of my medical decision making, as well as reviewing the written report by the radiologist.  ED MD interpretation: No acute cardiopulmonary process  Official radiology report(s): DG Chest 1 View  Result Date: 04/28/2020 CLINICAL DATA:  Short of breath,  productive cough EXAM: CHEST  1 VIEW COMPARISON:  05/16/2017 FINDINGS: Single frontal view of the chest demonstrates an unremarkable cardiac silhouette. External defibrillator pads overlie the cardiac apex. No airspace disease, effusion, or pneumothorax. No acute bony abnormalities. IMPRESSION: 1. No acute intrathoracic process. Electronically Signed   By: Sharlet SalinaMichael  Brown M.D.   On: 04/28/2020 02:14    ____________________________________________   PROCEDURES  Procedure(s) performed (including Critical Care):  .1-3 Lead EKG Interpretation Performed by: Irean HongSung, Yosselin Zoeller J, MD Authorized by: Irean HongSung, Kyle Luppino J, MD     Interpretation: abnormal     ECG rate:  106   ECG rate assessment: tachycardic     Rhythm: sinus tachycardia     Ectopy: none     Conduction: normal   Comments:     Placed on cardiac monitor to evaluate for arrhythmias    CRITICAL CARE Performed by: Irean HongSUNG,Shawnice Tilmon J   Total critical care time: 45 minutes  Critical care time was exclusive of separately billable procedures and treating other patients.  Critical care was necessary to treat or prevent imminent or life-threatening deterioration.  Critical care was time spent personally by me on the following activities: development of treatment plan with patient and/or surrogate as well as nursing, discussions with consultants, evaluation of patient's response to treatment, examination of patient, obtaining history from patient or surrogate, ordering and performing treatments and interventions, ordering and review of laboratory studies, ordering and review of radiographic studies, pulse oximetry and re-evaluation of patient's condition.  ____________________________________________   INITIAL IMPRESSION / ASSESSMENT AND PLAN / ED COURSE  As part of my medical decision making, I reviewed the following data within the electronic MEDICAL RECORD NUMBER Nursing notes reviewed and incorporated, Labs reviewed, EKG interpreted, Old chart reviewed,  Radiograph reviewed, Discussed with admitting physician and Notes from prior ED visits     40 year old male with history of NSTEMI presenting with chest pain. Differential diagnosis includes, but is not limited to, ACS, aortic dissection, pulmonary embolism, cardiac tamponade, pneumothorax, pneumonia, pericarditis, myocarditis, GI-related causes including esophagitis/gastritis, and musculoskeletal chest wall pain.    Code STEMI initiated.  Will obtain lab work, chest x-ray, administer aspirin and nitroglycerin.  Will discuss with cardiology.  Clinical Course as of 04/28/20 0504  Wed Apr 28, 2020  0207 Discussed with STEMI on-call cardiologist Dr. Okey DupreEnd who incidentally also saw patient in 2019.  Dr. Okey DupreEnd was able to see patient's EKGs,  recommended a repeat with ST elevation in V3 now resolved.  Will cancel code STEMI for now but he does recommend medical management including heparin drip. [JS]    Clinical Course User Index [JS] Irean Hong, MD     ____________________________________________   FINAL CLINICAL IMPRESSION(S) / ED DIAGNOSES  Final diagnoses:  NSTEMI (non-ST elevated myocardial infarction) [facility])  Chest pain, unspecified type     ED Discharge Orders    None      *Please note:  Klint Lezcano was evaluated in Emergency Department on 04/28/2020 for the symptoms described in the history of present illness. He was evaluated in the context of the global COVID-19 pandemic, which necessitated consideration that the patient might be at risk for infection with the SARS-CoV-2 virus that causes COVID-19. Institutional protocols and algorithms that pertain to the evaluation of patients at risk for COVID-19 are in a state of rapid change based on information released by regulatory bodies including the CDC and federal and state organizations. These policies and algorithms were followed during the patient's care in the ED.  Some ED evaluations and interventions may be delayed as a result of  limited staffing during and the pandemic.*   Note:  This document was prepared using Dragon voice recognition software and may include unintentional dictation errors.   Irean Hong, MD 04/28/20 [phone]

## 2020-04-28 NOTE — ED Notes (Signed)
Dr. Dolores Frame at bedside, RN x3 at bedside

## 2020-05-25 ENCOUNTER — Other Ambulatory Visit: Payer: Self-pay

## 2020-05-25 ENCOUNTER — Emergency Department
Admission: EM | Admit: 2020-05-25 | Discharge: 2020-05-25 | Disposition: A | Discharge status: Home / Self Care | Payer: PRIVATE HEALTH INSURANCE | Attending: Emergency Medicine | Admitting: Emergency Medicine

## 2020-05-25 ENCOUNTER — Emergency Department: Payer: PRIVATE HEALTH INSURANCE

## 2020-05-25 DIAGNOSIS — F17210 Nicotine dependence, cigarettes, uncomplicated: Secondary | ICD-10-CM | POA: Diagnosis not present

## 2020-05-25 DIAGNOSIS — Z20822 Contact with and (suspected) exposure to covid-19: Secondary | ICD-10-CM | POA: Insufficient documentation

## 2020-05-25 DIAGNOSIS — F1290 Cannabis use, unspecified, uncomplicated: Secondary | ICD-10-CM | POA: Diagnosis not present

## 2020-05-25 DIAGNOSIS — Z79899 Other long term (current) drug therapy: Secondary | ICD-10-CM | POA: Insufficient documentation

## 2020-05-25 DIAGNOSIS — R0789 Other chest pain: Secondary | ICD-10-CM | POA: Diagnosis not present

## 2020-05-25 DIAGNOSIS — R0602 Shortness of breath: Secondary | ICD-10-CM | POA: Diagnosis not present

## 2020-05-25 DIAGNOSIS — Z7902 Long term (current) use of antithrombotics/antiplatelets: Secondary | ICD-10-CM | POA: Diagnosis not present

## 2020-05-25 DIAGNOSIS — K859 Acute pancreatitis without necrosis or infection, unspecified: Secondary | ICD-10-CM | POA: Insufficient documentation

## 2020-05-25 DIAGNOSIS — I2510 Atherosclerotic heart disease of native coronary artery without angina pectoris: Secondary | ICD-10-CM | POA: Diagnosis not present

## 2020-05-25 LAB — CBC WITH DIFFERENTIAL/PLATELET
Abs Immature Granulocytes: 0.03 10*3/uL (ref 0.00–0.07)
Basophils Absolute: 0.1 10*3/uL (ref 0.0–0.1)
Basophils Relative: 1 %
Eosinophils Absolute: 0.2 10*3/uL (ref 0.0–0.5)
Eosinophils Relative: 2 %
HCT: 43.8 % (ref 39.0–52.0)
Hemoglobin: 14.2 g/dL (ref 13.0–17.0)
Immature Granulocytes: 0 %
Lymphocytes Relative: 21 %
Lymphs Abs: 2 10*3/uL (ref 0.7–4.0)
MCH: 28.5 pg (ref 26.0–34.0)
MCHC: 32.4 g/dL (ref 30.0–36.0)
MCV: 88 fL (ref 80.0–100.0)
Monocytes Absolute: 0.7 10*3/uL (ref 0.1–1.0)
Monocytes Relative: 7 %
Neutro Abs: 6.5 10*3/uL (ref 1.7–7.7)
Neutrophils Relative %: 69 %
Platelets: 242 10*3/uL (ref 150–400)
RBC: 4.98 MIL/uL (ref 4.22–5.81)
RDW: 12.9 % (ref 11.5–15.5)
WBC: 9.4 10*3/uL (ref 4.0–10.5)
nRBC: 0 % (ref 0.0–0.2)

## 2020-05-25 LAB — BASIC METABOLIC PANEL
Anion gap: 7 (ref 5–15)
BUN: 10 mg/dL (ref 6–20)
CO2: 22 mmol/L (ref 22–32)
Calcium: 8.8 mg/dL — ABNORMAL LOW (ref 8.9–10.3)
Chloride: 107 mmol/L (ref 98–111)
Creatinine, Ser: 1.1 mg/dL (ref 0.61–1.24)
GFR, Estimated: >60 mL/min (ref >60)
Glucose, Bld: 94 mg/dL (ref 70–99)
Potassium: 4.2 mmol/L (ref 3.5–5.1)
Sodium: 136 mmol/L (ref 135–145)

## 2020-05-25 LAB — HEPATIC FUNCTION PANEL
ALT: 14 U/L (ref 0–44)
AST: 21 U/L (ref 15–41)
Albumin: 4 g/dL (ref 3.5–5.0)
Alkaline Phosphatase: 48 U/L (ref 38–126)
Bilirubin, Direct: <0.1 mg/dL (ref 0.0–0.2)
Total Bilirubin: 0.7 mg/dL (ref 0.3–1.2)
Total Protein: 7.7 g/dL (ref 6.5–8.1)

## 2020-05-25 LAB — RESP PANEL BY RT-PCR (FLU A&B, COVID) ARPGX2
Influenza A by PCR: NEGATIVE
Influenza B by PCR: NEGATIVE
SARS Coronavirus 2 by RT PCR: NEGATIVE

## 2020-05-25 LAB — TROPONIN I (HIGH SENSITIVITY)
Troponin I (High Sensitivity): 5 ng/L (ref <18)
Troponin I (High Sensitivity): 5 ng/L (ref <18)

## 2020-05-25 LAB — LIPASE, BLOOD: Lipase: 59 U/L — ABNORMAL HIGH (ref 11–51)

## 2020-05-25 MED ORDER — ASPIRIN EC 81 MG PO TBEC: 81.0000 mg | DELAYED_RELEASE_TABLET | Freq: Every day | ORAL | 1 refills | Status: AC

## 2020-05-25 MED ORDER — CLOPIDOGREL BISULFATE 75 MG PO TABS: 75.0000 mg | ORAL_TABLET | Freq: Every day | ORAL | 1 refills | Status: AC

## 2020-05-25 MED ORDER — MORPHINE SULFATE (PF) 4 MG/ML IV SOLN
4.0000 mg | Freq: Once | INTRAVENOUS | Status: AC
  Administered 2020-05-25: 4 mg via INTRAVENOUS
  Filled 2020-05-25: qty 1

## 2020-05-25 MED ORDER — ASPIRIN EC 81 MG PO TBEC: 81.0000 mg | DELAYED_RELEASE_TABLET | Freq: Every day | ORAL | 1 refills | Status: DC

## 2020-05-25 MED ORDER — ASPIRIN 81 MG PO CHEW
324.0000 mg | CHEWABLE_TABLET | Freq: Once | ORAL | Status: AC
  Administered 2020-05-25: 324 mg via ORAL
  Filled 2020-05-25: qty 4

## 2020-05-25 MED ORDER — ATORVASTATIN CALCIUM 40 MG PO TABS: 40.0000 mg | ORAL_TABLET | Freq: Every day | ORAL | 1 refills | Status: DC

## 2020-05-25 MED ORDER — TRAMADOL HCL 50 MG PO TABS: 50.0000 mg | ORAL_TABLET | Freq: Four times a day (QID) | ORAL | 0 refills | Status: AC | PRN

## 2020-05-25 MED ORDER — ONDANSETRON HCL 4 MG/2ML IJ SOLN
4.0000 mg | Freq: Once | INTRAMUSCULAR | Status: AC
  Administered 2020-05-25: 4 mg via INTRAVENOUS
  Filled 2020-05-25: qty 2

## 2020-05-25 MED ORDER — CLOPIDOGREL BISULFATE 75 MG PO TABS: 75.0000 mg | ORAL_TABLET | Freq: Every day | ORAL | 1 refills | Status: DC

## 2020-05-25 NOTE — ED Triage Notes (Signed)
Pt arrives to ER c/o CP that woke him up that radiates to L shoulder with SOB. States had a MI last month. A&O upon arrival.

## 2020-05-25 NOTE — ED Notes (Signed)
Pt alert and oriented X 4, stable for discharge. RR even and unlabored, color WNL. Discussed discharge instructions and follow-up as directed. Discharge medications discussed if prescribed. Pt had opportunity to ask questions, and RN to provide patient/family eduction.  

## 2020-05-25 NOTE — ED Provider Notes (Signed)
[facility] Emergency Department Provider Note ____________________________________________   Event Date/Time   First MD Initiated Contact with Patient 05/25/20 0813     (approximate)  I have reviewed the triage vital signs and the nursing notes.   HISTORY  Chief Complaint Chest Pain    HPI Dean Boyd is a 40 y.o. male with PMH as noted below including CAD and STEMI who presents with chest pain, acute onset when he awoke this morning, described as sharp, mainly in the left side of his chest and radiating up towards his neck.  It feels like when he had a heart attack previously.  He reports associated shortness of breath and some nausea with an episode of vomiting.  He denies any associated dizziness or lightheadedness.  The patient states that he smokes marijuana but denies cocaine or other drug use.  He states he is not on any of his medications due to insurance issues, although he just got issued an insurance card so now will be able to take them.   Past Medical History:  Diagnosis Date  . Alcohol abuse   . Coronary artery disease    a. 05/2017 Late presenting STEMI/Cath: LM nl, LAD nl, D1/2/3 nl, RI min irregs, LCX 129m, distal vessel fills via R->L collats, OM1 min irregs, RCA/RPDA/RPAV/RPL1/2 min irregs.  . Ischemic cardiomyopathy    a. 05/2017 Echo: EF 50-55%, mild asymmetric posterior hypertrophy. Sev basal-midinferolateral HK. Mild MR. Nl RV fxn. Nl RV fxn.  . Marijuana abuse   . Nausea and vomiting   . Tobacco abuse     Patient Active Problem List   Diagnosis Date Noted  . ST elevation myocardial infarction (STEMI) (HCC) 05/23/2017  . Polysubstance abuse (HCC) 05/23/2017  . Non-ST elevation myocardial infarction (NSTEMI) (HCC)   . Chest pain 05/16/2017  . Intractable vomiting with nausea 05/12/2016    Past Surgical History:  Procedure Laterality Date  . LEFT HEART CATH AND CORONARY ANGIOGRAPHY N/A 05/17/2017   Procedure: LEFT HEART CATH  AND CORONARY ANGIOGRAPHY;  Surgeon: Iran Ouch, MD;  Location: [facility] INVASIVE CV LAB;  Service: Cardiovascular;  Laterality: N/A;    Prior to Admission medications   Medication Sig Start Date End Date Taking? Authorizing Provider  traMADol (ULTRAM) 50 MG tablet Take 1 tablet (50 mg total) by mouth every 6 (six) hours as needed for up to 3 days. 05/25/20 05/28/20 Yes Dionne Bucy, MD  aspirin EC 81 MG tablet Take 1 tablet (81 mg total) by mouth daily. Swallow whole. 05/25/20 07/24/20  Dionne Bucy, MD  atorvastatin (LIPITOR) 40 MG tablet Take 1 tablet (40 mg total) by mouth daily. 05/25/20 07/24/20  Dionne Bucy, MD  clopidogrel (PLAVIX) 75 MG tablet Take 1 tablet (75 mg total) by mouth daily. 05/25/20 07/24/20  Dionne Bucy, MD  metoprolol succinate (TOPROL XL) 25 MG 24 hr tablet Take 0.5 tablets (12.5 mg total) by mouth daily. Patient not taking: No sig reported 05/23/17   End, Cristal Deer, MD  nitroGLYCERIN (NITROSTAT) 0.4 MG SL tablet Place 1 tablet (0.4 mg total) under the tongue every 5 (five) minutes as needed for chest pain. Maximum of 3 doses. 05/23/17 08/21/17  End, Cristal Deer, MD    Allergies Patient has no known allergies.  Family History  Problem Relation Age of Onset  . High blood pressure Mother        alive in her mid-50's  . Heart failure Mother   . Sleep apnea Father        alive  in his mid-50's  . Heart attack Maternal Grandmother   . Diabetes Maternal Grandmother     Social History Social History   Tobacco Use  . Smoking status: Current Every Day Smoker    Packs/day: 0.25    Years: 15.00    Pack years: 3.75    Types: Cigarettes  . Smokeless tobacco: Never Used  Vaping Use  . Vaping Use: Never used  Substance Use Topics  . Alcohol use: Yes    Comment: 50-60oz/beer per day (~ 5 bottles)  . Drug use: Yes    Types: Marijuana    Comment: Uses multiple mj cigarettes daily.  Has been vaping THC for the past week.    Review of  Systems  Constitutional: No fever. Eyes: No redness. ENT: No sore throat. Cardiovascular: Positive for chest pain. Respiratory: Positive for shortness of breath. Gastrointestinal: Positive for vomiting. Genitourinary: Negative for flank pain. Musculoskeletal: Negative for back pain. Skin: Negative for rash. Neurological: Negative for headache.   ____________________________________________   PHYSICAL EXAM:  VITAL SIGNS: ED Triage Vitals  Enc Vitals Group     BP 05/25/20 0823 132/90     Pulse Rate 05/25/20 0820 79     Resp 05/25/20 0820 13     Temp 05/25/20 0820 97.8 F (36.6 C)     Temp Source 05/25/20 0820 Oral     SpO2 05/25/20 0820 99 %     Weight 05/25/20 0820 160 lb (72.6 kg)     Height 05/25/20 0820 5\' 7"  (1.702 m)     Head Circumference --      Peak Flow --      Pain Score 05/25/20 0820 8     Pain Loc --      Pain Edu? --      Excl. in GC? --     Constitutional: Alert and oriented.  Uncomfortable appearing but in no acute distress. Eyes: Conjunctivae are normal.  Head: Atraumatic. Nose: No congestion/rhinnorhea. Mouth/Throat: Mucous membranes are moist.   Neck: Normal range of motion.  Cardiovascular: Normal rate, regular rhythm. Grossly normal heart sounds.  Good peripheral circulation. Respiratory: Normal respiratory effort.  No retractions. Lungs CTAB. Gastrointestinal: Soft with mild epigastric tenderness.  No distention.  Genitourinary: No flank tenderness. Musculoskeletal: No lower extremity edema.  Extremities warm and well perfused.  Neurologic:  Normal speech and language. No gross focal neurologic deficits are appreciated.  Skin:  Skin is warm and dry. No rash noted. Psychiatric: Mood and affect are normal. Speech and behavior are normal.  ____________________________________________   LABS (all labs ordered are listed, but only abnormal results are displayed)  Labs Reviewed  LIPASE, BLOOD - Abnormal; Notable for the following components:       Result Value   Lipase 59 (*)    All other components within normal limits  BASIC METABOLIC PANEL - Abnormal; Notable for the following components:   Calcium 8.8 (*)    All other components within normal limits  RESP PANEL BY RT-PCR (FLU A&B, COVID) ARPGX2  CBC WITH DIFFERENTIAL/PLATELET  HEPATIC FUNCTION PANEL  URINALYSIS, COMPLETE (UACMP) WITH MICROSCOPIC  URINE DRUG SCREEN, QUALITATIVE ([facility] ONLY)  TROPONIN I (HIGH SENSITIVITY)  TROPONIN I (HIGH SENSITIVITY)   ____________________________________________  EKG  ED ECG REPORT I, 07/25/20, the attending physician, personally viewed and interpreted this ECG.  Date: 05/25/2020 EKG Time: 0817 Rate: 73 Rhythm: normal sinus rhythm QRS Axis: normal Intervals: normal ST/T Wave abnormalities: Early repolarization, nonspecific inferior and lateral T wave abnormalities Narrative  Interpretation: Nonspecific abnormalities with no evidence of acute ischemia; no significant change when compared with most recent EKG of 04/28/2020  ____________________________________________  RADIOLOGY  Chest x-ray interpreted by me shows no focal infiltrate or edema  ____________________________________________   PROCEDURES  Procedure(s) performed: No  Procedures  Critical Care performed: No ____________________________________________   INITIAL IMPRESSION / ASSESSMENT AND PLAN / ED COURSE  Pertinent labs & imaging results that were available during my care of the patient were reviewed by me and considered in my medical decision making (see chart for details).  40 year old male with PMH as noted above including history of CAD and NSTEMI presents with chest pain associated with shortness of breath and vomiting which he woke up with this morning.  I reviewed the past medical records in epic.  The patient has a history of a STEMI in 2019 at which time cast showed an occluded left circumflex.  He was lost to follow-up and  subsequently seen in the ED on 04/28/2020 with chest pain and an EKG showing ST elevations which resolved.  The patient was admitted and plan for catheterization but ended up signing out AMA.  He has been chronically noncompliant with medication.  On exam, the patient is uncomfortable appearing but in no acute distress.  His vital signs are normal.  The physical exam is unremarkable.  EKG at this time shows possible mild early repolarization anteriorly but no ST elevations and nonspecific T wave abnormalities unchanged from the last EKG on 3/22.  Differential includes ACS, pericarditis, musculoskeletal pain, GERD or other GI etiology; given the lack of tachycardia or hypoxia I do not suspect PE.  The patient has no signs or symptoms of VTE.  I also have a low suspicion for aortic dissection given the normal vital signs and lack of radiation to the back.  We will obtain a chest x-ray, lab work-up including troponins, and give aspirin and analgesia.  ----------------------------------------- 12:32 PM on 05/25/2020 -----------------------------------------  The patient's pain has resolved and he has been resting comfortably for the last several hours.  Troponins are negative x2.  The patient has a slightly elevated lipase but otherwise normal labs, so the pain could have been from very mild pancreatitis.  The patient states that he drinks beer but does not drink other alcohol.  At this time, the patient is well-appearing.  His vital signs have remained stable.  He is appropriate for discharge home.  I counseled him on the results of the work-up.  I advised him on a bland diet and precautions for possible mild pancreatitis as well as chest pain precautions.  The patient's wife reports that his insurance is now reinstated.  I reviewed his past records and confirm that he previously had been on aspirin, Plavix, and atorvastatin so I have prescribed a 1 month supply of these medications.  I have also given  the patient a referral to cardiology.  Return precautions given, and he expresses understanding.  ____________________________________________   FINAL CLINICAL IMPRESSION(S) / ED DIAGNOSES  Final diagnoses:  Acute pancreatitis without infection or necrosis, unspecified pancreatitis type  Atypical chest pain      NEW MEDICATIONS STARTED DURING THIS VISIT:  New Prescriptions   ASPIRIN EC 81 MG TABLET    Take 1 tablet (81 mg total) by mouth daily. Swallow whole.   ATORVASTATIN (LIPITOR) 40 MG TABLET    Take 1 tablet (40 mg total) by mouth daily.   CLOPIDOGREL (PLAVIX) 75 MG TABLET    Take 1 tablet (75  mg total) by mouth daily.   TRAMADOL (ULTRAM) 50 MG TABLET    Take 1 tablet (50 mg total) by mouth every 6 (six) hours as needed for up to 3 days.     Note:  This document was prepared using Dragon voice recognition software and may include unintentional dictation errors.    Dionne Bucy, MD 05/25/20 1235

## 2020-05-25 NOTE — ED Notes (Signed)
EDP at bedside  

## 2020-05-25 NOTE — Discharge Instructions (Addendum)
Your lab work-up shows mild pancreatitis which is inflammation around the pancreas which is a gland in your stomach.  You should eat a bland diet for the next several days and avoid any alcohol.  Make sure to drink plenty of fluids.  We have prescribed a 1 month supply of the heart medications that you were supposed to be on, with 1 refill.  You should fill these and start taking them immediately.  We have also prescribed a small quantity of pain medication in case you have continued abdominal pain over the next few days.  Follow-up with a cardiologist.  We have provided a referral to one of the doctors at the [place] [facility].    Return to the ER for new, worsening, or persistent severe chest or abdominal pain, vomiting, shortness of breath, fever, weakness, or any other new or worsening symptoms that concern you.

## 2021-03-30 ENCOUNTER — Emergency Department (HOSPITAL_COMMUNITY)
Admit: 2021-03-30 | Discharge: 2021-03-30 | Disposition: A | Discharge status: Home / Self Care | Payer: Self-pay | Attending: Cardiology | Admitting: Cardiology

## 2021-03-30 ENCOUNTER — Inpatient Hospital Stay
Admission: EM | Admit: 2021-03-30 | Discharge: 2021-04-01 | DRG: 247 | Disposition: A | Discharge status: Home / Self Care | Payer: Self-pay | Attending: Internal Medicine | Admitting: Internal Medicine

## 2021-03-30 ENCOUNTER — Other Ambulatory Visit: Payer: Self-pay

## 2021-03-30 ENCOUNTER — Emergency Department: Payer: Self-pay

## 2021-03-30 DIAGNOSIS — I2510 Atherosclerotic heart disease of native coronary artery without angina pectoris: Secondary | ICD-10-CM | POA: Diagnosis present

## 2021-03-30 DIAGNOSIS — Z833 Family history of diabetes mellitus: Secondary | ICD-10-CM

## 2021-03-30 DIAGNOSIS — IMO0001 Reserved for inherently not codable concepts without codable children: Secondary | ICD-10-CM

## 2021-03-30 DIAGNOSIS — T447X6A Underdosing of beta-adrenoreceptor antagonists, initial encounter: Secondary | ICD-10-CM | POA: Diagnosis present

## 2021-03-30 DIAGNOSIS — E785 Hyperlipidemia, unspecified: Secondary | ICD-10-CM | POA: Diagnosis present

## 2021-03-30 DIAGNOSIS — F1210 Cannabis abuse, uncomplicated: Secondary | ICD-10-CM | POA: Diagnosis present

## 2021-03-30 DIAGNOSIS — Z91138 Patient's unintentional underdosing of medication regimen for other reason: Secondary | ICD-10-CM

## 2021-03-30 DIAGNOSIS — I255 Ischemic cardiomyopathy: Secondary | ICD-10-CM | POA: Diagnosis present

## 2021-03-30 DIAGNOSIS — I214 Non-ST elevation (NSTEMI) myocardial infarction: Principal | ICD-10-CM | POA: Diagnosis present

## 2021-03-30 DIAGNOSIS — I252 Old myocardial infarction: Secondary | ICD-10-CM

## 2021-03-30 DIAGNOSIS — I2582 Chronic total occlusion of coronary artery: Secondary | ICD-10-CM | POA: Diagnosis present

## 2021-03-30 DIAGNOSIS — F17210 Nicotine dependence, cigarettes, uncomplicated: Secondary | ICD-10-CM | POA: Diagnosis present

## 2021-03-30 DIAGNOSIS — Z8249 Family history of ischemic heart disease and other diseases of the circulatory system: Secondary | ICD-10-CM

## 2021-03-30 DIAGNOSIS — R079 Chest pain, unspecified: Secondary | ICD-10-CM

## 2021-03-30 DIAGNOSIS — F1910 Other psychoactive substance abuse, uncomplicated: Secondary | ICD-10-CM | POA: Diagnosis present

## 2021-03-30 DIAGNOSIS — Z79899 Other long term (current) drug therapy: Secondary | ICD-10-CM

## 2021-03-30 DIAGNOSIS — I10 Essential (primary) hypertension: Secondary | ICD-10-CM | POA: Diagnosis present

## 2021-03-30 DIAGNOSIS — R12 Heartburn: Secondary | ICD-10-CM | POA: Diagnosis present

## 2021-03-30 DIAGNOSIS — I25110 Atherosclerotic heart disease of native coronary artery with unstable angina pectoris: Secondary | ICD-10-CM | POA: Diagnosis present

## 2021-03-30 DIAGNOSIS — D72828 Other elevated white blood cell count: Secondary | ICD-10-CM | POA: Diagnosis present

## 2021-03-30 DIAGNOSIS — Z20822 Contact with and (suspected) exposure to covid-19: Secondary | ICD-10-CM | POA: Diagnosis present

## 2021-03-30 DIAGNOSIS — F17200 Nicotine dependence, unspecified, uncomplicated: Secondary | ICD-10-CM

## 2021-03-30 LAB — HEPATIC FUNCTION PANEL
ALT: 18 U/L (ref 0–44)
AST: 20 U/L (ref 15–41)
Albumin: 4.1 g/dL (ref 3.5–5.0)
Alkaline Phosphatase: 52 U/L (ref 38–126)
Bilirubin, Direct: 0.1 mg/dL (ref 0.0–0.2)
Indirect Bilirubin: 0.7 mg/dL (ref 0.3–0.9)
Total Bilirubin: 0.8 mg/dL (ref 0.3–1.2)
Total Protein: 7.7 g/dL (ref 6.5–8.1)

## 2021-03-30 LAB — BASIC METABOLIC PANEL
Anion gap: 6 (ref 5–15)
BUN: 11 mg/dL (ref 6–20)
CO2: 24 mmol/L (ref 22–32)
Calcium: 9 mg/dL (ref 8.9–10.3)
Chloride: 106 mmol/L (ref 98–111)
Creatinine, Ser: 0.98 mg/dL (ref 0.61–1.24)
GFR, Estimated: >60 mL/min (ref >60)
Glucose, Bld: 109 mg/dL — ABNORMAL HIGH (ref 70–99)
Potassium: 3.9 mmol/L (ref 3.5–5.1)
Sodium: 136 mmol/L (ref 135–145)

## 2021-03-30 LAB — ECHOCARDIOGRAM COMPLETE
AR max vel: 2.52 cm2
AV Area VTI: 2.94 cm2
AV Area mean vel: 2.73 cm2
AV Mean grad: 3 mmHg
AV Peak grad: 6 mmHg
Ao pk vel: 1.22 m/s
Area-P 1/2: 5.42 cm2
Calc EF: 45.7 %
Height: 69 in
MV VTI: 3.23 cm2
S' Lateral: 4.33 cm
Single Plane A2C EF: 43.6 %
Single Plane A4C EF: 46.2 %
Weight: 2592 oz

## 2021-03-30 LAB — URINE DRUG SCREEN, QUALITATIVE (ARMC ONLY)
Amphetamines, Ur Screen: NOT DETECTED
Barbiturates, Ur Screen: NOT DETECTED
Benzodiazepine, Ur Scrn: NOT DETECTED
Cannabinoid 50 Ng, Ur ~~LOC~~: POSITIVE — AB
Cocaine Metabolite,Ur ~~LOC~~: NOT DETECTED
MDMA (Ecstasy)Ur Screen: NOT DETECTED
Methadone Scn, Ur: NOT DETECTED
Opiate, Ur Screen: NOT DETECTED
Phencyclidine (PCP) Ur S: NOT DETECTED
Tricyclic, Ur Screen: NOT DETECTED

## 2021-03-30 LAB — CBC
HCT: 45.6 % (ref 39.0–52.0)
Hemoglobin: 14.4 g/dL (ref 13.0–17.0)
MCH: 28.5 pg (ref 26.0–34.0)
MCHC: 31.6 g/dL (ref 30.0–36.0)
MCV: 90.1 fL (ref 80.0–100.0)
Platelets: 248 10*3/uL (ref 150–400)
RBC: 5.06 MIL/uL (ref 4.22–5.81)
RDW: 12.9 % (ref 11.5–15.5)
WBC: 12.1 10*3/uL — ABNORMAL HIGH (ref 4.0–10.5)
nRBC: 0 % (ref 0.0–0.2)

## 2021-03-30 LAB — TROPONIN I (HIGH SENSITIVITY)
Troponin I (High Sensitivity): 190 ng/L (ref <18)
Troponin I (High Sensitivity): 190 ng/L (ref <18)
Troponin I (High Sensitivity): 197 ng/L (ref <18)

## 2021-03-30 LAB — HEPARIN LEVEL (UNFRACTIONATED): Heparin Unfractionated: 0.32 IU/mL (ref 0.30–0.70)

## 2021-03-30 LAB — APTT: aPTT: 28 seconds (ref 24–36)

## 2021-03-30 LAB — LIPASE, BLOOD: Lipase: 46 U/L (ref 11–51)

## 2021-03-30 MED ORDER — MORPHINE SULFATE (PF) 4 MG/ML IV SOLN
4.0000 mg | Freq: Once | INTRAVENOUS | Status: AC
  Administered 2021-03-30: 4 mg via INTRAVENOUS
  Filled 2021-03-30: qty 1

## 2021-03-30 MED ORDER — NICOTINE 21 MG/24HR TD PT24
21.0000 mg | MEDICATED_PATCH | Freq: Every day | TRANSDERMAL | Status: DC
Start: 2021-03-30 — End: 2021-04-01
  Administered 2021-03-30 – 2021-04-01 (×3): 21 mg via TRANSDERMAL
  Filled 2021-03-30 (×3): qty 1

## 2021-03-30 MED ORDER — ATORVASTATIN CALCIUM 80 MG PO TABS
80.0000 mg | ORAL_TABLET | Freq: Every day | ORAL | Status: DC
  Administered 2021-03-30 – 2021-04-01 (×3): 80 mg via ORAL
  Filled 2021-03-30: qty 1
  Filled 2021-03-30: qty 4
  Filled 2021-03-30: qty 1

## 2021-03-30 MED ORDER — SODIUM CHLORIDE 0.9% FLUSH: 3.0000 mL | INTRAVENOUS | Status: DC | PRN

## 2021-03-30 MED ORDER — ASPIRIN EC 81 MG PO TBEC
81.0000 mg | DELAYED_RELEASE_TABLET | Freq: Every day | ORAL | Status: DC
  Administered 2021-03-30 – 2021-04-01 (×2): 81 mg via ORAL
  Filled 2021-03-30 (×2): qty 1

## 2021-03-30 MED ORDER — HEPARIN BOLUS VIA INFUSION
4000.0000 [IU] | Freq: Once | INTRAVENOUS | Status: AC
  Administered 2021-03-30: 4000 [IU] via INTRAVENOUS
  Filled 2021-03-30: qty 4000

## 2021-03-30 MED ORDER — NITROGLYCERIN 0.4 MG SL SUBL
0.4000 mg | SUBLINGUAL_TABLET | SUBLINGUAL | Status: DC | PRN
  Administered 2021-03-31 (×2): 0.4 mg via SUBLINGUAL
  Filled 2021-03-30: qty 1

## 2021-03-30 MED ORDER — ACETAMINOPHEN 325 MG PO TABS: 650.0000 mg | ORAL_TABLET | ORAL | Status: DC | PRN

## 2021-03-30 MED ORDER — SODIUM CHLORIDE 0.9 % WEIGHT BASED INFUSION
3.0000 mL/kg/h | INTRAVENOUS | Status: DC
  Administered 2021-03-31: 3 mL/kg/h via INTRAVENOUS

## 2021-03-30 MED ORDER — ASPIRIN 81 MG PO CHEW
81.0000 mg | CHEWABLE_TABLET | ORAL | Status: AC
  Administered 2021-03-31: 81 mg via ORAL
  Filled 2021-03-30: qty 1

## 2021-03-30 MED ORDER — PERFLUTREN LIPID MICROSPHERE
1.0000 mL | INTRAVENOUS | Status: AC | PRN
  Administered 2021-03-30: 2 mL via INTRAVENOUS
  Filled 2021-03-30: qty 10

## 2021-03-30 MED ORDER — SODIUM CHLORIDE 0.9 % WEIGHT BASED INFUSION
1.0000 mL/kg/h | INTRAVENOUS | Status: DC
  Administered 2021-03-31: 1 mL/kg/h via INTRAVENOUS

## 2021-03-30 MED ORDER — PANTOPRAZOLE SODIUM 40 MG PO TBEC
40.0000 mg | DELAYED_RELEASE_TABLET | Freq: Every day | ORAL | Status: DC
  Administered 2021-03-31 – 2021-04-01 (×2): 40 mg via ORAL
  Filled 2021-03-30 (×2): qty 1

## 2021-03-30 MED ORDER — SODIUM CHLORIDE 0.9 % IV SOLN: 250.0000 mL | INTRAVENOUS | Status: DC | PRN

## 2021-03-30 MED ORDER — ALUM & MAG HYDROXIDE-SIMETH 200-200-20 MG/5ML PO SUSP
30.0000 mL | ORAL | Status: DC | PRN
  Administered 2021-03-30 – 2021-03-31 (×2): 30 mL via ORAL
  Filled 2021-03-30 (×2): qty 30

## 2021-03-30 MED ORDER — ONDANSETRON HCL 4 MG/2ML IJ SOLN: 4.0000 mg | Freq: Four times a day (QID) | INTRAMUSCULAR | Status: DC | PRN

## 2021-03-30 MED ORDER — ASPIRIN 300 MG RE SUPP: 300.0000 mg | RECTAL | Status: DC

## 2021-03-30 MED ORDER — ASPIRIN 81 MG PO CHEW
324.0000 mg | CHEWABLE_TABLET | Freq: Once | ORAL | Status: AC
  Administered 2021-03-30: 324 mg via ORAL
  Filled 2021-03-30: qty 4

## 2021-03-30 MED ORDER — ONDANSETRON HCL 4 MG/2ML IJ SOLN
4.0000 mg | Freq: Once | INTRAMUSCULAR | Status: AC
  Administered 2021-03-30: 4 mg via INTRAVENOUS
  Filled 2021-03-30: qty 2

## 2021-03-30 MED ORDER — HEPARIN (PORCINE) 25000 UT/250ML-% IV SOLN
1050.0000 [IU]/h | INTRAVENOUS | Status: DC
  Administered 2021-03-30: 900 [IU]/h via INTRAVENOUS
  Filled 2021-03-30 (×2): qty 250

## 2021-03-30 MED ORDER — SODIUM CHLORIDE 0.9% FLUSH
3.0000 mL | Freq: Two times a day (BID) | INTRAVENOUS | Status: DC
  Administered 2021-03-30 – 2021-03-31 (×2): 3 mL via INTRAVENOUS

## 2021-03-30 MED ORDER — ASPIRIN 81 MG PO CHEW: 324.0000 mg | CHEWABLE_TABLET | ORAL | Status: DC

## 2021-03-30 MED ORDER — METOPROLOL SUCCINATE ER 25 MG PO TB24
12.5000 mg | ORAL_TABLET | Freq: Every day | ORAL | Status: DC
  Administered 2021-03-30 – 2021-04-01 (×3): 12.5 mg via ORAL
  Filled 2021-03-30: qty 0.5
  Filled 2021-03-30 (×2): qty 1

## 2021-03-30 MED ORDER — PANTOPRAZOLE SODIUM 40 MG IV SOLR
40.0000 mg | Freq: Once | INTRAVENOUS | Status: AC
  Administered 2021-03-30: 40 mg via INTRAVENOUS
  Filled 2021-03-30: qty 10

## 2021-03-30 MED ORDER — INFLUENZA VAC SPLIT QUAD 0.5 ML IM SUSY: 0.5000 mL | PREFILLED_SYRINGE | INTRAMUSCULAR | Status: DC

## 2021-03-30 NOTE — Progress Notes (Signed)
Cross Cover Patient complains of heartburn. Hx of alcohol/polysubstance abuse.  Do not see history of GERD or PUD. Started stress ulcer prophylaxis and prn maalox

## 2021-03-30 NOTE — Consult Note (Signed)
ANTICOAGULATION CONSULT NOTE  Pharmacy Consult for heparin infusion Indication: chest pain/ACS  Patient Measurements: Height: 5\' 9"  (175.3 cm) Weight: 73.5 kg (162 lb) IBW/kg (Calculated) : 70.7 Heparin Dosing Weight: 73.5kg  Labs: Recent Labs    03/30/21 1055 03/30/21 1246 03/30/21 1608 03/30/21 2005  HGB 14.4  --   --   --   HCT 45.6  --   --   --   PLT 248  --   --   --   APTT 28  --   --   --   HEPARINUNFRC  --   --   --  0.32  CREATININE 0.98  --   --   --   TROPONINIHS 190* 190* 197*  --      Estimated Creatinine Clearance: 100.2 mL/min (by C-G formula based on SCr of 0.98 mg/dL).   Medical History: Past Medical History:  Diagnosis Date   Alcohol abuse    Coronary artery disease    a. 05/2017 Late presenting STEMI/Cath: LM nl, LAD nl, D1/2/3 nl, RI min irregs, LCX 177m, distal vessel fills via R->L collats, OM1 min irregs, RCA/RPDA/RPAV/RPL1/2 min irregs.   Ischemic cardiomyopathy    a. 05/2017 Echo: EF 50-55%, mild asymmetric posterior hypertrophy. Sev basal-midinferolateral HK. Mild MR. Nl RV fxn. Nl RV fxn.   Marijuana abuse    Nausea and vomiting    Tobacco abuse     Medications:  PTA:N/A Inpatient: Heparin drip (2/15 >>> )  Allergies: No AC/APT related allergies  Assessment: 41 y.o. male with a hx of CAD, HTN, noncompliance, leaving AMA, polysubstance abuse who is being seen for possible NSTEMI. Pharmacy consulted for heparin drip in the setting of ACS/Chest pain.    Date Time HL Rate/Comment 2/15 2005 0.32 900 un/hr / thera x 1       Baseline Labs: aPTT - 28 Hgb - 14.4 Plts - 248  Goal of Therapy:  Heparin level 0.3-0.7 units/ml Monitor platelets by anticoagulation protocol: Yes  Plan:  --Heparin level is therapeutic x 1 --Maintain heparin infusion at 900 units/hr --Re-check confirmatory HL in 6 hours --Daily CBC per protocol while on IV heparin   2006 03/30/2021 10:19 PM

## 2021-03-30 NOTE — ED Notes (Signed)
Pt informed of need for urine sample at this time. Pt given urinal

## 2021-03-30 NOTE — Consult Note (Signed)
Cardiology Consultation:   Patient ID: Dean Boyd MRN: [id number]; DOB: 02/26/1980  Admit date: 03/30/2021 Date of Consult: 03/30/2021  PCP:  Oneita Hurt No   [facility] Providers Cardiologist:  Dean Kendall, MD   {   Patient Profile:   Dean Boyd is a 41 y.o. male with a hx of CAD, HTN, HLD, noncompliance, leaving AMA, polysubstance abuse who is being seen 03/30/2021 for the evaluation of NSTEMI at the request of Dr. Erma Heritage.  History of Present Illness:   Dean Boyd had an inferior STEMI in 2019 at which time cath showed occluded Lcx with collaterals noted from the RCA. Intervention was not attempted, as patient presented 3 days earlier with STEMI but left AMA and then returned a few days later with chest pain.   ER visit 04/28/20 for concern for STEMI. EKG showed ST with 27mm STE in V3 and inferolateral T wave inversions. Repeat EKG showed ST with resolution of changes. Since EKG did not show significant elevation in at least 2 contiguous leads and repeat EKG with absence of STE, no further work-up was done.   The patient presented to the ER for chest pain. Started yesterday while at rest. Pain located in the center of the chest and down into the arms. It was a pressure, 8/10. Lasted all afternoon through the night which prompted him to go to the ER. Had associated sob, nasuea, diaphoresis. Pain similar to prior  heart attack. No recent fever, chills, illness, LLE, orthopnea, or pnd. Has not had any cardiac medications for over a year, could not afford them. He smokes cigarettes, 1/2 ppd. No alcohol use. Does not work. Lives by himself.   In the ER troponin elevated to 190. WBC 12.1, Scr 0.98, BUN 11, normal LFTs, Hgb 14.4. BP 138/95, pulse 93, RR 15, 97%O2. CXR nonacute. EKG shows SR with old inferolateral TWI. Patient was given ASA 324mg , started on IV heparin and admitted.  Patient reports he still has chest pain. Of note, chest is TTP on exam.   Past Medical History:   Diagnosis Date   Alcohol abuse    Coronary artery disease    a. 05/2017 Late presenting STEMI/Cath: LM nl, LAD nl, D1/2/3 nl, RI min irregs, LCX 12m, distal vessel fills via R->L collats, OM1 min irregs, RCA/RPDA/RPAV/RPL1/2 min irregs.   Ischemic cardiomyopathy    a. 05/2017 Echo: EF 50-55%, mild asymmetric posterior hypertrophy. Sev basal-midinferolateral HK. Mild MR. Nl RV fxn. Nl RV fxn.   Marijuana abuse    Nausea and vomiting    Tobacco abuse     Past Surgical History:  Procedure Laterality Date   LEFT HEART CATH AND CORONARY ANGIOGRAPHY N/A 05/17/2017   Procedure: LEFT HEART CATH AND CORONARY ANGIOGRAPHY;  Surgeon: 07/17/2017, MD;  Location: [facility] INVASIVE CV LAB;  Service: Cardiovascular;  Laterality: N/A;     Home Medications:  Prior to Admission medications   Medication Sig Start Date End Date Taking? Authorizing Provider  atorvastatin (LIPITOR) 40 MG tablet Take 1 tablet (40 mg total) by mouth daily. 05/25/20 07/24/20  09/23/20, MD  metoprolol succinate (TOPROL XL) 25 MG 24 hr tablet Take 0.5 tablets (12.5 mg total) by mouth daily. Patient not taking: Reported on 04/28/2020 05/23/17   End, 07/23/17, MD  nitroGLYCERIN (NITROSTAT) 0.4 MG SL tablet Place 1 tablet (0.4 mg total) under the tongue every 5 (five) minutes as needed for chest pain. Maximum of 3 doses. 05/23/17 08/21/17  10/22/17, MD    Inpatient Medications: Scheduled  Meds:  Continuous Infusions:  PRN Meds: nitroGLYCERIN  Allergies:   No Known Allergies  Social History:   Social History   Socioeconomic History   Marital status: Married    Spouse name: Not on file   Number of children: Not on file   Years of education: Not on file   Highest education level: Not on file  Occupational History   Occupation: Unemployed  Tobacco Use   Smoking status: Every Day    Packs/day: 0.25    Years: 15.00    Pack years: 3.75    Types: Cigarettes   Smokeless tobacco: Never  Vaping Use    Vaping Use: Never used  Substance and Sexual Activity   Alcohol use: Yes    Comment: 50-60oz/beer per day (~ 5 bottles)   Drug use: Yes    Types: Marijuana    Comment: Uses multiple mj cigarettes daily.  Has been vaping THC for the past week.   Sexual activity: Yes  Other Topics Concern   Not on file  Social History Narrative   Lives in [place] with his wife and their children.  Does not routinely exercise but says he's 'always doing something.'  Enjoys cooking.   Social Determinants of Health   Financial Resource Strain: Not on file  Food Insecurity: Not on file  Transportation Needs: Not on file  Physical Activity: Not on file  Stress: Not on file  Social Connections: Not on file  Intimate Partner Violence: Not on file    Family History:    Family History  Problem Relation Age of Onset   High blood pressure Mother        alive in her mid-52's   Heart failure Mother    Sleep apnea Father        alive in his mid-50's   Heart attack Maternal Grandmother    Diabetes Maternal Grandmother      ROS:  Please see the history of present illness.   All other ROS reviewed and negative.     Physical Exam/Data:   Vitals:   03/30/21 0949 03/30/21 1030 03/30/21 1100 03/30/21 1130  BP: (!) 138/95 (!) 130/94 (!) 133/95 (!) 129/97  Pulse: 93 87 80 75  Resp: 15 13 15 13   Temp: 98.1 F (36.7 C)     TempSrc: Oral     SpO2: 97% 97% 98% 97%   No intake or output data in the 24 hours ending 03/30/21 1232 Last 3 Weights 05/25/2020 04/28/2020 05/23/2017  Weight (lbs) 160 lb 163 lb 166 lb 12 oz  Weight (kg) 72.576 kg 73.936 kg 75.637 kg     There is no height or weight on file to calculate BMI.  General:  Well nourished, well developed, in no acute distress HEENT: normal Neck: no JVD Vascular: No carotid bruits; Distal pulses 2+ bilaterally Cardiac:  normal S1, S2; RRR; no murmur; TTP Lungs:  clear to auscultation bilaterally, no wheezing, rhonchi or rales  Abd: soft, nontender, no  hepatomegaly  Ext: no edema Musculoskeletal:  No deformities, BUE and BLE strength normal and equal Skin: warm and dry  Neuro:  CNs 2-12 intact, no focal abnormalities noted Psych:  Normal affect   EKG:  The EKG was personally reviewed and demonstrates:  NSR, 85bpm, RAE, TWI inferolateral leads Telemetry:  Telemetry was personally reviewed and demonstrates:  NSR HR 90s, PVCs  Relevant CV Studies:  Echo 05/2017 Study Conclusions   - Left ventricle: The cavity size was normal. There was mild  asymmetric hypertrophy of the posterior wall. Systolic function    was normal. The estimated ejection fraction was in the range of    50% to 55%. There is severe hypokinesis of the    basal-midinferolateral myocardium.  - Mitral valve: There was mild regurgitation.  - Right ventricle: The cavity size was normal. Systolic function    was normal.   Cardiac cath 2019 Prox Cx to Mid Cx lesion is 100% stenosed.   1.  Severe 1 vessel coronary artery disease with occluded mid left circumflex with collaterals noted from the right coronary artery.  No other obstructive disease.  Ostial RCA spasm resolved with intracoronary nitroglycerin. 2.  Mildly elevated left ventricular end-diastolic pressure.  Left ventricular angiography was not performed.  EF was 50-55% by echo.   Recommendations: The left circumflex is a culprit but has been occluded for more than 48 hours with collaterals noted from the right coronary artery.  I recommend medical therapy and reserving PCI for refractory anginal symptoms. Recommend clopidogrel for 1 year. Aggressive treatment of risk factors, smoking cessation and cardiac rehab. Likely discharge home tomorrow.  Coronary Diagrams  Diagnostic Dominance: Right    Laboratory Data:  High Sensitivity Troponin:   Recent Labs  Lab 03/30/21 1055  TROPONINIHS 190*     Chemistry Recent Labs  Lab 03/30/21 1055  NA 136  K 3.9  CL 106  CO2 24  GLUCOSE 109*  BUN 11   CREATININE 0.98  CALCIUM 9.0  GFRNONAA >60  ANIONGAP 6    Recent Labs  Lab 03/30/21 1055  PROT 7.7  ALBUMIN 4.1  AST 20  ALT 18  ALKPHOS 52  BILITOT 0.8   Lipids No results for input(s): CHOL, TRIG, HDL, LABVLDL, LDLCALC, CHOLHDL in the last 168 hours.  Hematology Recent Labs  Lab 03/30/21 1055  WBC 12.1*  RBC 5.06  HGB 14.4  HCT 45.6  MCV 90.1  MCH 28.5  MCHC 31.6  RDW 12.9  PLT 248   Thyroid No results for input(s): TSH, FREET4 in the last 168 hours.  BNPNo results for input(s): BNP, PROBNP in the last 168 hours.  DDimer No results for input(s): DDIMER in the last 168 hours.   Radiology/Studies:  DG Chest 2 View  Result Date: 03/30/2021 CLINICAL DATA:  Chest pain EXAM: CHEST - 2 VIEW COMPARISON:  Chest x-ray 05/25/2020 FINDINGS: Heart size and mediastinal contours are within normal limits. No suspicious pulmonary opacities identified. No pleural effusion or pneumothorax visualized. No acute osseous abnormality appreciated. IMPRESSION: No acute intrathoracic process identified. Electronically Signed   By: Jannifer Hickelaney  Williams M.D.   On: 03/30/2021 10:17     Assessment and Plan:   NSTEMI CAD prior STEMI in 2019 with known 100% Lcx occlusion - presented with persistent chest pressure with associated sob, nasuea, diaphoresis, similar to prior MI. Also chest is TTP on exam - Has not been on any medications for over a year due to financial situation - HS trop 190, continue to trend - EKG with NSR and old inferolateral TWI,however maybe more pronounced - Other labs show mild leukocytosis, normal kidney function - denies recent drug use, UDS pending - CXR unremarkable - Start IV heparin - start Aspirin 81mg  daily, statin, BB - reports persistent chest pain, may need Nitro drip - he has known CAD with 100% occlusion of the Lcx with collaterals from the RCA by cath in 2019. LVEF was normal at that time.Recs were for PCI for refractory symptoms. He was started on  Aspirin  and plavix for 1 year. - Patient is willing to repeat cath.  Risks and benefits of cardiac catheterization have been discussed with the patient.  These include bleeding, infection, kidney damage, stroke, heart attack, death.  The patient understands these risks and is willing to proceed.  - NPO at midnight, or sooner if pain does not improve.   HTN - PTA not on medications - Bps mildly up - start BB as above  Polysubstance abuse - denies recent substance use - UDS ordered  Tobacco use - smokes 1/2 ppd - cessation advised   For questions or updates, please contact [facility] Please consult [email or web address] for contact info under    Signed, Denarius Sesler David Stall, PA-C  03/30/2021 12:32 PM

## 2021-03-30 NOTE — ED Triage Notes (Signed)
Pt c/o chest pain since yesterday with nausea, SOB and diaphoresis, states he has a hx of MI x4 in the past

## 2021-03-30 NOTE — Consult Note (Signed)
ANTICOAGULATION CONSULT NOTE - Initial Consult  Pharmacy Consult for heparin infusion Indication: chest pain/ACS  No Known Allergies  Patient Measurements: Height: 5\' 9"  (175.3 cm) Weight: 73.5 kg (162 lb) IBW/kg (Calculated) : 70.7 Heparin Dosing Weight: 73.5kg  Vital Signs: Temp: 98.1 F (36.7 C) (02/15 0949) Temp Source: Oral (02/15 0949) BP: 153/97 (02/15 1300) Pulse Rate: 82 (02/15 1300)  Labs: Recent Labs    03/30/21 1055  HGB 14.4  HCT 45.6  PLT 248  CREATININE 0.98  TROPONINIHS 190*    Estimated Creatinine Clearance: 100.2 mL/min (by C-G formula based on SCr of 0.98 mg/dL).   Medical History: Past Medical History:  Diagnosis Date   Alcohol abuse    Coronary artery disease    a. 05/2017 Late presenting STEMI/Cath: LM nl, LAD nl, D1/2/3 nl, RI min irregs, LCX 163m, distal vessel fills via R->L collats, OM1 min irregs, RCA/RPDA/RPAV/RPL1/2 min irregs.   Ischemic cardiomyopathy    a. 05/2017 Echo: EF 50-55%, mild asymmetric posterior hypertrophy. Sev basal-midinferolateral HK. Mild MR. Nl RV fxn. Nl RV fxn.   Marijuana abuse    Nausea and vomiting    Tobacco abuse     Medications:  PTA:N/A Inpatient: Heparin drip (2/15 >>> )  Allergies: No AC/APT related allergies  Assessment: 41 y.o. male with a hx of CAD, HTN, noncompliance, leaving AMA, polysubstance abuse who is being seen for possible NSTEMI. Pharmacy consulted for heparin drip in the setting of ACS/Chest pain.    Date Time HL Rate/Comment 2/15         Baseline Labs: aPTT - ordered STAT Hgb - 14.4 Plts - 248  Goal of Therapy:  Heparin level 0.3-0.7 units/ml Monitor platelets by anticoagulation protocol: Yes  Plan:  Give 4000 units bolus x1; then start heparin infusion at 850 units/hr Check anti-Xa level in 6 hours and daily once consecutively therapeutic. Continue to monitor H&H and platelets daily while on heparin gtt.   3/15, PharmD, MS PGPM Clinical  Pharmacist 03/30/2021 1:24 PM

## 2021-03-30 NOTE — Plan of Care (Signed)

## 2021-03-30 NOTE — ED Provider Notes (Signed)
[facility] Provider Note    Event Date/Time   First MD Initiated Contact with Patient 03/30/21 1009     (approximate)   History   Chest Pain   HPI  Dean Boyd is a 41 y.o. male with past medical history of severe one-vessel coronary disease based on cath in 2019, not on any medications currently, here with chest pain.  The patient states that over the last several weeks, he has had intermittent, dull, pressure-like chest pain.  This has been fairly recurrent but increasingly severe.  He admits that he was unable to afford his Plavix and has been taking essentially a baby aspirin only.  He has been noticing the pain has been with exertion.  It does feel somewhat similar to his previous episodes of acute coronary syndrome.  Denies any drug use.  No sputum production.  No cough.  No leg swelling.     Physical Exam   Triage Vital Signs: ED Triage Vitals [03/30/21 0949]  Enc Vitals Group     BP (!) 138/95     Pulse Rate 93     Resp 15     Temp 98.1 F (36.7 C)     Temp Source Oral     SpO2 97 %     Weight      Height      Head Circumference      Peak Flow      Pain Score      Pain Loc      Pain Edu?      Excl. in GC?     Most recent vital signs: Vitals:   03/30/21 1400 03/30/21 1616  BP: (!) 128/99 (!) 136/108  Pulse: 74 82  Resp: 15   Temp:  98.6 F (37 C)  SpO2: 97% 94%     General: Awake, no distress.  CV:  Good peripheral perfusion. RRR, no murmurs. Resp:  Normal effort. Lungs CTAB with no w/r/r. Abd:  No distention. No tenderness, rebound, or guarding. Other:  No focal neurological deficits.   ED Results / Procedures / Treatments   Labs (all labs ordered are listed, but only abnormal results are displayed) Labs Reviewed  BASIC METABOLIC PANEL - Abnormal; Notable for the following components:      Result Value   Glucose, Bld 109 (*)    All other components within normal limits  CBC - Abnormal; Notable for the  following components:   WBC 12.1 (*)    All other components within normal limits  URINE DRUG SCREEN, QUALITATIVE ([facility] ONLY) - Abnormal; Notable for the following components:   Cannabinoid 50 Ng, Ur [place] POSITIVE (*)    All other components within normal limits  TROPONIN I (HIGH SENSITIVITY) - Abnormal; Notable for the following components:   Troponin I (High Sensitivity) 190 (*)    All other components within normal limits  TROPONIN I (HIGH SENSITIVITY) - Abnormal; Notable for the following components:   Troponin I (High Sensitivity) 190 (*)    All other components within normal limits  TROPONIN I (HIGH SENSITIVITY) - Abnormal; Notable for the following components:   Troponin I (High Sensitivity) 197 (*)    All other components within normal limits  HEPATIC FUNCTION PANEL  LIPASE, BLOOD  APTT  HEPARIN LEVEL (UNFRACTIONATED)  CBC  LIPID PANEL  BASIC METABOLIC PANEL  CBC  TROPONIN I (HIGH SENSITIVITY)     EKG Normal sinus rhythm, ventricular rate 85.  PR 140, QRS 84, QTc  4.6.  No acute ST elevations.  There are deep T wave inversions in the inferior and lateral precordial leads which do appear worsened from prior.   RADIOLOGY Chest x-ray: No focal abnormality   I also independently reviewed and agree wit radiologist interpretations.   PROCEDURES:  Critical Care performed: Yes, see critical care procedure note(s)  .1-3 Lead EKG Interpretation Performed by: Shaune Pollack, MD Authorized by: Shaune Pollack, MD     Interpretation: normal     ECG rate:  70-90   ECG rate assessment: normal     Rhythm: sinus rhythm     Ectopy: none     Conduction: normal   Comments:     Indication: Chest pain, shortness of breath .Critical Care Performed by: Shaune Pollack, MD Authorized by: Shaune Pollack, MD   Critical care provider statement:    Critical care time (minutes):  30   Critical care time was exclusive of:  Separately billable procedures and treating other  patients   Critical care was necessary to treat or prevent imminent or life-threatening deterioration of the following conditions:  Cardiac failure, circulatory failure and respiratory failure   Critical care was time spent personally by me on the following activities:  Development of treatment plan with patient or surrogate, discussions with consultants, evaluation of patient's response to treatment, examination of patient, ordering and review of laboratory studies, ordering and review of radiographic studies, ordering and performing treatments and interventions, pulse oximetry, re-evaluation of patient's condition and review of old charts   Care discussed with: admitting provider      MEDICATIONS ORDERED IN ED: Medications  nitroGLYCERIN (NITROSTAT) SL tablet 0.4 mg (has no administration in time range)  heparin ADULT infusion 100 units/mL (25000 units/270mL) (900 Units/hr Intravenous New Bag/Given 03/30/21 1430)  aspirin EC tablet 81 mg (81 mg Oral Given 03/30/21 1437)  atorvastatin (LIPITOR) tablet 80 mg (80 mg Oral Given 03/30/21 1437)  metoprolol succinate (TOPROL-XL) 24 hr tablet 12.5 mg (12.5 mg Oral Given 03/30/21 1437)  acetaminophen (TYLENOL) tablet 650 mg (has no administration in time range)  ondansetron (ZOFRAN) injection 4 mg (has no administration in time range)  nicotine (NICODERM CQ - dosed in mg/24 hours) patch 21 mg (21 mg Transdermal Patch Applied 03/30/21 1437)  aspirin chewable tablet 324 mg (324 mg Oral Given 03/30/21 1053)  morphine (PF) 4 MG/ML injection 4 mg (4 mg Intravenous Given 03/30/21 1432)  ondansetron (ZOFRAN) injection 4 mg (4 mg Intravenous Given 03/30/21 1431)  heparin bolus via infusion 4,000 Units (4,000 Units Intravenous Bolus from Bag 03/30/21 1430)     IMPRESSION / MDM / ASSESSMENT AND PLAN / ED COURSE  I reviewed the triage vital signs and the nursing notes.                               The patient is on the cardiac monitor to evaluate for evidence of  arrhythmia and/or significant heart rate changes.   Ddx:  ACS/NSTEMI, coronary vasospasm, unlikely PE, HTN urgency, unlikely dissection   MDM:  41 yo M with h/o LCx CAD based on Cath with Dr. Okey Dupre in 2019, here with chest pain. Pt has been unable to afford his plavix. On arrival here, EKG does show new significant TWI concerning for ischemia. VS otherwise stable. Pain is not sharp, tearing, or suggestive of dissection. No hypoxia, tachycardia, leg swelling or signs of PE. CXR reviewed and is unremarkable. Initial troponin 190. CBC with  mild reactive leukocytosis. BMP unremarkable, normal renal function. LFTs, Lipase normal. UDS +THC only.  Discussed the case with Dr. Azucena Cecil. Pt given ASA, will start on heparin and admit. This was discussed with pt and signiciant other who are in agreement. Pain improving.   MEDICATIONS GIVEN IN ED: Medications  nitroGLYCERIN (NITROSTAT) SL tablet 0.4 mg (has no administration in time range)  heparin ADULT infusion 100 units/mL (25000 units/225mL) (900 Units/hr Intravenous New Bag/Given 03/30/21 1430)  aspirin EC tablet 81 mg (81 mg Oral Given 03/30/21 1437)  atorvastatin (LIPITOR) tablet 80 mg (80 mg Oral Given 03/30/21 1437)  metoprolol succinate (TOPROL-XL) 24 hr tablet 12.5 mg (12.5 mg Oral Given 03/30/21 1437)  acetaminophen (TYLENOL) tablet 650 mg (has no administration in time range)  ondansetron (ZOFRAN) injection 4 mg (has no administration in time range)  nicotine (NICODERM CQ - dosed in mg/24 hours) patch 21 mg (21 mg Transdermal Patch Applied 03/30/21 1437)  aspirin chewable tablet 324 mg (324 mg Oral Given 03/30/21 1053)  morphine (PF) 4 MG/ML injection 4 mg (4 mg Intravenous Given 03/30/21 1432)  ondansetron (ZOFRAN) injection 4 mg (4 mg Intravenous Given 03/30/21 1431)  heparin bolus via infusion 4,000 Units (4,000 Units Intravenous Bolus from Bag 03/30/21 1430)     Consults:  Dr. Azucena Cecil, case discussed Dr. Joylene Igo, Hospitalist   EMR  reviewed  Cath notes from Dr. Okey Dupre 2019 Cardiology notes from clinic    FINAL CLINICAL IMPRESSION(S) / ED DIAGNOSES   Final diagnoses:  NSTEMI (non-ST elevated myocardial infarction) [facility])     Rx / DC Orders   ED Discharge Orders     None        Note:  This document was prepared using Dragon voice recognition software and may include unintentional dictation errors.   Shaune Pollack, MD 03/30/21 [phone]

## 2021-03-30 NOTE — H&P (Signed)
History and Physical    Patient: Dean Boyd ZOX:[id number] DOB: [birth year] DOA: 03/30/2021 DOS: the patient was seen and examined on 03/30/2021 PCP: Pcp, No  Patient coming from: Home  Chief Complaint:  Chief Complaint  Patient presents with   Chest Pain    HPI: Dean Boyd is a 41 y.o. male with medical history significant for coronary artery disease status post left heart cath in 2019 which showed occluded left circumflex with collaterals noted from RCA 1, polysubstance abuse who presents to the emergency room for evaluation of midsternal chest pain which he has had for over 24 hours associated with nausea, shortness of breath and diaphoresis.  He describes his pain as pressure-like with radiation down his arms and rated a 10 x 10 in intensity at its worst.  Pain feels similar to his prior heart attack. He has been noncompliant with prescribed meds. He denies having any fever, no chills, no dizziness, no lightheadedness, no headache, no urinary symptoms, no focal deficits of blurred vision, no changes in his bowel habits.   Review of Systems: As mentioned in the history of present illness. All other systems reviewed and are negative. Past Medical History:  Diagnosis Date   Alcohol abuse    Coronary artery disease    a. 05/2017 Late presenting STEMI/Cath: LM nl, LAD nl, D1/2/3 nl, RI min irregs, LCX 152m, distal vessel fills via R->L collats, OM1 min irregs, RCA/RPDA/RPAV/RPL1/2 min irregs.   Ischemic cardiomyopathy    a. 05/2017 Echo: EF 50-55%, mild asymmetric posterior hypertrophy. Sev basal-midinferolateral HK. Mild MR. Nl RV fxn. Nl RV fxn.   Marijuana abuse    Nausea and vomiting    Tobacco abuse    Past Surgical History:  Procedure Laterality Date   LEFT HEART CATH AND CORONARY ANGIOGRAPHY N/A 05/17/2017   Procedure: LEFT HEART CATH AND CORONARY ANGIOGRAPHY;  Surgeon: Iran Ouch, MD;  Location: [facility] INVASIVE CV LAB;  Service: Cardiovascular;  Laterality: N/A;    Social History:  reports that he has been smoking cigarettes. He has a 3.75 pack-year smoking history. He has never used smokeless tobacco. He reports current alcohol use. He reports current drug use. Drug: Marijuana.  No Known Allergies  Family History  Problem Relation Age of Onset   High blood pressure Mother        alive in her mid-50's   Heart failure Mother    Sleep apnea Father        alive in his mid-50's   Heart attack Maternal Grandmother    Diabetes Maternal Grandmother     Prior to Admission medications   Medication Sig Start Date End Date Taking? Authorizing Provider  atorvastatin (LIPITOR) 40 MG tablet Take 1 tablet (40 mg total) by mouth daily. 05/25/20 07/24/20  Dionne Bucy, MD  metoprolol succinate (TOPROL XL) 25 MG 24 hr tablet Take 0.5 tablets (12.5 mg total) by mouth daily. Patient not taking: Reported on 04/28/2020 05/23/17   End, Cristal Deer, MD  nitroGLYCERIN (NITROSTAT) 0.4 MG SL tablet Place 1 tablet (0.4 mg total) under the tongue every 5 (five) minutes as needed for chest pain. Maximum of 3 doses. 05/23/17 08/21/17  Yvonne Kendall, MD    Physical Exam: Vitals:   03/30/21 1200 03/30/21 1230 03/30/21 1250 03/30/21 1300  BP: 122/78 (!) 117/94  (!) 153/97  Pulse: 78 83  82  Resp: 18 20  14   Temp:      TempSrc:      SpO2: 93% 96%  97%  Weight:  73.5 kg   Height:   5\' 9"  (1.753 m)    Physical Exam Vitals and nursing note reviewed.  Constitutional:      General: He is in acute distress.     Appearance: He is well-developed.  HENT:     Head: Normocephalic and atraumatic.  Eyes:     Pupils: Pupils are equal, round, and reactive to light.  Cardiovascular:     Rate and Rhythm: Normal rate and regular rhythm.     Heart sounds: Normal heart sounds.  Pulmonary:     Effort: Pulmonary effort is normal.  Abdominal:     Tenderness: There is abdominal tenderness.     Comments: Diffusely tender  Musculoskeletal:        General: Normal range of  motion.     Cervical back: Normal range of motion and neck supple.  Skin:    General: Skin is warm and dry.  Neurological:     General: No focal deficit present.     Mental Status: He is alert.  Psychiatric:        Mood and Affect: Mood normal.        Behavior: Behavior normal.     Data Reviewed: Notes from primary care and specialist visits, past discharge summaries. Prior diagnostic testing as applicable to current admission diagnoses Updated medications and problem lists for reconciliation ED course, including vitals, labs, imaging, treatment and response to treatment Triage notes and ED providers notes Urine drug screen is positive for cannabinoids Troponin elevated at 190 Twelve-lead EKG reviewed by me shows sinus rhythm with LVH and inferolateral T wave inversion There are no new results to review at this time.  Assessment and Plan: Principal Problem:   NSTEMI (non-ST elevated myocardial infarction) [facility]) Active Problems:   Polysubstance abuse (HCC)   Coronary artery disease    Non-ST elevation MI In a patient with a known history of coronary artery disease Status post left heart cath in 2019 which showed severe one-vessel coronary artery disease with occluded mid left circumflex with collaterals noted from the right coronary artery.  No other obstructive disease.  Ostial RCA spasm resolved with intracoronary nitroglycerin Continue heparin drip initiated in the ER Continue low-dose beta-blockers as well as as needed nitrates Continue high intensity statin Obtain 2D echocardiogram to assess LVEF and rule out regional wall motion abnormality Consult cardiology     Polysubstance abuse Patient admits to marijuana and nicotine use We will place on nicotine transdermal patch 21 mg daily Advised to abstain from further illicit drug use     Medication noncompliance Will need referral to [facility] upon discharge Patient has been advised on the need to be compliant with  prescribed medication    Advance Care Planning:   Code Status: Full Code   Consults: Cardiology  Family Communication: Greater than 50% of time was spent discussing patient's condition and plan of care with him at the bedside.  All questions and concerns have been addressed.  He verbalizes understanding and agrees with the plan.  Severity of Illness: The appropriate patient status for this patient is INPATIENT. Inpatient status is judged to be reasonable and necessary in order to provide the required intensity of service to ensure the patient's safety. The patient's presenting symptoms, physical exam findings, and initial radiographic and laboratory data in the context of their chronic comorbidities is felt to place them at high risk for further clinical deterioration. Furthermore, it is not anticipated that the patient will be medically stable for discharge  from the hospital within 2 midnights of admission.   * I certify that at the point of admission it is my clinical judgment that the patient will require inpatient hospital care spanning beyond 2 midnights from the point of admission due to high intensity of service, high risk for further deterioration and high frequency of surveillance required.*  Author: Lucile Shutters, MD 03/30/2021 2:14 PM  For on call review [email or web address].

## 2021-03-30 NOTE — Progress Notes (Signed)
*  PRELIMINARY RESULTS* Echocardiogram 2D Echocardiogram has been performed.  Dean Boyd 03/30/2021, 2:03 PM

## 2021-03-31 ENCOUNTER — Encounter
Admission: EM | Disposition: A | Discharge status: Home / Self Care | Payer: Self-pay | Source: Home / Self Care | Attending: Internal Medicine

## 2021-03-31 DIAGNOSIS — I25110 Atherosclerotic heart disease of native coronary artery with unstable angina pectoris: Secondary | ICD-10-CM

## 2021-03-31 DIAGNOSIS — F1910 Other psychoactive substance abuse, uncomplicated: Secondary | ICD-10-CM

## 2021-03-31 DIAGNOSIS — R079 Chest pain, unspecified: Secondary | ICD-10-CM

## 2021-03-31 DIAGNOSIS — R778 Other specified abnormalities of plasma proteins: Secondary | ICD-10-CM

## 2021-03-31 HISTORY — PX: LEFT HEART CATH AND CORONARY ANGIOGRAPHY: CATH118249

## 2021-03-31 HISTORY — PX: CORONARY STENT INTERVENTION: CATH118234

## 2021-03-31 LAB — HEPARIN LEVEL (UNFRACTIONATED)
Heparin Unfractionated: 0.21 IU/mL — ABNORMAL LOW (ref 0.30–0.70)
Heparin Unfractionated: 0.51 IU/mL (ref 0.30–0.70)
Heparin Unfractionated: 0.48 IU/mL (ref 0.30–0.70)

## 2021-03-31 LAB — CBC
HCT: 43.7 % (ref 39.0–52.0)
Hemoglobin: 13.8 g/dL (ref 13.0–17.0)
MCH: 28.1 pg (ref 26.0–34.0)
MCHC: 31.6 g/dL (ref 30.0–36.0)
MCV: 89 fL (ref 80.0–100.0)
Platelets: 232 10*3/uL (ref 150–400)
RBC: 4.91 MIL/uL (ref 4.22–5.81)
RDW: 12.8 % (ref 11.5–15.5)
WBC: 11.5 10*3/uL — ABNORMAL HIGH (ref 4.0–10.5)
nRBC: 0 % (ref 0.0–0.2)

## 2021-03-31 LAB — BASIC METABOLIC PANEL
Anion gap: 5 (ref 5–15)
BUN: 13 mg/dL (ref 6–20)
CO2: 25 mmol/L (ref 22–32)
Calcium: 9.1 mg/dL (ref 8.9–10.3)
Chloride: 106 mmol/L (ref 98–111)
Creatinine, Ser: 1.12 mg/dL (ref 0.61–1.24)
GFR, Estimated: >60 mL/min (ref >60)
Glucose, Bld: 97 mg/dL (ref 70–99)
Potassium: 4 mmol/L (ref 3.5–5.1)
Sodium: 136 mmol/L (ref 135–145)

## 2021-03-31 LAB — LIPID PANEL
Cholesterol: 192 mg/dL (ref 0–200)
HDL: 61 mg/dL (ref >40)
LDL Cholesterol: 80 mg/dL (ref 0–99)
Total CHOL/HDL Ratio: 3.1 RATIO
Triglycerides: 255 mg/dL — ABNORMAL HIGH (ref <150)
VLDL: 51 mg/dL — ABNORMAL HIGH (ref 0–40)

## 2021-03-31 LAB — POCT ACTIVATED CLOTTING TIME: Activated Clotting Time: 323 seconds

## 2021-03-31 SURGERY — LEFT HEART CATH AND CORONARY ANGIOGRAPHY
Anesthesia: Moderate Sedation

## 2021-03-31 MED ORDER — HEPARIN SODIUM (PORCINE) 1000 UNIT/ML IJ SOLN
INTRAMUSCULAR | Status: AC
  Filled 2021-03-31: qty 10

## 2021-03-31 MED ORDER — VERAPAMIL HCL 2.5 MG/ML IV SOLN
INTRAVENOUS | Status: DC | PRN
  Administered 2021-03-31: 2.5 mg via INTRA_ARTERIAL

## 2021-03-31 MED ORDER — VERAPAMIL HCL 2.5 MG/ML IV SOLN
INTRAVENOUS | Status: AC
  Filled 2021-03-31: qty 2

## 2021-03-31 MED ORDER — ACETAMINOPHEN 325 MG PO TABS
650.0000 mg | ORAL_TABLET | ORAL | Status: DC | PRN
  Administered 2021-03-31: 650 mg via ORAL
  Filled 2021-03-31: qty 2

## 2021-03-31 MED ORDER — TICAGRELOR 90 MG PO TABS
90.0000 mg | ORAL_TABLET | Freq: Two times a day (BID) | ORAL | Status: DC
  Administered 2021-03-31 – 2021-04-01 (×2): 90 mg via ORAL
  Filled 2021-03-31 (×2): qty 1

## 2021-03-31 MED ORDER — TICAGRELOR 90 MG PO TABS
ORAL_TABLET | ORAL | Status: AC
  Filled 2021-03-31: qty 2

## 2021-03-31 MED ORDER — MIDAZOLAM HCL 2 MG/2ML IJ SOLN
INTRAMUSCULAR | Status: DC | PRN
Start: 2021-03-31 — End: 2021-03-31
  Administered 2021-03-31: 2 mg via INTRAVENOUS

## 2021-03-31 MED ORDER — HEPARIN BOLUS VIA INFUSION
1100.0000 [IU] | Freq: Once | INTRAVENOUS | Status: AC
Start: 2021-03-31 — End: 2021-03-31
  Administered 2021-03-31: 1100 [IU] via INTRAVENOUS
  Filled 2021-03-31: qty 1100

## 2021-03-31 MED ORDER — MIDAZOLAM HCL 2 MG/2ML IJ SOLN
INTRAMUSCULAR | Status: AC
  Filled 2021-03-31: qty 2

## 2021-03-31 MED ORDER — FENTANYL CITRATE (PF) 100 MCG/2ML IJ SOLN
INTRAMUSCULAR | Status: AC
  Filled 2021-03-31: qty 2

## 2021-03-31 MED ORDER — SODIUM CHLORIDE 0.9 % WEIGHT BASED INFUSION: 1.0000 mL/kg/h | INTRAVENOUS | Status: AC

## 2021-03-31 MED ORDER — IOHEXOL 300 MG/ML  SOLN
INTRAMUSCULAR | Status: DC | PRN
  Administered 2021-03-31: 95 mL

## 2021-03-31 MED ORDER — ONDANSETRON HCL 4 MG/2ML IJ SOLN: 4.0000 mg | Freq: Four times a day (QID) | INTRAMUSCULAR | Status: DC | PRN

## 2021-03-31 MED ORDER — SODIUM CHLORIDE 0.9 % IV SOLN: 250.0000 mL | INTRAVENOUS | Status: DC | PRN

## 2021-03-31 MED ORDER — TICAGRELOR 90 MG PO TABS
ORAL_TABLET | ORAL | Status: DC | PRN
Start: 2021-03-31 — End: 2021-03-31
  Administered 2021-03-31: 180 mg via ORAL

## 2021-03-31 MED ORDER — HEPARIN (PORCINE) IN NACL 1000-0.9 UT/500ML-% IV SOLN
INTRAVENOUS | Status: AC
  Filled 2021-03-31: qty 1000

## 2021-03-31 MED ORDER — HEPARIN (PORCINE) IN NACL 1000-0.9 UT/500ML-% IV SOLN
INTRAVENOUS | Status: DC | PRN
  Administered 2021-03-31: 1000 mL

## 2021-03-31 MED ORDER — SODIUM CHLORIDE 0.9% FLUSH: 3.0000 mL | INTRAVENOUS | Status: DC | PRN

## 2021-03-31 MED ORDER — HEPARIN SODIUM (PORCINE) 1000 UNIT/ML IJ SOLN
INTRAMUSCULAR | Status: DC | PRN
  Administered 2021-03-31 (×2): 4000 [IU] via INTRAVENOUS

## 2021-03-31 MED ORDER — FENTANYL CITRATE (PF) 100 MCG/2ML IJ SOLN
INTRAMUSCULAR | Status: DC | PRN
  Administered 2021-03-31: 50 ug via INTRAVENOUS

## 2021-03-31 MED ORDER — SODIUM CHLORIDE 0.9% FLUSH
3.0000 mL | Freq: Two times a day (BID) | INTRAVENOUS | Status: DC
  Administered 2021-04-01 (×2): 3 mL via INTRAVENOUS

## 2021-03-31 MED ORDER — LABETALOL HCL 5 MG/ML IV SOLN: 10.0000 mg | INTRAVENOUS | Status: AC | PRN

## 2021-03-31 MED ORDER — HYDRALAZINE HCL 20 MG/ML IJ SOLN: 10.0000 mg | INTRAMUSCULAR | Status: AC | PRN

## 2021-03-31 SURGICAL SUPPLY — 19 items
BALLN TREK RX 2.5X12 (BALLOONS) ×2
BALLN ~~LOC~~ TREK RX 3.25X12 (BALLOONS)
BALLOON TREK RX 2.5X12 (BALLOONS) IMPLANT
BALLOON ~~LOC~~ TREK RX 3.25X12 (BALLOONS) IMPLANT
CATH 5F 110X4 TIG (CATHETERS) ×1 IMPLANT
CATH VISTA GUIDE 6FR XBLAD3.5 (CATHETERS) ×1 IMPLANT
DEVICE RAD TR BAND REGULAR (VASCULAR PRODUCTS) ×1 IMPLANT
DRAPE BRACHIAL (DRAPES) ×1 IMPLANT
GLIDESHEATH SLEND SS 6F .021 (SHEATH) ×1 IMPLANT
GUIDEWIRE INQWIRE 1.5J.035X260 (WIRE) IMPLANT
INQWIRE 1.5J .035X260CM (WIRE) ×2
KIT ENCORE 26 ADVANTAGE (KITS) ×1 IMPLANT
PACK CARDIAC CATH (CUSTOM PROCEDURE TRAY) ×2 IMPLANT
PROTECTION STATION PRESSURIZED (MISCELLANEOUS) ×2
SET ATX SIMPLICITY (MISCELLANEOUS) ×1 IMPLANT
STATION PROTECTION PRESSURIZED (MISCELLANEOUS) IMPLANT
STENT ONYX FRONTIER 3.0X12 (Permanent Stent) ×1 IMPLANT
TUBING CIL FLEX 10 FLL-RA (TUBING) ×1 IMPLANT
WIRE ASAHI PROWATER 180CM (WIRE) ×1 IMPLANT

## 2021-03-31 NOTE — Progress Notes (Signed)
°  Progress Note   Patient: Dean Boyd MYT:[id number] DOB: [birth year] DOA: 03/30/2021     1 DOS: the patient was seen and examined on 03/31/2021   Brief hospital course: 41 year old male with a known history of CAD admitted for unstable angina and elevated troponins  2/16: S/p cardiac cath showing LCx-OM known CTO (collaterals are better) - culprit is 95% prox LAD just prior to D1.  DES PCI.       Assessment and Plan: * NSTEMI (non-ST elevated myocardial infarction) (HCC)- (present on admission) S/p cardiac cath - LCx-OM known CTO (collaterals are better) - culprit is 95% prox LAD just prior to D1.  DES PCI.  went well.  Pharmacy/TOC checking on cost of Brilinta (vs. convert to Plavix -- if convert, would need 300 mg load per cardiology. Continue aspirin, Lipitor, metoprolol and sublingual nitro  Coronary artery disease- (present on admission) S/p cardiac cath - LCx-OM known CTO (collaterals are better) - culprit is 95% prox LAD just prior to D1.  DES PCI.  went well.  Pharmacy/TOC checking on cost of Brilinta (vs. convert to Plavix -- if convert, would need 300 mg load per cardiology. Continue aspirin, Lipitor, metoprolol and sublingual nitro for now Patient is a self-pay so affordability will be an issue  Polysubstance abuse (HCC)- (present on admission) Counseled        Subjective: Seems agitated as he has not had anything to eat for last 14 hours.  Waiting for cardiac cath.  He realizes the importance of Considering his history of coronary disease  Physical Exam: Vitals:   03/31/21 1505 03/31/21 1519 03/31/21 1530 03/31/21 1545  BP: (!) 120/95 (!) 124/91 (!) 132/94 (!) 136/100  Pulse: 74 72 73 77  Resp: 12 11 14 18   Temp:      TempSrc:      SpO2: 100% 97% 98% 99%  Weight:      Height:       Constitutional:      General: He is in no acute distress    Appearance: He is well-developed.  HENT:     Head: Normocephalic and atraumatic.  Eyes:     Pupils: Pupils are  equal, round, and reactive to light.  Cardiovascular:     Rate and Rhythm: Normal rate and regular rhythm.     Heart sounds: Normal heart sounds.  Pulmonary:     Effort: Pulmonary effort is normal.  Abdominal:    Soft, Benign Musculoskeletal:        General: Normal range of motion.     Cervical back: Normal range of motion and neck supple.  Skin:    General: Skin is warm and dry.  Neurological:     General: No focal deficit present.     Mental Status: He is alert.  Psychiatric:        Mood and Affect: Mood  anxious    Behavior: Behavior agitated   Data Reviewed:  Elevated troponin.  Urine tox positive for cannabinoid  Family Communication: Spouse walked out when I entered the room and was not interested in talking  Disposition: Status is: Inpatient Remains inpatient appropriate because: S/p cardiac cath.  Will need overnight monitoring and decide medication management before discharge home tomorrow    DVT prophylaxis-SCDs       Planned Discharge Destination: Home     Time spent: 35 minutes  Author: , MD 03/31/2021 4:48 PM  For on call review [email or web address].

## 2021-03-31 NOTE — Assessment & Plan Note (Addendum)
S/p cardiac cath - LCx-OM known CTO (collaterals are better) - culprit is 95% prox LAD just prior to D1.  DES PCI.  went well.  Pharmacy/TOC checking on cost of Brilinta (vs. convert to Plavix -- if convert, would need 300 mg load per cardiology. Continue aspirin, Lipitor, metoprolol and sublingual nitro for now Patient is a self-pay so affordability will be an issue

## 2021-03-31 NOTE — Interval H&P Note (Signed)
History and Physical Interval Note:  03/31/2021 2:15 PM  Dean Boyd  has presented today for surgery, with the diagnosis of LT Heart Cath   Chest pain.  The various methods of treatment have been discussed with the patient and family. After consideration of risks, benefits and other options for treatment, the patient has consented to  Procedure(s): LEFT HEART CATH AND CORONARY ANGIOGRAPHY (N/A)  PERCUTANEOUS CORONARY INTERVENTION  as a surgical intervention.  The patient's history has been reviewed, patient examined, no change in status, stable for surgery.  I have reviewed the patient's chart and labs.  Questions were answered to the patient's satisfaction.     Cath Lab Visit (complete for each Cath Lab visit)  Clinical Evaluation Leading to the Procedure:   ACS: Yes.    Non-ACS:    Anginal Classification: CCS IV  Anti-ischemic medical therapy: Minimal Therapy (1 class of medications)  Non-Invasive Test Results: No non-invasive testing performed  Prior CABG: No previous CABG    Bryan Lemma

## 2021-03-31 NOTE — Consult Note (Signed)
ANTICOAGULATION CONSULT NOTE  Pharmacy Consult for heparin infusion Indication: chest pain/ACS  Patient Measurements: Height: 5\' 9"  (175.3 cm) Weight: 73.5 kg (162 lb) IBW/kg (Calculated) : 70.7 Heparin Dosing Weight: 73.5kg  Labs: Recent Labs    03/30/21 1055 03/30/21 1246 03/30/21 1608 03/30/21 2005 03/31/21 0140  HGB 14.4  --   --   --  13.8  HCT 45.6  --   --   --  43.7  PLT 248  --   --   --  232  APTT 28  --   --   --   --   HEPARINUNFRC  --   --   --  0.32 0.21*  CREATININE 0.98  --   --   --  1.12  TROPONINIHS 190* 190* 197*  --   --      Estimated Creatinine Clearance: 87.7 mL/min (by C-G formula based on SCr of 1.12 mg/dL).   Medical History: Past Medical History:  Diagnosis Date   Alcohol abuse    Coronary artery disease    a. 05/2017 Late presenting STEMI/Cath: LM nl, LAD nl, D1/2/3 nl, RI min irregs, LCX 114m, distal vessel fills via R->L collats, OM1 min irregs, RCA/RPDA/RPAV/RPL1/2 min irregs.   Ischemic cardiomyopathy    a. 05/2017 Echo: EF 50-55%, mild asymmetric posterior hypertrophy. Sev basal-midinferolateral HK. Mild MR. Nl RV fxn. Nl RV fxn.   Marijuana abuse    Nausea and vomiting    Tobacco abuse     Medications:  PTA:N/A Inpatient: Heparin drip (2/15 >>> )  Allergies: No AC/APT related allergies  Assessment: 41 y.o. male with a hx of CAD, HTN, noncompliance, leaving AMA, polysubstance abuse who is being seen for possible NSTEMI. Pharmacy consulted for heparin drip in the setting of ACS/Chest pain.    Date Time HL Rate/Comment 2/15 2005 0.32 900 un/hr / thera x 1       Baseline Labs: aPTT - 28 Hgb - 14.4 Plts - 248  Goal of Therapy:  Heparin level 0.3-0.7 units/ml Monitor platelets by anticoagulation protocol: Yes  Plan:  2/16:  HL @ 0140 = 0.21, SUBtherapeutic  Will order heparin 1100 units IV X 1 bolus and increase drip rate to 1050 units/hr.  Will recheck HL 6 hrs afte rate change.   Maebelle Sulton D 03/31/2021 2:18  AM

## 2021-03-31 NOTE — Assessment & Plan Note (Signed)
S/p cardiac cath - LCx-OM known CTO (collaterals are better) - culprit is 95% prox LAD just prior to D1.  DES PCI.  went well.  Pharmacy/TOC checking on cost of Brilinta (vs. convert to Plavix -- if convert, would need 300 mg load per cardiology. Continue aspirin, Lipitor, metoprolol and sublingual nitro

## 2021-03-31 NOTE — Progress Notes (Signed)
Progress Note  Patient Name: Dean Boyd Date of Encounter: 03/31/2021  [facility] Cardiologist: Yvonne Kendallhristopher End, MD   Subjective   Seen on rounds this morning, complaining of discomfort right IV Some chest discomfort on and off Scheduled for cardiac catheterization  Inpatient Medications    Scheduled Meds:  aspirin EC  81 mg Oral Daily   atorvastatin  80 mg Oral Daily   influenza vac split quadrivalent PF  0.5 mL Intramuscular Tomorrow-1000   metoprolol succinate  12.5 mg Oral Daily   nicotine  21 mg Transdermal Daily   pantoprazole  40 mg Oral Daily   sodium chloride flush  3 mL Intravenous Q12H   Continuous Infusions:  sodium chloride     sodium chloride 1 mL/kg/hr (03/31/21 0500)   heparin 1,050 Units/hr (03/31/21 0228)   PRN Meds: sodium chloride, acetaminophen, alum & mag hydroxide-simeth, nitroGLYCERIN, ondansetron (ZOFRAN) IV, sodium chloride flush   Vital Signs    Vitals:   03/31/21 0405 03/31/21 0802 03/31/21 0954 03/31/21 1122  BP: 106/77 94/67 117/83 108/77  Pulse: 76 72 71 76  Resp: 20  16 19   Temp: 97.9 F (36.6 C)  (!) 97.4 F (36.3 C) 97.6 F (36.4 C)  TempSrc:   Oral Oral  SpO2: 97% 98% 99% 98%  Weight:      Height:        Intake/Output Summary (Last 24 hours) at 03/31/2021 1157 Last data filed at 03/31/2021 1123 Gross per 24 hour  Intake 431.93 ml  Output --  Net 431.93 ml   Last 3 Weights 03/30/2021 03/30/2021 05/25/2020  Weight (lbs) 161 lb 9.6 oz 162 lb 160 lb  Weight (kg) 73.3 kg 73.483 kg 72.576 kg      Telemetry    Normal sinus rhythm- Personally Reviewed  ECG    - Personally Reviewed  Physical Exam   GEN: No acute distress.   Neck: No JVD Cardiac: RRR, no murmurs, rubs, or gallops.  Respiratory: Clear to auscultation bilaterally. GI: Soft, nontender, non-distended  MS: No edema; No deformity. Neuro:  Nonfocal  Psych: Normal affect   Labs    High Sensitivity Troponin:   Recent Labs  Lab 03/30/21 1055  03/30/21 1246 03/30/21 1608  TROPONINIHS 190* 190* 197*     Chemistry Recent Labs  Lab 03/30/21 1055 03/31/21 0140  NA 136 136  K 3.9 4.0  CL 106 106  CO2 24 25  GLUCOSE 109* 97  BUN 11 13  CREATININE 0.98 1.12  CALCIUM 9.0 9.1  PROT 7.7  --   ALBUMIN 4.1  --   AST 20  --   ALT 18  --   ALKPHOS 52  --   BILITOT 0.8  --   GFRNONAA >60 >60  ANIONGAP 6 5    Lipids  Recent Labs  Lab 03/31/21 0140  CHOL 192  TRIG 255*  HDL 61  LDLCALC 80  CHOLHDL 3.1    Hematology Recent Labs  Lab 03/30/21 1055 03/31/21 0140  WBC 12.1* 11.5*  RBC 5.06 4.91  HGB 14.4 13.8  HCT 45.6 43.7  MCV 90.1 89.0  MCH 28.5 28.1  MCHC 31.6 31.6  RDW 12.9 12.8  PLT 248 232   Thyroid No results for input(s): TSH, FREET4 in the last 168 hours.  BNPNo results for input(s): BNP, PROBNP in the last 168 hours.  DDimer No results for input(s): DDIMER in the last 168 hours.   Radiology    DG Chest 2 View  Result Date:  03/30/2021 CLINICAL DATA:  Chest pain EXAM: CHEST - 2 VIEW COMPARISON:  Chest x-ray 05/25/2020 FINDINGS: Heart size and mediastinal contours are within normal limits. No suspicious pulmonary opacities identified. No pleural effusion or pneumothorax visualized. No acute osseous abnormality appreciated. IMPRESSION: No acute intrathoracic process identified. Electronically Signed   By: Jannifer Hick M.D.   On: 03/30/2021 10:17   ECHOCARDIOGRAM COMPLETE  Result Date: 03/30/2021    ECHOCARDIOGRAM REPORT   Patient Name:   Dean Boyd Date of Exam: 03/30/2021 Medical Rec #:  [id number]       Height:       69.0 in Accession #:    [id number]      Weight:       162.0 lb Date of Birth:  1980-08-23       BSA:          1.889 m Patient Age:    41 years        BP:           153/97 mmHg Patient Gender: M               HR:           78 bpm. Exam Location:  [facility] Procedure: 2D Echo, Cardiac Doppler, Color Doppler and Intracardiac            Opacification Agent Indications:     RO7.9 chest pain   History:         Patient has prior history of Echocardiogram examinations. ICM,                  CAD; Risk Factors:Current Smoker. Drug and alcohol use.  Sonographer:     Humphrey Rolls Referring Phys:  [id number] Debbe Odea Diagnosing Phys: Debbe Odea MD IMPRESSIONS  1. Left ventricular ejection fraction, by estimation, is 50%. The left ventricle has low normal function. The left ventricle has no regional wall motion abnormalities. Left ventricular diastolic parameters were normal.  2. Right ventricular systolic function is normal. The right ventricular size is normal.  3. The mitral valve is normal in structure. No evidence of mitral valve regurgitation.  4. The aortic valve is grossly normal. Aortic valve regurgitation is not visualized.  5. The inferior vena cava is normal in size with greater than 50% respiratory variability, suggesting right atrial pressure of 3 mmHg. FINDINGS  Left Ventricle: Left ventricular ejection fraction, by estimation, is 50%. The left ventricle has low normal function. The left ventricle has no regional wall motion abnormalities. Definity contrast agent was given IV to delineate the left ventricular endocardial borders. The left ventricular internal cavity size was normal in size. There is no left ventricular hypertrophy. Left ventricular diastolic parameters were normal. Right Ventricle: The right ventricular size is normal. No increase in right ventricular wall thickness. Right ventricular systolic function is normal. Left Atrium: Left atrial size was normal in size. Right Atrium: Right atrial size was normal in size. Pericardium: There is no evidence of pericardial effusion. Mitral Valve: The mitral valve is normal in structure. No evidence of mitral valve regurgitation. MV peak gradient, 3.0 mmHg. The mean mitral valve gradient is 1.0 mmHg. Tricuspid Valve: The tricuspid valve is grossly normal. Tricuspid valve regurgitation is not demonstrated. Aortic Valve: The aortic  valve is grossly normal. Aortic valve regurgitation is not visualized. Aortic valve mean gradient measures 3.0 mmHg. Aortic valve peak gradient measures 6.0 mmHg. Aortic valve area, by VTI measures 2.94 cm. Pulmonic Valve: The pulmonic valve was not well  visualized. Pulmonic valve regurgitation is not visualized. Aorta: The aortic root and ascending aorta are structurally normal, with no evidence of dilitation. Venous: The inferior vena cava is normal in size with greater than 50% respiratory variability, suggesting right atrial pressure of 3 mmHg. IAS/Shunts: No atrial level shunt detected by color flow Doppler.  LEFT VENTRICLE PLAX 2D LVIDd:         4.78 cm      Diastology LVIDs:         4.33 cm      LV e' medial:    7.62 cm/s LV PW:         1.10 cm      LV E/e' medial:  8.7 LV IVS:        0.76 cm      LV e' lateral:   9.14 cm/s LVOT diam:     2.00 cm      LV E/e' lateral: 7.2 LV SV:         56 LV SV Index:   29 LVOT Area:     3.14 cm  LV Volumes (MOD) LV vol d, MOD A2C: 104.0 ml LV vol d, MOD A4C: 100.0 ml LV vol s, MOD A2C: 58.7 ml LV vol s, MOD A4C: 53.8 ml LV SV MOD A2C:     45.3 ml LV SV MOD A4C:     100.0 ml LV SV MOD BP:      47.2 ml RIGHT VENTRICLE RV Basal diam:  2.86 cm LEFT ATRIUM             Index        RIGHT ATRIUM           Index LA diam:        2.40 cm 1.27 cm/m   RA Area:     11.80 cm LA Vol (A2C):   46.9 ml 24.83 ml/m  RA Volume:   26.80 ml  14.19 ml/m LA Vol (A4C):   32.9 ml 17.42 ml/m LA Biplane Vol: 39.2 ml 20.75 ml/m  AORTIC VALVE                    PULMONIC VALVE AV Area (Vmax):    2.52 cm     PV Vmax:       1.02 m/s AV Area (Vmean):   2.73 cm     PV Vmean:      73.400 cm/s AV Area (VTI):     2.94 cm     PV VTI:        0.172 m AV Vmax:           122.00 cm/s  PV Peak grad:  4.2 mmHg AV Vmean:          77.700 cm/s  PV Mean grad:  2.0 mmHg AV VTI:            0.189 m AV Peak Grad:      6.0 mmHg AV Mean Grad:      3.0 mmHg LVOT Vmax:         97.90 cm/s LVOT Vmean:        67.600 cm/s  LVOT VTI:          0.177 m LVOT/AV VTI ratio: 0.94  AORTA Ao Root diam: 3.30 cm MITRAL VALVE MV Area (PHT): 5.42 cm    SHUNTS MV Area VTI:   3.23 cm    Systemic VTI:  0.18 m MV Peak grad:  3.0 mmHg  Systemic Diam: 2.00 cm MV Mean grad:  1.0 mmHg MV Vmax:       0.87 m/s MV Vmean:      55.9 cm/s MV Decel Time: 140 msec MV E velocity: 66.00 cm/s MV A velocity: 75.00 cm/s MV E/A ratio:  0.88 Debbe Odea MD Electronically signed by Debbe Odea MD Signature Date/Time: 03/30/2021/2:37:25 PM    Final     Cardiac Studies   Echocardiogram  1. Left ventricular ejection fraction, by estimation, is 50%. The left  ventricle has low normal function. The left ventricle has no regional wall  motion abnormalities. Left ventricular diastolic parameters were normal.   2. Right ventricular systolic function is normal. The right ventricular  size is normal.   3. The mitral valve is normal in structure. No evidence of mitral valve  regurgitation.   4. The aortic valve is grossly normal. Aortic valve regurgitation is not  visualized.   5. The inferior vena cava is normal in size with greater than 50%  respiratory variability, suggesting right atrial pressure of 3 mmHg.   Patient Profile     Dean Boyd is a 41 y.o. male with a hx of CAD, HTN, HLD, noncompliance, leaving AMA, polysubstance abuse who is being seen 03/30/2021 for the evaluation of NSTEMI   Assessment & Plan    Elevated troponin Prior history CAD  History of STEMI in 2019 with known 100% Lcx occlusion Unable to exclude non-STEMI Troponin essentially nontrending Continues to have stuttering chest pain, -Catheterization arranged by rounding team yesterday I have reviewed the risks, indications, and alternatives to cardiac catheterization, possible angioplasty, and stenting with the patient. Risks include but are not limited to bleeding, infection, vascular injury, stroke, myocardial infection, arrhythmia, kidney injury,  radiation-related injury in the case of prolonged fluoroscopy use, emergency cardiac surgery, and death. The patient understands the risks of serious complication is 1-2 in 1000 with diagnostic cardiac cath and 1-2% or less with angioplasty/stenting.  -Given stuttering chest pain overnight and this morning will keep n.p.o. for catheterization Continue heparin infusion for now -Aspirin 81mg  daily, statin, BB   HTN Was not taking medications as outpatient Current blood pressure well controlled On metoprolol succinate 12.5 daily   Polysubstance abuse -Positive for cannabinoid   Tobacco use - smokes 1/2 ppd Smoking cessation recommended   Total encounter time more than 40 minutes  Greater than 50% was spent in counseling and coordination of care with the patient   For questions or updates, please contact [facility] Please consult [email or web address] for contact info under        Signed, Julien Nordmann, MD  03/31/2021, 11:57 AM

## 2021-03-31 NOTE — H&P (View-Only) (Signed)
° °Progress Note ° °Patient Name: Dean Boyd °Date of Encounter: 03/31/2021 ° °[facility] Cardiologist: Christopher End, MD  ° °Subjective  ° °Seen on rounds this morning, complaining of discomfort right IV °Some chest discomfort on and off °Scheduled for cardiac catheterization ° °Inpatient Medications  °  °Scheduled Meds: ° aspirin EC  81 mg Oral Daily  ° atorvastatin  80 mg Oral Daily  ° influenza vac split quadrivalent PF  0.5 mL Intramuscular Tomorrow-1000  ° metoprolol succinate  12.5 mg Oral Daily  ° nicotine  21 mg Transdermal Daily  ° pantoprazole  40 mg Oral Daily  ° sodium chloride flush  3 mL Intravenous Q12H  ° °Continuous Infusions: ° sodium chloride    ° sodium chloride 1 mL/kg/hr (03/31/21 0500)  ° heparin 1,050 Units/hr (03/31/21 0228)  ° °PRN Meds: °sodium chloride, acetaminophen, alum & mag hydroxide-simeth, nitroGLYCERIN, ondansetron (ZOFRAN) IV, sodium chloride flush  ° °Vital Signs  °  °Vitals:  ° 03/31/21 0405 03/31/21 0802 03/31/21 0954 03/31/21 1122  °BP: 106/77 94/67 117/83 108/77  °Pulse: 76 72 71 76  °Resp: 20  16 19  °Temp: 97.9 °F (36.6 °C)  (!) 97.4 °F (36.3 °C) 97.6 °F (36.4 °C)  °TempSrc:   Oral Oral  °SpO2: 97% 98% 99% 98%  °Weight:      °Height:      ° ° °Intake/Output Summary (Last 24 hours) at 03/31/2021 1157 °Last data filed at 03/31/2021 1123 °Gross per 24 hour  °Intake 431.93 ml  °Output --  °Net 431.93 ml  ° °Last 3 Weights 03/30/2021 03/30/2021 05/25/2020  °Weight (lbs) 161 lb 9.6 oz 162 lb 160 lb  °Weight (kg) 73.3 kg 73.483 kg 72.576 kg  °   ° °Telemetry  °  °Normal sinus rhythm- Personally Reviewed ° °ECG  °  °- Personally Reviewed ° °Physical Exam  ° °GEN: No acute distress.   °Neck: No JVD °Cardiac: RRR, no murmurs, rubs, or gallops.  °Respiratory: Clear to auscultation bilaterally. °GI: Soft, nontender, non-distended  °MS: No edema; No deformity. °Neuro:  Nonfocal  °Psych: Normal affect  ° °Labs  °  °High Sensitivity Troponin:   °Recent Labs  °Lab 03/30/21 °1055  03/30/21 °1246 03/30/21 °1608  °TROPONINIHS 190* 190* 197*  °   °Chemistry °Recent Labs  °Lab 03/30/21 °1055 03/31/21 °0140  °NA 136 136  °K 3.9 4.0  °CL 106 106  °CO2 24 25  °GLUCOSE 109* 97  °BUN 11 13  °CREATININE 0.98 1.12  °CALCIUM 9.0 9.1  °PROT 7.7  --   °ALBUMIN 4.1  --   °AST 20  --   °ALT 18  --   °ALKPHOS 52  --   °BILITOT 0.8  --   °GFRNONAA >60 >60  °ANIONGAP 6 5  °  °Lipids  °Recent Labs  °Lab 03/31/21 °0140  °CHOL 192  °TRIG 255*  °HDL 61  °LDLCALC 80  °CHOLHDL 3.1  °  °Hematology °Recent Labs  °Lab 03/30/21 °1055 03/31/21 °0140  °WBC 12.1* 11.5*  °RBC 5.06 4.91  °HGB 14.4 13.8  °HCT 45.6 43.7  °MCV 90.1 89.0  °MCH 28.5 28.1  °MCHC 31.6 31.6  °RDW 12.9 12.8  °PLT 248 232  ° °Thyroid No results for input(s): TSH, FREET4 in the last 168 hours.  °BNPNo results for input(s): BNP, PROBNP in the last 168 hours.  °DDimer No results for input(s): DDIMER in the last 168 hours.  ° °Radiology  °  °DG Chest 2 View ° °Result Date:   03/30/2021 CLINICAL DATA:  Chest pain EXAM: CHEST - 2 VIEW COMPARISON:  Chest x-ray 05/25/2020 FINDINGS: Heart size and mediastinal contours are within normal limits. No suspicious pulmonary opacities identified. No pleural effusion or pneumothorax visualized. No acute osseous abnormality appreciated. IMPRESSION: No acute intrathoracic process identified. Electronically Signed   By: Jannifer Hick M.D.   On: 03/30/2021 10:17   ECHOCARDIOGRAM COMPLETE  Result Date: 03/30/2021    ECHOCARDIOGRAM REPORT   Patient Name:   Dean Boyd Date of Exam: 03/30/2021 Medical Rec #:  [id number]       Height:       69.0 in Accession #:    [id number]      Weight:       162.0 lb Date of Birth:  1980-08-23       BSA:          1.889 m Patient Age:    41 years        BP:           153/97 mmHg Patient Gender: M               HR:           78 bpm. Exam Location:  [facility] Procedure: 2D Echo, Cardiac Doppler, Color Doppler and Intracardiac            Opacification Agent Indications:     RO7.9 chest pain   History:         Patient has prior history of Echocardiogram examinations. ICM,                  CAD; Risk Factors:Current Smoker. Drug and alcohol use.  Sonographer:     Humphrey Rolls Referring Phys:  [id number] Debbe Odea Diagnosing Phys: Debbe Odea MD IMPRESSIONS  1. Left ventricular ejection fraction, by estimation, is 50%. The left ventricle has low normal function. The left ventricle has no regional wall motion abnormalities. Left ventricular diastolic parameters were normal.  2. Right ventricular systolic function is normal. The right ventricular size is normal.  3. The mitral valve is normal in structure. No evidence of mitral valve regurgitation.  4. The aortic valve is grossly normal. Aortic valve regurgitation is not visualized.  5. The inferior vena cava is normal in size with greater than 50% respiratory variability, suggesting right atrial pressure of 3 mmHg. FINDINGS  Left Ventricle: Left ventricular ejection fraction, by estimation, is 50%. The left ventricle has low normal function. The left ventricle has no regional wall motion abnormalities. Definity contrast agent was given IV to delineate the left ventricular endocardial borders. The left ventricular internal cavity size was normal in size. There is no left ventricular hypertrophy. Left ventricular diastolic parameters were normal. Right Ventricle: The right ventricular size is normal. No increase in right ventricular wall thickness. Right ventricular systolic function is normal. Left Atrium: Left atrial size was normal in size. Right Atrium: Right atrial size was normal in size. Pericardium: There is no evidence of pericardial effusion. Mitral Valve: The mitral valve is normal in structure. No evidence of mitral valve regurgitation. MV peak gradient, 3.0 mmHg. The mean mitral valve gradient is 1.0 mmHg. Tricuspid Valve: The tricuspid valve is grossly normal. Tricuspid valve regurgitation is not demonstrated. Aortic Valve: The aortic  valve is grossly normal. Aortic valve regurgitation is not visualized. Aortic valve mean gradient measures 3.0 mmHg. Aortic valve peak gradient measures 6.0 mmHg. Aortic valve area, by VTI measures 2.94 cm. Pulmonic Valve: The pulmonic valve was not well  visualized. Pulmonic valve regurgitation is not visualized. Aorta: The aortic root and ascending aorta are structurally normal, with no evidence of dilitation. Venous: The inferior vena cava is normal in size with greater than 50% respiratory variability, suggesting right atrial pressure of 3 mmHg. IAS/Shunts: No atrial level shunt detected by color flow Doppler.  LEFT VENTRICLE PLAX 2D LVIDd:         4.78 cm      Diastology LVIDs:         4.33 cm      LV e' medial:    7.62 cm/s LV PW:         1.10 cm      LV E/e' medial:  8.7 LV IVS:        0.76 cm      LV e' lateral:   9.14 cm/s LVOT diam:     2.00 cm      LV E/e' lateral: 7.2 LV SV:         56 LV SV Index:   29 LVOT Area:     3.14 cm  LV Volumes (MOD) LV vol d, MOD A2C: 104.0 ml LV vol d, MOD A4C: 100.0 ml LV vol s, MOD A2C: 58.7 ml LV vol s, MOD A4C: 53.8 ml LV SV MOD A2C:     45.3 ml LV SV MOD A4C:     100.0 ml LV SV MOD BP:      47.2 ml RIGHT VENTRICLE RV Basal diam:  2.86 cm LEFT ATRIUM             Index        RIGHT ATRIUM           Index LA diam:        2.40 cm 1.27 cm/m   RA Area:     11.80 cm LA Vol (A2C):   46.9 ml 24.83 ml/m  RA Volume:   26.80 ml  14.19 ml/m LA Vol (A4C):   32.9 ml 17.42 ml/m LA Biplane Vol: 39.2 ml 20.75 ml/m  AORTIC VALVE                    PULMONIC VALVE AV Area (Vmax):    2.52 cm     PV Vmax:       1.02 m/s AV Area (Vmean):   2.73 cm     PV Vmean:      73.400 cm/s AV Area (VTI):     2.94 cm     PV VTI:        0.172 m AV Vmax:           122.00 cm/s  PV Peak grad:  4.2 mmHg AV Vmean:          77.700 cm/s  PV Mean grad:  2.0 mmHg AV VTI:            0.189 m AV Peak Grad:      6.0 mmHg AV Mean Grad:      3.0 mmHg LVOT Vmax:         97.90 cm/s LVOT Vmean:        67.600 cm/s  LVOT VTI:          0.177 m LVOT/AV VTI ratio: 0.94  AORTA Ao Root diam: 3.30 cm MITRAL VALVE MV Area (PHT): 5.42 cm    SHUNTS MV Area VTI:   3.23 cm    Systemic VTI:  0.18 m MV Peak grad:  3.0 mmHg  Systemic Diam: 2.00 cm MV Mean grad:  1.0 mmHg MV Vmax:       0.87 m/s MV Vmean:      55.9 cm/s MV Decel Time: 140 msec MV E velocity: 66.00 cm/s MV A velocity: 75.00 cm/s MV E/A ratio:  0.88 Brian Agbor-Etang MD Electronically signed by Brian Agbor-Etang MD Signature Date/Time: 03/30/2021/2:37:25 PM    Final    ° °Cardiac Studies  ° °Echocardiogram ° 1. Left ventricular ejection fraction, by estimation, is 50%. The left  °ventricle has low normal function. The left ventricle has no regional wall  °motion abnormalities. Left ventricular diastolic parameters were normal.  ° 2. Right ventricular systolic function is normal. The right ventricular  °size is normal.  ° 3. The mitral valve is normal in structure. No evidence of mitral valve  °regurgitation.  ° 4. The aortic valve is grossly normal. Aortic valve regurgitation is not  °visualized.  ° 5. The inferior vena cava is normal in size with greater than 50%  °respiratory variability, suggesting right atrial pressure of 3 mmHg.  ° °Patient Profile  °   °Dean Boyd is a 41 y.o. male with a hx of CAD, HTN, HLD, noncompliance, leaving AMA, polysubstance abuse who is being seen 03/30/2021 for the evaluation of NSTEMI  ° °Assessment & Plan  °  °Elevated troponin °Prior history CAD  °History of STEMI in 2019 with known 100% Lcx occlusion °Unable to exclude non-STEMI °Troponin essentially nontrending °Continues to have stuttering chest pain, °-Catheterization arranged by rounding team yesterday °I have reviewed the risks, indications, and alternatives to cardiac catheterization, possible angioplasty, and stenting with the patient. Risks include but are not limited to bleeding, infection, vascular injury, stroke, myocardial infection, arrhythmia, kidney injury,  radiation-related injury in the case of prolonged fluoroscopy use, emergency cardiac surgery, and death. The patient understands the risks of serious complication is 1-2 in 1000 with diagnostic cardiac cath and 1-2% or less with angioplasty/stenting.  °-Given stuttering chest pain overnight and this morning will keep n.p.o. for catheterization °Continue heparin infusion for now °-Aspirin 81mg daily, statin, BB °  °HTN °Was not taking medications as outpatient °Current blood pressure well controlled °On metoprolol succinate 12.5 daily °  °Polysubstance abuse °-Positive for cannabinoid °  °Tobacco use °- smokes 1/2 ppd °Smoking cessation recommended ° ° Total encounter time more than 40 minutes ° Greater than 50% was spent in counseling and coordination of care with the patient ° ° °For questions or updates, please contact [facility] °Please consult [email or web address] for contact info under  ° °  °   °Signed, °Jaanvi Fizer, MD  °03/31/2021, 11:57 AM   ° °

## 2021-03-31 NOTE — Consult Note (Signed)
ANTICOAGULATION CONSULT NOTE  Pharmacy Consult for heparin infusion Indication: chest pain/ACS  Patient Measurements: Height: 5\' 9"  (175.3 cm) Weight: 73.3 kg (161 lb 9.6 oz) IBW/kg (Calculated) : 70.7 Heparin Dosing Weight: 73.5kg  Labs: Recent Labs    03/30/21 1055 03/30/21 1246 03/30/21 1608 03/30/21 2005 03/31/21 0140 03/31/21 0450 03/31/21 1018  HGB 14.4  --   --   --  13.8  --   --   HCT 45.6  --   --   --  43.7  --   --   PLT 248  --   --   --  232  --   --   APTT 28  --   --   --   --   --   --   HEPARINUNFRC  --   --   --    < > 0.21* 0.51 0.48  CREATININE 0.98  --   --   --  1.12  --   --   TROPONINIHS 190* 190* 197*  --   --   --   --    < > = values in this interval not displayed.     Estimated Creatinine Clearance: 87.7 mL/min (by C-G formula based on SCr of 1.12 mg/dL).   Medical History: Past Medical History:  Diagnosis Date   Alcohol abuse    Coronary artery disease    a. 05/2017 Late presenting STEMI/Cath: LM nl, LAD nl, D1/2/3 nl, RI min irregs, LCX 155m, distal vessel fills via R->L collats, OM1 min irregs, RCA/RPDA/RPAV/RPL1/2 min irregs.   Ischemic cardiomyopathy    a. 05/2017 Echo: EF 50-55%, mild asymmetric posterior hypertrophy. Sev basal-midinferolateral HK. Mild MR. Nl RV fxn. Nl RV fxn.   Marijuana abuse    Nausea and vomiting    Tobacco abuse     Medications:  PTA:N/A Inpatient: Heparin drip (2/15 >>> )  Allergies: No AC/APT related allergies  Assessment: 41 y.o. male with a hx of CAD, HTN, noncompliance, leaving AMA, polysubstance abuse who is being seen for possible NSTEMI. Pharmacy consulted for heparin drip in the setting of ACS/Chest pain.    Date Time HL Rate/Comment 2/15 2005 0.32 900 un/hr / thera x 1  2/16 0140  0.21 SUBtherapeutic; 900 > 1050 un/hr 2/16 0450 0.51 (Drawn 4 hours early after a rate change; recollected below) 2/16 1018 0.48 Therapeutic x1; 1050 un/hr       Baseline Labs: aPTT - 28 Hgb - 14.4 Plts -  248  Goal of Therapy:  Heparin level 0.3-0.7 units/ml Monitor platelets by anticoagulation protocol: Yes  Plan:  HL therapeutic x1. Pt is planning LHC today. Continue heparin drip rate at 1050 units/hr.  Recheck HL in 6 hrs and at least daily with AM labs once consecutively therapeutic. CTM CBC daily while on hep gtt.   3/16, PharmD, [facility] Clinical Pharmacist 03/31/2021 11:37 AM

## 2021-03-31 NOTE — Progress Notes (Signed)
Pt complained of chest discomfort, 5/10 pain scale. It happened after he went to the bathroom and he picked up a tissue off the floor. Vital signs are stable. PRN Nitroglycerin 0.4mg  SL given. NP Steward Drone made aware. After 5 mins, pt expressed relief but still feel a little pressure. Another dose of nitro given. Pt denies difficulty of breathing or palpitations.

## 2021-03-31 NOTE — Hospital Course (Signed)
41 year old male with a known history of CAD admitted for unstable angina and elevated troponins  2/16: S/p cardiac cath showing LCx-OM known CTO (collaterals are better) - culprit is 95% prox LAD just prior to D1.  DES PCI.

## 2021-03-31 NOTE — Assessment & Plan Note (Signed)
Counseled

## 2021-03-31 NOTE — Progress Notes (Signed)
Reassessed pt after giving 2nd dose of nitroglycerin, pt reported no more pressure. Will continue to monitor.

## 2021-03-31 NOTE — OR Nursing (Signed)
1625 pt ambulated to bathroom for void and bm.

## 2021-04-01 ENCOUNTER — Encounter: Payer: Self-pay | Admitting: Cardiology

## 2021-04-01 ENCOUNTER — Other Ambulatory Visit: Payer: Self-pay

## 2021-04-01 LAB — BASIC METABOLIC PANEL
Anion gap: 8 (ref 5–15)
BUN: 11 mg/dL (ref 6–20)
CO2: 23 mmol/L (ref 22–32)
Calcium: 9.1 mg/dL (ref 8.9–10.3)
Chloride: 107 mmol/L (ref 98–111)
Creatinine, Ser: 1.11 mg/dL (ref 0.61–1.24)
GFR, Estimated: >60 mL/min (ref >60)
Glucose, Bld: 107 mg/dL — ABNORMAL HIGH (ref 70–99)
Potassium: 3.7 mmol/L (ref 3.5–5.1)
Sodium: 138 mmol/L (ref 135–145)

## 2021-04-01 LAB — CBC
HCT: 44.9 % (ref 39.0–52.0)
Hemoglobin: 14 g/dL (ref 13.0–17.0)
MCH: 27.7 pg (ref 26.0–34.0)
MCHC: 31.2 g/dL (ref 30.0–36.0)
MCV: 88.7 fL (ref 80.0–100.0)
Platelets: 239 10*3/uL (ref 150–400)
RBC: 5.06 MIL/uL (ref 4.22–5.81)
RDW: 12.7 % (ref 11.5–15.5)
WBC: 10.8 10*3/uL — ABNORMAL HIGH (ref 4.0–10.5)
nRBC: 0 % (ref 0.0–0.2)

## 2021-04-01 MED ORDER — ATORVASTATIN CALCIUM 40 MG PO TABS
40.0000 mg | ORAL_TABLET | Freq: Every day | ORAL | 1 refills | Status: DC
  Filled 2021-04-01: qty 30, 30d supply, fill #0

## 2021-04-01 MED ORDER — ASPIRIN 81 MG PO TBEC
81.0000 mg | DELAYED_RELEASE_TABLET | Freq: Every day | ORAL | 0 refills | Status: DC
  Filled 2021-04-01: qty 30, 30d supply, fill #0

## 2021-04-01 MED ORDER — TICAGRELOR 90 MG PO TABS
90.0000 mg | ORAL_TABLET | Freq: Two times a day (BID) | ORAL | 0 refills | Status: DC
  Filled 2021-04-01: qty 60, 30d supply, fill #0

## 2021-04-01 MED ORDER — METOPROLOL SUCCINATE ER 25 MG PO TB24
12.5000 mg | ORAL_TABLET | Freq: Every day | ORAL | 0 refills | Status: DC
  Filled 2021-04-01: qty 15, 30d supply, fill #0

## 2021-04-01 NOTE — Plan of Care (Signed)
DISCHARGE NOTE HOME Dean Boyd to be discharged home per MD order. Discussed prescriptions and follow up appointments with the patient. Medication list explained in detail. Patient verbalized understanding.  Skin clean, dry and intact without evidence of skin break down, no evidence of skin tears noted. IV catheter discontinued intact. Site without signs and symptoms of complications. Dressing and pressure applied. Pt denies pain at the site currently. No complaints noted.  Patient free of lines, drains, and wounds.   An After Visit Summary (AVS) was printed and given to the patient.   Arlice Colt, RN

## 2021-04-01 NOTE — TOC Initial Note (Signed)
Transition of Care [facility]) - Initial/Assessment Note    Patient Details  Name: Dean Boyd MRN: [id number] Date of Birth: 08/03/1980  Transition of Care [facility]) CM/SW Contact:    Gildardo Griffes, LCSW Phone Number: 04/01/2021, 10:31 AM  Clinical Narrative:                  CSW spoke with patient at bedside, discussed the plan for him to eventually transition to Plavix per pharm and that medications would be $8 , he reports this is okay. CSW explained that plan at dc would be for medications to be sent to medication management clinic for free of charge. Patient identifies not having a PCP, provided [facility] info. Patient agreeable to follow up with [facility] to establish with PCP. Reports he has a friend who will transport him at time of discharge.   No further dc needs identified at this time.   Please contact TOC should needs arise.   Expected Discharge Plan: Home/Self Care Barriers to Discharge: Continued Medical Work up   Patient Goals and CMS Choice Patient states their goals for this hospitalization and ongoing recovery are:: to go home CMS Medicare.gov Compare Post Acute Care list provided to:: Patient Choice offered to / list presented to : Patient  Expected Discharge Plan and Services Expected Discharge Plan: Home/Self Care                                              Prior Living Arrangements/Services   Lives with:: Self                   Activities of Daily Living Home Assistive Devices/Equipment: None ADL Screening (condition at time of admission) Patient's cognitive ability adequate to safely complete daily activities?: Yes Is the patient deaf or have difficulty hearing?: No Does the patient have difficulty seeing, even when wearing glasses/contacts?: No Does the patient have difficulty concentrating, remembering, or making decisions?: No Patient able to express need for assistance with ADLs?: Yes Does the patient have difficulty dressing or  bathing?: No Independently performs ADLs?: No Communication: Independent Dressing (OT): Independent Grooming: Independent Feeding: Independent Bathing: Independent Toileting: Independent In/Out Bed: Independent Walks in Home: Independent Does the patient have difficulty walking or climbing stairs?: No Weakness of Legs: None Weakness of Arms/Hands: None  Permission Sought/Granted                  Emotional Assessment       Orientation: : Oriented to Self, Oriented to Place, Oriented to  Time, Oriented to Situation Alcohol / Substance Use: Not Applicable Psych Involvement: No (comment)  Admission diagnosis:  NSTEMI (non-ST elevated myocardial infarction) (HCC) [I21.4] Chest pain, unspecified type [R07.9] Patient Active Problem List   Diagnosis Date Noted   NSTEMI (non-ST elevated myocardial infarction) (HCC) 03/30/2021   Coronary artery disease    Smoking    ST elevation myocardial infarction (STEMI) (HCC) 05/23/2017   Polysubstance abuse (HCC) 05/23/2017   Non-ST elevation myocardial infarction (NSTEMI) (HCC)    Chest pain 05/16/2017   Intractable vomiting with nausea 05/12/2016   PCP:  Pcp, No Pharmacy:   [facility] [phone] [place], [place] - [address] AT [facility] [address] [place] [address] Phone: [phone] Fax: [phone]  [facility] [address], [place] - [address] [place] [address] Phone: [phone] Fax: [phone]  [facility] [id number] [place], [place] - [address] [place] [address] Phone: [phone] Fax: [phone]     Social Determinants of Health (SDOH) Interventions    Readmission Risk Interventions No flowsheet data found.

## 2021-04-01 NOTE — Progress Notes (Signed)
Progress Note  Patient Name: Dean Boyd Date of Encounter: 04/01/2021  Primary Cardiologist: Yvonne Kendall, MD  Subjective   No chest pain or sob.  Eager to go home.  R wrist was swollen last night - better this AM.  Inpatient Medications    Scheduled Meds:  aspirin EC  81 mg Oral Daily   atorvastatin  80 mg Oral Daily   influenza vac split quadrivalent PF  0.5 mL Intramuscular Tomorrow-1000   metoprolol succinate  12.5 mg Oral Daily   nicotine  21 mg Transdermal Daily   pantoprazole  40 mg Oral Daily   sodium chloride flush  3 mL Intravenous Q12H   ticagrelor  90 mg Oral BID   Continuous Infusions:  sodium chloride     PRN Meds: sodium chloride, acetaminophen, alum & mag hydroxide-simeth, nitroGLYCERIN, ondansetron (ZOFRAN) IV, sodium chloride flush   Vital Signs    Vitals:   03/31/21 1830 03/31/21 2000 04/01/21 0115 04/01/21 0511  BP: (!) 150/102 (!) 134/91 117/78 135/88  Pulse: 79 88 73 69  Resp: 14 20 20 20   Temp:  98 F (36.7 C) 98.4 F (36.9 C) 97.9 F (36.6 C)  TempSrc:      SpO2: 99% 98% 95% 99%  Weight:      Height:        Intake/Output Summary (Last 24 hours) at 04/01/2021 1101 Last data filed at 03/31/2021 1123 Gross per 24 hour  Intake 0 ml  Output --  Net 0 ml   Filed Weights   03/30/21 1250 03/30/21 2050  Weight: 73.5 kg 73.3 kg    Physical Exam   GEN: Well nourished, well developed, in no acute distress.  HEENT: Grossly normal.  Neck: Supple, no JVD, carotid bruits, or masses. Cardiac: RRR, no murmurs, rubs, or gallops. No clubbing, cyanosis, edema.  Radials 2+, DP/PT 2+ and equal bilaterally.  R radial cath site is mildly swollen w/ significant hematoma.  No bleeding/bruit. Respiratory:  Respirations regular and unlabored, clear to auscultation bilaterally. GI: Soft, nontender, nondistended, BS + x 4. MS: no deformity or atrophy. Skin: warm and dry, no rash. Neuro:  Strength and sensation are intact. Psych: AAOx3.  Normal  affect.  Labs    Chemistry Recent Labs  Lab 03/30/21 1055 03/31/21 0140 04/01/21 0737  NA 136 136 138  K 3.9 4.0 3.7  CL 106 106 107  CO2 24 25 23   GLUCOSE 109* 97 107*  BUN 11 13 11   CREATININE 0.98 1.12 1.11  CALCIUM 9.0 9.1 9.1  PROT 7.7  --   --   ALBUMIN 4.1  --   --   AST 20  --   --   ALT 18  --   --   ALKPHOS 52  --   --   BILITOT 0.8  --   --   GFRNONAA >60 >60 >60  ANIONGAP 6 5 8      Hematology Recent Labs  Lab 03/30/21 1055 03/31/21 0140 04/01/21 0737  WBC 12.1* 11.5* 10.8*  RBC 5.06 4.91 5.06  HGB 14.4 13.8 14.0  HCT 45.6 43.7 44.9  MCV 90.1 89.0 88.7  MCH 28.5 28.1 27.7  MCHC 31.6 31.6 31.2  RDW 12.9 12.8 12.7  PLT 248 232 239    Cardiac Enzymes  Recent Labs  Lab 03/30/21 1055 03/30/21 1246 03/30/21 1608  TROPONINIHS 190* 190* 197*      Lipids  Lab Results  Component Value Date   CHOL 192 03/31/2021   HDL  61 03/31/2021   LDLCALC 80 03/31/2021   TRIG 255 (H) 03/31/2021   CHOLHDL 3.1 03/31/2021    HbA1c  Lab Results  Component Value Date   HGBA1C 5.0 04/28/2020    Radiology    DG Chest 2 View  Result Date: 03/30/2021 CLINICAL DATA:  Chest pain EXAM: CHEST - 2 VIEW COMPARISON:  Chest x-ray 05/25/2020 FINDINGS: Heart size and mediastinal contours are within normal limits. No suspicious pulmonary opacities identified. No pleural effusion or pneumothorax visualized. No acute osseous abnormality appreciated. IMPRESSION: No acute intrathoracic process identified. Electronically Signed   By: Jannifer Hick M.D.   On: 03/30/2021 10:17   Telemetry    RSR - Personally Reviewed  ECG    RSR, 70, LVH w/ repolarization abnormalities, inferolateral TWI - Personally Reviewed  Cardiac Studies   2D Echocardiogram 2.15.2023  1. Left ventricular ejection fraction, by estimation, is 50%. The left  ventricle has low normal function. The left ventricle has no regional wall  motion abnormalities. Left ventricular diastolic parameters  were normal.   2. Right ventricular systolic function is normal. The right ventricular  size is normal.   3. The mitral valve is normal in structure. No evidence of mitral valve  regurgitation.   4. The aortic valve is grossly normal. Aortic valve regurgitation is not  visualized.   5. The inferior vena cava is normal in size with greater than 50%  respiratory variability, suggesting right atrial pressure of 3 mmHg.  _____________   Cardiac Catheterization and Percutaneous Coronary Intervention 2.16.2023  Left Main  Vessel was injected. Vessel is normal in caliber.  Left Anterior Descending  Vessel is angiographically normal.  Prox LAD lesion is 95% stenosed. Vessel is the culprit lesion. The lesion is type A, located proximal to the major branch, discrete and eccentric.      **The proximal LAD was successfully treated w/ a 3.0 x 12 mm Onyx Frontier DES**  First Diagonal Branch  Vessel is small in size. Vessel is angiographically normal.  First Septal Branch  Vessel is small in size.  Ramus Intermedius  The vessel exhibits minimal luminal irregularities.  Left Circumflex  Collaterals  Dist Cx filled by collaterals from RPAV.    Prox Cx to Mid Cx lesion is 100% stenosed. The lesion is chronically occluded. Collateral flow improved compared to previous cath  First Obtuse Marginal Branch  Vessel is small in size. The vessel exhibits minimal luminal irregularities.  Second Obtuse Marginal Branch  Left Posterior Atrioventricular Artery  Vessel is small in size.  Right Coronary Artery  Vessel was injected. Vessel is large. The vessel exhibits minimal luminal irregularities.  Right Posterior Descending Artery  The vessel exhibits minimal luminal irregularities.  Right Posterior Atrioventricular Artery  The vessel exhibits minimal luminal irregularities.  First Right Posterolateral Branch  The vessel exhibits minimal luminal irregularities.  Second Right Posterolateral Branch  The  vessel exhibits minimal luminal irregularities.   Patient Profile     41 y.o. male w/a h/o CAD, HTN, HL, noncompliance, and polysubstance abuse, who was admitted 2/15 w/ NSTEMI.  Cath 2/16 w/ sev prox LAD dzs and CTO of LCX, now s/p DES  LAD.  Assessment & Plan    1.  NSTEMI/CAD:  Known prior h/o CTO LCX w/ R  L collaterals.  Presented 2/15 w/ chest pain.  HsTrop peaked @ 197.  Relatively flat trend.  EF 50% by echo.  Cath w/ new, severe LAD dzs, CTO LCX, and patent RCA.  S/p PCI/DES  to the LAD.  No c/p overnight.  R wrist mildly swollen but no bleeding/bruit or significant hematoma.  Discussed the importance of compliance w/ meds and follow-up.  Cont asa, brilinta, ? blocker, and statin rx.  He doesn't think he'll be able to afford brilinta long term.  Plan for brilinta 90mg  bid x 30 days (free card provided by pharmacy team), and then we'll load w/ 300 of plavix and switch to 75mg  daily in outpt setting.  Would benefit from cardiac rehab, but doesn't have insurance.  2.  Ischemic Cardiomyopathy:  low-nl EF @ 50% w/o rwma.  LVEDP 13 on cath.  Euvolemic on exam.  Cont ? blocker.  3.  Essential HTN:  Pressures higher yesterday evening, but ok this AM.  Cont ? blocker.  4.  HL:  LDL 80.  Cont high potency statin rx.  5.  Tob/marijuana use:  cessation advised.  Signed, Nicolasa Ducking, NP  04/01/2021, 11:01 AM    For questions or updates, please contact   Please consult [email or web address] for contact info under Cardiology/STEMI.

## 2021-04-01 NOTE — Progress Notes (Signed)
Pt complained of pain on post cath site (right radial). Noted minimal swelling around the area, it also starting to get purplish. It's not a hard mass. Already marked the area. PRN Tylenol given. Reinforced to pt not to bend or move his right wrist. Pt amenable. Will continue to monitor the swelling.

## 2021-04-02 NOTE — Discharge Summary (Signed)
Physician Discharge Summary   Patient: Dean Boyd MRN: [id number] DOB: 02/07/1981  Admit date:     03/30/2021  Discharge date: 04/01/2021  Discharge Physician: Max Sane   PCP: Pcp, No   Recommendations at discharge:   Follow-up with outpatient providers as requested  Although not sure diagnoses: Principal Problem:   NSTEMI (non-ST elevated myocardial infarction) [facility]) Active Problems:   Polysubstance abuse ([place])   Coronary artery disease   Smoking    Hospital Course: 41 year old male with a known history of CAD admitted for unstable angina and elevated troponins  2/16: S/p cardiac cath showing LCx-OM known CTO (collaterals are better) - culprit is 95% prox LAD just prior to D1.  DES PCI.    Assessment and Plan: * NSTEMI (non-ST elevated myocardial infarction) ([place])- (present on admission) Coronary artery disease- (present on admission) S/p cardiac cath - LCx-OM known CTO (collaterals are better) - culprit is 95% prox LAD just prior to D1.  DES PCI.  went well.   Continue Brilinta 90 mg twice daily for 30 days along with aspirin, Lipitor, metoprolol  Outpatient cardiac rehab  Polysubstance abuse [facility])- (present on admission) Counseled  Ischemic cardiomyopathy Essential hypertension Hyperlipidemia         Consultants: Cardiology Procedures performed: Cardiac cath on 2/16 Disposition: Home Diet recommendation:  Discharge Diet Orders (From admission, onward)     Start     Ordered   04/01/21 0000  Diet - low sodium heart healthy        04/01/21 1134           Cardiac diet  DISCHARGE MEDICATION: Allergies as of 04/01/2021   No Known Allergies      Medication List     TAKE these medications    Aspirin Adult Low Strength 81 MG EC tablet Generic drug: aspirin Take 1 tablet (81 mg total) by mouth once daily. Swallow whole.   atorvastatin 40 MG tablet Commonly known as: Lipitor Take 1 tablet (40 mg total) by mouth once daily.   Brilinta  90 MG Tabs tablet Generic drug: ticagrelor Take 1 tablet (90 mg total) by mouth 2 (two) times daily.   metoprolol succinate 25 MG 24 hr tablet Commonly known as: TOPROL-XL Take 1/2 tablet (12.5 mg total) by mouth once daily.   nitroGLYCERIN 0.4 MG SL tablet Commonly known as: NITROSTAT Place 1 tablet (0.4 mg total) under the tongue every 5 (five) minutes as needed for chest pain. Maximum of 3 doses.        Follow-up Information     Theora Gianotti, NP. Schedule an appointment as soon as possible for a visit on 04/29/2021.   Specialties: Nurse Practitioner, Cardiology, Radiology Why: [facility] Discharge F/UP @ 10:05am Contact information: [address] [place] [address] [phone]                 Discharge Exam: Danley Danker Weights   03/30/21 1250 03/30/21 2050  Weight: 73.5 kg 73.3 kg   Constitutional:      General: He is in no acute distress    Appearance: He is well-developed.  HENT:     Head: Normocephalic and atraumatic.  Eyes:     Pupils: Pupils are equal, round, and reactive to light.  Cardiovascular:     Rate and Rhythm: Normal rate and regular rhythm.     Heart sounds: Normal heart sounds.  Pulmonary:     Effort: Pulmonary effort is normal.  Abdominal:    Soft, Benign Musculoskeletal:  General: Normal range of motion.     Cervical back: Normal range of motion and neck supple.  Skin:    General: Skin is warm and dry.  Neurological:     General: No focal deficit present.     Mental Status: He is alert.  Psychiatric:        Mood and Affect: Mood  anxious    Behavior: Behavior agitated  Condition at discharge: good  The results of significant diagnostics from this hospitalization (including imaging, microbiology, ancillary and laboratory) are listed below for reference.   Imaging Studies: DG Chest 2 View  Result Date: 03/30/2021 CLINICAL DATA:  Chest pain EXAM: CHEST - 2 VIEW COMPARISON:  Chest x-ray  05/25/2020 FINDINGS: Heart size and mediastinal contours are within normal limits. No suspicious pulmonary opacities identified. No pleural effusion or pneumothorax visualized. No acute osseous abnormality appreciated. IMPRESSION: No acute intrathoracic process identified. Electronically Signed   By: Ofilia Neas M.D.   On: 03/30/2021 10:17   CARDIAC CATHETERIZATION  Result Date: 03/31/2021   Prox Cx to Mid Cx lesion is 100% stenosed.->  Improved collateral flow from right-left and left-left   CULPRIT LESION: Prox LAD lesion is 95% stenosed.   A drug-eluting stent was successfully placed using a STENT ONYX FRONTIER 3.0X12.  Deployed at 3.1 mm   Post intervention, there is a 0% residual stenosis.   LV end diastolic pressure is normal.   There is no aortic valve stenosis. SUMMARY Severe two-vessel disease with known CTO of LCx-OM branch from previous catheterization, and now CULPRIT LESION being [place] 95% focal/eccentric proximal LAD lesion just prior to 1st Diag.  (TIMI-3 flow) Successful DES PCI reducing to 0% stenosis and maintaining TIMI-3 flow with ONYX FRONTIER DES 3.0 mm x 12 mm deployed to 3.1 mm. Otherwise stable minimal coronary disease. Preserved EF by Echo, high normal LVEDP of 13 to 14 mmHg. RECOMMENDATIONS Continue Aggressive Guideline Directed Medical Therapy for CAD DAPT as directed Glenetta Hew, MD  ECHOCARDIOGRAM COMPLETE  Result Date: 03/30/2021    ECHOCARDIOGRAM REPORT   Patient Name:   Dean Boyd Date of Exam: 03/30/2021 Medical Rec #:  [id number]       Height:       69.0 in Accession #:    [id number]      Weight:       162.0 lb Date of Birth:  February 07, 1981       BSA:          1.889 m Patient Age:    41 years        BP:           153/97 mmHg Patient Gender: M               HR:           78 bpm. Exam Location:  [facility] Procedure: 2D Echo, Cardiac Doppler, Color Doppler and Intracardiac            Opacification Agent Indications:     RO7.9 chest pain  History:         Patient has prior  history of Echocardiogram examinations. ICM,                  CAD; Risk Factors:Current Smoker. Drug and alcohol use.  Sonographer:     Charmayne Sheer Referring Phys:  [id number] Kate Sable Diagnosing Phys: Kate Sable MD IMPRESSIONS  1. Left ventricular ejection fraction, by estimation, is 50%. The left ventricle has low normal function. The left  ventricle has no regional wall motion abnormalities. Left ventricular diastolic parameters were normal.  2. Right ventricular systolic function is normal. The right ventricular size is normal.  3. The mitral valve is normal in structure. No evidence of mitral valve regurgitation.  4. The aortic valve is grossly normal. Aortic valve regurgitation is not visualized.  5. The inferior vena cava is normal in size with greater than 50% respiratory variability, suggesting right atrial pressure of 3 mmHg. FINDINGS  Left Ventricle: Left ventricular ejection fraction, by estimation, is 50%. The left ventricle has low normal function. The left ventricle has no regional wall motion abnormalities. Definity contrast agent was given IV to delineate the left ventricular endocardial borders. The left ventricular internal cavity size was normal in size. There is no left ventricular hypertrophy. Left ventricular diastolic parameters were normal. Right Ventricle: The right ventricular size is normal. No increase in right ventricular wall thickness. Right ventricular systolic function is normal. Left Atrium: Left atrial size was normal in size. Right Atrium: Right atrial size was normal in size. Pericardium: There is no evidence of pericardial effusion. Mitral Valve: The mitral valve is normal in structure. No evidence of mitral valve regurgitation. MV peak gradient, 3.0 mmHg. The mean mitral valve gradient is 1.0 mmHg. Tricuspid Valve: The tricuspid valve is grossly normal. Tricuspid valve regurgitation is not demonstrated. Aortic Valve: The aortic valve is grossly normal. Aortic valve  regurgitation is not visualized. Aortic valve mean gradient measures 3.0 mmHg. Aortic valve peak gradient measures 6.0 mmHg. Aortic valve area, by VTI measures 2.94 cm. Pulmonic Valve: The pulmonic valve was not well visualized. Pulmonic valve regurgitation is not visualized. Aorta: The aortic root and ascending aorta are structurally normal, with no evidence of dilitation. Venous: The inferior vena cava is normal in size with greater than 50% respiratory variability, suggesting right atrial pressure of 3 mmHg. IAS/Shunts: No atrial level shunt detected by color flow Doppler.  LEFT VENTRICLE PLAX 2D LVIDd:         4.78 cm      Diastology LVIDs:         4.33 cm      LV e' medial:    7.62 cm/s LV PW:         1.10 cm      LV E/e' medial:  8.7 LV IVS:        0.76 cm      LV e' lateral:   9.14 cm/s LVOT diam:     2.00 cm      LV E/e' lateral: 7.2 LV SV:         56 LV SV Index:   29 LVOT Area:     3.14 cm  LV Volumes (MOD) LV vol d, MOD A2C: 104.0 ml LV vol d, MOD A4C: 100.0 ml LV vol s, MOD A2C: 58.7 ml LV vol s, MOD A4C: 53.8 ml LV SV MOD A2C:     45.3 ml LV SV MOD A4C:     100.0 ml LV SV MOD BP:      47.2 ml RIGHT VENTRICLE RV Basal diam:  2.86 cm LEFT ATRIUM             Index        RIGHT ATRIUM           Index LA diam:        2.40 cm 1.27 cm/m   RA Area:     11.80 cm LA Vol (A2C):   46.9 ml 24.83  ml/m  RA Volume:   26.80 ml  14.19 ml/m LA Vol (A4C):   32.9 ml 17.42 ml/m LA Biplane Vol: 39.2 ml 20.75 ml/m  AORTIC VALVE                    PULMONIC VALVE AV Area (Vmax):    2.52 cm     PV Vmax:       1.02 m/s AV Area (Vmean):   2.73 cm     PV Vmean:      73.400 cm/s AV Area (VTI):     2.94 cm     PV VTI:        0.172 m AV Vmax:           122.00 cm/s  PV Peak grad:  4.2 mmHg AV Vmean:          77.700 cm/s  PV Mean grad:  2.0 mmHg AV VTI:            0.189 m AV Peak Grad:      6.0 mmHg AV Mean Grad:      3.0 mmHg LVOT Vmax:         97.90 cm/s LVOT Vmean:        67.600 cm/s LVOT VTI:          0.177 m LVOT/AV VTI  ratio: 0.94  AORTA Ao Root diam: 3.30 cm MITRAL VALVE MV Area (PHT): 5.42 cm    SHUNTS MV Area VTI:   3.23 cm    Systemic VTI:  0.18 m MV Peak grad:  3.0 mmHg    Systemic Diam: 2.00 cm MV Mean grad:  1.0 mmHg MV Vmax:       0.87 m/s MV Vmean:      55.9 cm/s MV Decel Time: 140 msec MV E velocity: 66.00 cm/s MV A velocity: 75.00 cm/s MV E/A ratio:  0.88 Kate Sable MD Electronically signed by Kate Sable MD Signature Date/Time: 03/30/2021/2:37:25 PM    Final     Microbiology: Results for orders placed or performed during the hospital encounter of 05/25/20  Resp Panel by RT-PCR (Flu A&B, Covid) Nasopharyngeal Swab     Status: None   Collection Time: 05/25/20  8:32 AM   Specimen: Nasopharyngeal Swab; Nasopharyngeal(NP) swabs in vial transport medium  Result Value Ref Range Status   SARS Coronavirus 2 by RT PCR NEGATIVE NEGATIVE Final    Comment: (NOTE) SARS-CoV-2 target nucleic acids are NOT DETECTED.  The SARS-CoV-2 RNA is generally detectable in upper respiratory specimens during the acute phase of infection. The lowest concentration of SARS-CoV-2 viral copies this assay can detect is 138 copies/mL. A negative result does not preclude SARS-Cov-2 infection and should not be used as the sole basis for treatment or other patient management decisions. A negative result may occur with  improper specimen collection/handling, submission of specimen other than nasopharyngeal swab, presence of viral mutation(s) within the areas targeted by this assay, and inadequate number of viral copies(<138 copies/mL). A negative result must be combined with clinical observations, patient history, and epidemiological information. The expected result is Negative.  Fact Sheet for Patients:  EntrepreneurPulse.com.au  Fact Sheet for Healthcare Providers:  IncredibleEmployment.be  This test is no t yet approved or cleared by the [place] FDA and  has been  authorized for detection and/or diagnosis of SARS-CoV-2 by FDA under an Emergency Use Authorization (EUA). This EUA will remain  in effect (meaning this test can be used) for the duration of the  COVID-19 declaration under Section 564(b)(1) of the Act, 21 U.S.C.section 360bbb-3(b)(1), unless the authorization is terminated  or revoked sooner.       Influenza A by PCR NEGATIVE NEGATIVE Final   Influenza B by PCR NEGATIVE NEGATIVE Final    Comment: (NOTE) The Xpert Xpress SARS-CoV-2/FLU/RSV plus assay is intended as an aid in the diagnosis of influenza from Nasopharyngeal swab specimens and should not be used as a sole basis for treatment. Nasal washings and aspirates are unacceptable for Xpert Xpress SARS-CoV-2/FLU/RSV testing.  Fact Sheet for Patients: EntrepreneurPulse.com.au  Fact Sheet for Healthcare Providers: IncredibleEmployment.be  This test is not yet approved or cleared by the [place] FDA and has been authorized for detection and/or diagnosis of SARS-CoV-2 by FDA under an Emergency Use Authorization (EUA). This EUA will remain in effect (meaning this test can be used) for the duration of the COVID-19 declaration under Section 564(b)(1) of the Act, 21 U.S.C. section 360bbb-3(b)(1), unless the authorization is terminated or revoked.  Performed at [facility], [address] [place], [place] [address]     Labs: CBC: Recent Labs  Lab 03/30/21 1055 03/31/21 0140 04/01/21 0737  WBC 12.1* 11.5* 10.8*  HGB 14.4 13.8 14.0  HCT 45.6 43.7 44.9  MCV 90.1 89.0 88.7  PLT 248 232 [id number]   Basic Metabolic Panel: Recent Labs  Lab 03/30/21 1055 03/31/21 0140 04/01/21 0737  NA 136 136 138  K 3.9 4.0 3.7  CL 106 106 107  CO2 24 25 23   GLUCOSE 109* 97 107*  BUN 11 13 11   CREATININE 0.98 1.12 1.11  CALCIUM 9.0 9.1 9.1   Liver Function Tests: Recent Labs  Lab 03/30/21 1055  AST 20  ALT 18  ALKPHOS 52  BILITOT  0.8  PROT 7.7  ALBUMIN 4.1   CBG: No results for input(s): GLUCAP in the last 168 hours.  Discharge time spent: greater than 30 minutes.  Signed: Max Sane, MD [facility] 04/02/2021

## 2021-04-04 ENCOUNTER — Other Ambulatory Visit: Payer: Self-pay | Admitting: *Deleted

## 2021-04-04 ENCOUNTER — Other Ambulatory Visit: Payer: Self-pay

## 2021-04-29 ENCOUNTER — Ambulatory Visit: Payer: Self-pay | Admitting: Nurse Practitioner

## 2021-04-29 ENCOUNTER — Encounter: Payer: Self-pay | Admitting: Nurse Practitioner

## 2021-04-29 NOTE — Progress Notes (Deleted)
? ? ?Office Visit  ?  ?Patient Name: Dean Boyd ?Date of Encounter: 04/29/2021 ? ?Primary Care Provider:  Pcp, No ?Primary Cardiologist:  Nelva Bush, MD ? ?Chief Complaint  ?  ?41 year old male with a history of coronary artery disease, NSTEMI, ischemic cardiomyopathy,  hypertension, hyperlipidemia, medication noncompliance, and polysubstance abuse. He presents today for clinic follow-up after a hospitalization 03/30/2021 for NSTEMI who underwent a cardiac catheterization 03/31/2021 and percutaneous coronary intervention with a drug-eluting stent to the left anterior descending artery for new severe proximal left anterior descending artery disease.  ? ?Past Medical History  ?  ?Past Medical History:  ?Diagnosis Date  ? Alcohol abuse   ? Coronary artery disease   ? a. 05/2017 Late presenting STEMI/Cath: LM nl, LAD nl, D1/2/3 nl, RI min irregs, LCX 126m, distal vessel fills via R->L collats, OM1 min irregs, RCA/RPDA/RPAV/RPL1/2 min irregs; b. 03/2021 NSTEMI/PCI: LM nl, LAD 95p (3.0x12 Onyx Frontier DES), D1 nl, RI min irregs, LCX 100p/m- filled via collats from RPAV, RCA min irregs, RPDA/RPAV/RPL1/2 min irregs.  ? Ischemic cardiomyopathy   ? a. 05/2017 Echo: EF 50-55%, mild asymmetric posterior hypertrophy. Sev basal-midinferolateral HK. Mild MR. Nl RV fxn. Nl RV fxn; b. 03/2021 Echo: EF 50%, no rwma, Nl RV fxn.  ? Marijuana abuse   ? Nausea and vomiting   ? Tobacco abuse   ? ?Past Surgical History:  ?Procedure Laterality Date  ? CORONARY STENT INTERVENTION N/A 03/31/2021  ? Procedure: CORONARY STENT INTERVENTION;  Surgeon: Leonie Man, MD;  Location: [place] [facility];  Service: Cardiovascular;  Laterality: N/A;  ? LEFT HEART CATH AND CORONARY ANGIOGRAPHY N/A 05/17/2017  ? Procedure: LEFT HEART CATH AND CORONARY ANGIOGRAPHY;  Surgeon: Wellington Hampshire, MD;  Location: [place] [facility];  Service: Cardiovascular;  Laterality: N/A;  ? LEFT HEART CATH AND CORONARY ANGIOGRAPHY N/A 03/31/2021  ? Procedure: LEFT  HEART CATH AND CORONARY ANGIOGRAPHY;  Surgeon: Leonie Man, MD;  Location: [place] [facility];  Service: Cardiovascular;  Laterality: N/A;  ? ? ?Allergies ? ?No Known Allergies ? ?History of Present Illness  ?  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 On March 30, 2021 he presented to the ED with chest pain, troponins peaking at 197 with a relatively flat trend. His echocardiogram showed an EF of 50%. 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 He underwent  a percutaneous coronary intervention with a drug-eluting stent to the left anterior descending artery. Prior to hospital discharge, he was counseled on medication adherence with aspirin, brilinta, beta blocker, and statin as prescribed. The patient did report concern of not being able to afford brilinta long term. The plan was to take brilinta 90mg  bid for 30 days with free card provided by pharmacy, then load with 300 of plavix and switch to 75mg  daily outpatient. Patient presents today for follow-up. He reports that over the last 30 days since being home from the hospital he ____ his medications. Reports ____ chest pain, DOE, PND, palpitations, swelling, syncope, presyncope, lightheadedness, early satiety.  ______ refrain from alcohol or substance use? _____ agreeable to plan to initiating plavix today.  ? ?Home Medications  ?  ?Current Outpatient Medications  ?Medication Sig Dispense Refill  ? aspirin 81 MG EC tablet Take 1 tablet (81 mg total) by mouth once daily. Swallow whole.  30 tablet 0  ? atorvastatin (LIPITOR) 40 MG tablet Take 1 tablet (40 mg total) by mouth once daily. 30 tablet 1  ? metoprolol succinate (TOPROL-XL) 25 MG 24 hr tablet Take 1/2 tablet (12.5 mg total) by mouth once daily. 15 tablet 0  ? nitroGLYCERIN (NITROSTAT) 0.4 MG SL  tablet Place 1 tablet (0.4 mg total) under the tongue every 5 (five) minutes as needed for chest pain. Maximum of 3 doses. 35 tablet 3  ? ticagrelor (BRILINTA) 90 MG TABS tablet Take 1 tablet (90 mg total) by mouth 2 (two) times daily. 60 tablet 0  ? ?No current facility-administered medications for this visit.  ?  ? ?Review of Systems  ?  ?Reports ____ chest pain, DOE, PND< palpitations, swelling, syncope, presyncope, lightheadedness, early satiety. ?All other systems reviewed and are otherwise negative except as noted above. ?  ? ?Physical Exam  ?  ?VS:  There were no vitals taken for this visit. , BMI There is no height or weight on file to calculate BMI. ?    ?GEN: Well nourished, well developed, in no acute distress. ?HEENT: normal. ?Neck: Supple, no JVD, carotid bruits, or masses. ?Cardiac: RRR, no murmurs, rubs, or gallops. No clubbing, cyanosis, edema.  Radials/DP/PT 2+ and equal bilaterally.  ?Respiratory:  Respirations regular and unlabored, clear to auscultation bilaterally. ?GI: Soft, nontender, nondistended, BS + x 4. ?MS: no deformity or atrophy. ?Skin: warm and dry, no rash. ?Neuro:  Strength and sensation are intact. ?Psych: Normal affect. ? ?Accessory Clinical Findings  ?  ?ECG personally reviewed by me today - *** - no acute changes. ? ?Lab Results  ?Component Value Date  ? WBC 10.8 (H) 04/01/2021  ? HGB 14.0 04/01/2021  ? HCT 44.9 04/01/2021  ? MCV 88.7 04/01/2021  ? PLT 239 04/01/2021  ? ?Lab Results  ?Component Value Date  ? CREATININE 1.11 04/01/2021  ? BUN 11 04/01/2021  ? NA 138 04/01/2021  ? K 3.7 04/01/2021  ? CL 107 04/01/2021  ? CO2 23 04/01/2021  ? ?Lab Results  ?Component Value Date  ? ALT 18 03/30/2021  ? AST 20 03/30/2021  ? ALKPHOS 52 03/30/2021  ? BILITOT 0.8 03/30/2021  ? ?Lab Results  ?Component Value Date  ? CHOL 192 03/31/2021  ? HDL 61 03/31/2021  ? [place] 80 03/31/2021  ? TRIG 255 (H) 03/31/2021  ? CHOLHDL 3.1 03/31/2021  ?  ?Lab Results  ?Component Value Date  ? HGBA1C 5.0  04/28/2020  ? ? ?Assessment & Plan  ?  ?1.  *** ? ? ?Murray Hodgkins, NP ?04/29/2021, 7:49 AM ? ?

## 2021-05-02 ENCOUNTER — Encounter: Payer: Self-pay | Admitting: Nurse Practitioner

## 2021-05-18 ENCOUNTER — Other Ambulatory Visit: Payer: Self-pay

## 2021-06-15 ENCOUNTER — Ambulatory Visit: Payer: Self-pay | Admitting: Nurse Practitioner

## 2021-06-15 NOTE — Progress Notes (Deleted)
Office Visit    Patient Name: Dean Boyd Date of Encounter: 06/15/2021  Primary Care Provider:  Pcp, No Primary Cardiologist:  Yvonne Kendall, MD  Chief Complaint    ***  Past Medical History    Past Medical History:  Diagnosis Date   Alcohol abuse    Coronary artery disease    a. 05/2017 Late presenting STEMI/Cath: LM nl, LAD nl, D1/2/3 nl, RI min irregs, LCX 140m, distal vessel fills via R->L collats, OM1 min irregs, RCA/RPDA/RPAV/RPL1/2 min irregs; b. 03/2021 NSTEMI/PCI: LM nl, LAD 95p (3.0x12 Onyx Frontier DES), D1 nl, RI min irregs, LCX 100p/m- filled via collats from RPAV, RCA min irregs, RPDA/RPAV/RPL1/2 min irregs.   Ischemic cardiomyopathy    a. 05/2017 Echo: EF 50-55%, mild asymmetric posterior hypertrophy. Sev basal-midinferolateral HK. Mild MR. Nl RV fxn. Nl RV fxn; b. 03/2021 Echo: EF 50%, no rwma, Nl RV fxn.   Marijuana abuse    Nausea and vomiting    Tobacco abuse    Past Surgical History:  Procedure Laterality Date   CORONARY STENT INTERVENTION N/A 03/31/2021   Procedure: CORONARY STENT INTERVENTION;  Surgeon: Marykay Lex, MD;  Location: [facility];  Service: Cardiovascular;  Laterality: N/A;   LEFT HEART CATH AND CORONARY ANGIOGRAPHY N/A 05/17/2017   Procedure: LEFT HEART CATH AND CORONARY ANGIOGRAPHY;  Surgeon: Iran Ouch, MD;  Location: [facility];  Service: Cardiovascular;  Laterality: N/A;   LEFT HEART CATH AND CORONARY ANGIOGRAPHY N/A 03/31/2021   Procedure: LEFT HEART CATH AND CORONARY ANGIOGRAPHY;  Surgeon: Marykay Lex, MD;  Location: [facility];  Service: Cardiovascular;  Laterality: N/A;    Allergies  No Known Allergies  History of Present Illness    ***  Home Medications    Current Outpatient Medications  Medication Sig Dispense Refill   aspirin 81 MG EC tablet Take 1 tablet (81 mg total) by mouth once daily. Swallow whole. 30 tablet 0   atorvastatin (LIPITOR) 40 MG tablet Take 1 tablet (40 mg  total) by mouth once daily. 30 tablet 1   metoprolol succinate (TOPROL-XL) 25 MG 24 hr tablet Take 1/2 tablet (12.5 mg total) by mouth once daily. 15 tablet 0   nitroGLYCERIN (NITROSTAT) 0.4 MG SL tablet Place 1 tablet (0.4 mg total) under the tongue every 5 (five) minutes as needed for chest pain. Maximum of 3 doses. 35 tablet 3   ticagrelor (BRILINTA) 90 MG TABS tablet Take 1 tablet (90 mg total) by mouth 2 (two) times daily. 60 tablet 0   No current facility-administered medications for this visit.     Review of Systems    ***.  All other systems reviewed and are otherwise negative except as noted above.    Physical Exam    VS:  There were no vitals taken for this visit. , BMI There is no height or weight on file to calculate BMI.     GEN: Well nourished, well developed, in no acute distress. HEENT: normal. Neck: Supple, no JVD, carotid bruits, or masses. Cardiac: RRR, no murmurs, rubs, or gallops. No clubbing, cyanosis, edema.  Radials/DP/PT 2+ and equal bilaterally.  Respiratory:  Respirations regular and unlabored, clear to auscultation bilaterally. GI: Soft, nontender, nondistended, BS + x 4. MS: no deformity or atrophy. Skin: warm and dry, no rash. Neuro:  Strength and sensation are intact. Psych: Normal affect.  Accessory Clinical Findings    ECG personally reviewed by me today - *** - no acute  changes.  Lab Results  Component Value Date   WBC 10.8 (H) 04/01/2021   HGB 14.0 04/01/2021   HCT 44.9 04/01/2021   MCV 88.7 04/01/2021   PLT 239 04/01/2021   Lab Results  Component Value Date   CREATININE 1.11 04/01/2021   BUN 11 04/01/2021   NA 138 04/01/2021   K 3.7 04/01/2021   CL 107 04/01/2021   CO2 23 04/01/2021   Lab Results  Component Value Date   ALT 18 03/30/2021   AST 20 03/30/2021   ALKPHOS 52 03/30/2021   BILITOT 0.8 03/30/2021   Lab Results  Component Value Date   CHOL 192 03/31/2021   HDL 61 03/31/2021   LDLCALC 80 03/31/2021   TRIG 255 (H)  03/31/2021   CHOLHDL 3.1 03/31/2021    Lab Results  Component Value Date   HGBA1C 5.0 04/28/2020    Assessment & Plan    1.  ***   Nicolasa Ducking, NP 06/15/2021, 2:08 PM

## 2021-06-16 ENCOUNTER — Encounter: Payer: Self-pay | Admitting: Nurse Practitioner

## 2021-06-21 ENCOUNTER — Emergency Department
Admission: EM | Admit: 2021-06-21 | Discharge: 2021-06-21 | Disposition: A | Discharge status: Home / Self Care | Payer: Medicaid Other | Attending: Emergency Medicine | Admitting: Emergency Medicine

## 2021-06-21 ENCOUNTER — Emergency Department: Payer: Medicaid Other

## 2021-06-21 ENCOUNTER — Other Ambulatory Visit: Payer: Self-pay

## 2021-06-21 DIAGNOSIS — L089 Local infection of the skin and subcutaneous tissue, unspecified: Secondary | ICD-10-CM

## 2021-06-21 DIAGNOSIS — I2510 Atherosclerotic heart disease of native coronary artery without angina pectoris: Secondary | ICD-10-CM | POA: Insufficient documentation

## 2021-06-21 DIAGNOSIS — N485 Ulcer of penis: Secondary | ICD-10-CM | POA: Insufficient documentation

## 2021-06-21 DIAGNOSIS — R079 Chest pain, unspecified: Secondary | ICD-10-CM | POA: Insufficient documentation

## 2021-06-21 LAB — CBC
HCT: 42.2 % (ref 39.0–52.0)
Hemoglobin: 13.2 g/dL (ref 13.0–17.0)
MCH: 28 pg (ref 26.0–34.0)
MCHC: 31.3 g/dL (ref 30.0–36.0)
MCV: 89.4 fL (ref 80.0–100.0)
Platelets: 276 10*3/uL (ref 150–400)
RBC: 4.72 MIL/uL (ref 4.22–5.81)
RDW: 12.8 % (ref 11.5–15.5)
WBC: 10 10*3/uL (ref 4.0–10.5)
nRBC: 0 % (ref 0.0–0.2)

## 2021-06-21 LAB — BASIC METABOLIC PANEL
Anion gap: 9 (ref 5–15)
BUN: 10 mg/dL (ref 6–20)
CO2: 23 mmol/L (ref 22–32)
Calcium: 9.1 mg/dL (ref 8.9–10.3)
Chloride: 105 mmol/L (ref 98–111)
Creatinine, Ser: 1.15 mg/dL (ref 0.61–1.24)
GFR, Estimated: >60 mL/min (ref >60)
Glucose, Bld: 190 mg/dL — ABNORMAL HIGH (ref 70–99)
Potassium: 3.6 mmol/L (ref 3.5–5.1)
Sodium: 137 mmol/L (ref 135–145)

## 2021-06-21 LAB — CHLAMYDIA/NGC RT PCR (ARMC ONLY)
Chlamydia Tr: NOT DETECTED
N gonorrhoeae: NOT DETECTED

## 2021-06-21 LAB — TROPONIN I (HIGH SENSITIVITY)
Troponin I (High Sensitivity): 5 ng/L (ref <18)
Troponin I (High Sensitivity): 5 ng/L (ref <18)

## 2021-06-21 LAB — RAPID HIV SCREEN (HIV 1/2 AB+AG)
HIV 1/2 Antibodies: NONREACTIVE
HIV-1 P24 Antigen - HIV24: NONREACTIVE

## 2021-06-21 MED ORDER — DOXYCYCLINE HYCLATE 100 MG PO TABS: 100.0000 mg | ORAL_TABLET | Freq: Two times a day (BID) | ORAL | 0 refills | Status: AC

## 2021-06-21 MED ORDER — SODIUM CHLORIDE 0.9 % IV SOLN
100.0000 mg | Freq: Once | INTRAVENOUS | Status: AC
  Administered 2021-06-21: 100 mg via INTRAVENOUS
  Filled 2021-06-21: qty 100

## 2021-06-21 MED ORDER — AZITHROMYCIN 500 MG PO TABS
1000.0000 mg | ORAL_TABLET | Freq: Once | ORAL | Status: AC
Start: 2021-06-21 — End: 2021-06-21
  Administered 2021-06-21: 1000 mg via ORAL
  Filled 2021-06-21: qty 2

## 2021-06-21 MED ORDER — KETOROLAC TROMETHAMINE 30 MG/ML IJ SOLN
30.0000 mg | Freq: Once | INTRAMUSCULAR | Status: AC
Start: 2021-06-21 — End: 2021-06-21
  Administered 2021-06-21: 30 mg via INTRAVENOUS
  Filled 2021-06-21: qty 1

## 2021-06-21 MED ORDER — ATORVASTATIN CALCIUM 40 MG PO TABS: 40.0000 mg | ORAL_TABLET | Freq: Every day | ORAL | 1 refills | Status: DC

## 2021-06-21 MED ORDER — NITROGLYCERIN 0.4 MG SL SUBL: 0.4000 mg | SUBLINGUAL_TABLET | SUBLINGUAL | 3 refills | Status: DC | PRN

## 2021-06-21 MED ORDER — METOPROLOL SUCCINATE ER 25 MG PO TB24: 12.5000 mg | ORAL_TABLET | Freq: Every day | ORAL | 0 refills | Status: DC

## 2021-06-21 MED ORDER — TICAGRELOR 90 MG PO TABS: 90.0000 mg | ORAL_TABLET | Freq: Two times a day (BID) | ORAL | 0 refills | Status: DC

## 2021-06-21 NOTE — ED Provider Notes (Signed)
? ?[facility] ?Provider Note ? ? ? Event Date/Time  ? First MD Initiated Contact with Patient 06/21/21 1750   ?  (approximate) ? ? ?History  ? ?Groin wound ? ? ?HPI ? ?Kareen Hitsman is a 41 y.o. male  who, per discharge paperwork dated 04/01/21 had admission for NSTEMI who presents to the emergency department today because of concern for groin wound. He states that it started a few days ago after he pulled a tick off. He is unsure how long the tick might have been on him. The patient says that the pain has been severe. The patient denies any recent sexual activity, states last sexually active 2-3 months ago.  ? ? ?Physical Exam  ? ?Triage Vital Signs: ?ED Triage Vitals  ?Enc Vitals Group  ?   BP 06/21/21 1635 124/84  ?   Pulse Rate 06/21/21 1635 78  ?   Resp 06/21/21 1635 18  ?   Temp 06/21/21 1635 98.7 ?F (37.1 ?C)  ?   Temp Source 06/21/21 1635 Oral  ?   SpO2 06/21/21 1635 96 %  ?   Weight --   ?   Height --   ?   Head Circumference --   ?   Peak Flow --   ?   Pain Score 06/21/21 1633 10  ?   Pain Loc --   ?   Pain Edu? --   ?   Excl. in GC? --   ? ? ?Most recent vital signs: ?Vitals:  ? 06/21/21 1635  ?BP: 124/84  ?Pulse: 78  ?Resp: 18  ?Temp: 98.7 ?F (37.1 ?C)  ?SpO2: 96%  ? ? ?General: Awake, alert and oriented. ?CV:  Good peripheral perfusion. Regular rate and rhythm. ?Resp:  Normal effort. Lungs clear. ?Abd:  No distention.  ?Other:  Roughly 1.5 cm ulcerated lesion to the dorsal base of the penis.  ? ? ?ED Results / Procedures / Treatments  ? ?Labs ?(all labs ordered are listed, but only abnormal results are displayed) ?Labs Reviewed  ?BASIC METABOLIC PANEL - Abnormal; Notable for the following components:  ?    Result Value  ? Glucose, Bld 190 (*)   ? All other components within normal limits  ?CHLAMYDIA/NGC RT PCR ([facility] ONLY)            ?CBC  ?RAPID HIV SCREEN (HIV 1/2 AB+AG)  ?RPR  ?TROPONIN I (HIGH SENSITIVITY)  ?TROPONIN I (HIGH SENSITIVITY)  ? ? ? ?EKG ? ?IPhineas Semen,  attending physician, personally viewed and interpreted this EKG ? ?EKG Time: 1636 ?Rate: 81 ?Rhythm: sinus rhythm ?Axis: normal ?Intervals: qtc 411 ?QRS: narrow ?ST changes: t wave inversion II, III, avf, v4-v6 ?Impression: abnormal ekg ? ?RADIOLOGY ?I independently interpreted and visualized the CXR. My interpretation: No pneumonia. No pneumothorax. ?Radiology interpretation:  ?IMPRESSION:  ?No active cardiopulmonary disease.  ? ? ? ?PROCEDURES: ? ?Critical Care performed: No ? ?Procedures ? ? ?MEDICATIONS ORDERED IN ED: ?Medications - No data to display ? ? ?IMPRESSION / MDM / ASSESSMENT AND PLAN / ED COURSE  ?I reviewed the triage vital signs and the nursing notes. ?             ?               ? ?Differential diagnosis includes, but is not limited to, cellulitis, STD. ? ?Patient presented to the emergency department today with primary concern for wound to the base of his penis.  On exam he  does have roughly 1.5 cm ulcerated lesion there.  No crepitus or fluctuance felt.  Patient denies any recent sexual activity.  Did send off tests for HIV, syphilis chlamydia and gonorrhea.  Also consider possible chancroid.  Given patient stating that he pulled the tick off of that area we will treat with doxycycline, patient was given dose of azithromycin here in the emergency department as well.  Will have patient follow-up with urology. ? ?Patient had secondary complaint of some chest pain.  Patient does have a history of coronary disease however EKG and serial troponins were reassuring.  ? ? ? ? ?FINAL CLINICAL IMPRESSION(S) / ED DIAGNOSES  ? ?Final diagnoses:  ?Skin infection  ? ? ? ?Note:  This document was prepared using Dragon voice recognition software and may include unintentional dictation errors. ? ?  ?Phineas Semen, MD ?06/21/21 2242 ? ?

## 2021-06-21 NOTE — ED Triage Notes (Addendum)
Pt comes with c/o mid sternal CP that radiates to left side. Pt states 10/10 pain. Pt states SOb. Pt states this started two days ago.  ? ?Pt has extensive cardiac hx and pt had recent left heart cath in Feb 2023.  ?

## 2021-06-21 NOTE — ED Notes (Signed)
This RN assumed care of patient at [facility]. Patient has called out twice requesting pain medication and has sent son to nurse station asking for pain medication. Dr. Derrill Kay notified and at bedside. This RN attempted to draw labs and place IV. Patient verbalizes frustration with long wait time and states that he will go to another facility. Patient not sitting down for IV placement. Patient stating that it is ridiculous since he has been here so long and has not received treatment. Patient ambulated to hallway to make phone call to spouse. RN informed patient that if he wants labs drawn and IV placed to use call light. Patient currently walking in hallway attempting to make phone call. No distress noted.  ?

## 2021-06-21 NOTE — Discharge Instructions (Signed)
Please be sure to follow up with the urologist. Please seek medical attention for any high fevers, chest pain, shortness of breath, change in behavior, persistent vomiting, bloody stool or any other new or concerning symptoms. ? ?

## 2021-06-22 ENCOUNTER — Telehealth: Payer: Self-pay | Admitting: Emergency Medicine

## 2021-06-22 LAB — RPR
RPR Ser Ql: REACTIVE — AB
RPR Titer: 1:16 {titer}

## 2021-06-22 NOTE — Telephone Encounter (Signed)
Called patient due to rpr results.  He answered and I explained results.  I asked if has ever had syphilis in the past.  He says he has never heard of syphilis.  I explained that it is a venereal disease and that the sore he has on his penis may be from this.  I explained that he needs to be treated for this with an injection.   He said he does not have a pcp.  I explained that he can be treated at the health department, urgent care or he can always return here.  ?

## 2021-06-23 ENCOUNTER — Emergency Department
Admission: EM | Admit: 2021-06-23 | Discharge: 2021-06-23 | Disposition: A | Discharge status: Home / Self Care | Payer: Medicaid Other | Attending: Emergency Medicine | Admitting: Emergency Medicine

## 2021-06-23 ENCOUNTER — Other Ambulatory Visit: Payer: Self-pay

## 2021-06-23 ENCOUNTER — Encounter: Payer: Self-pay | Admitting: Emergency Medicine

## 2021-06-23 DIAGNOSIS — A64 Unspecified sexually transmitted disease: Secondary | ICD-10-CM | POA: Insufficient documentation

## 2021-06-23 DIAGNOSIS — A539 Syphilis, unspecified: Secondary | ICD-10-CM | POA: Insufficient documentation

## 2021-06-23 LAB — T.PALLIDUM AB, TOTAL: T Pallidum Abs: REACTIVE — AB

## 2021-06-23 MED ORDER — PENICILLIN G BENZATHINE 1200000 UNIT/2ML IM SUSY
2.4000 10*6.[IU] | PREFILLED_SYRINGE | Freq: Once | INTRAMUSCULAR | Status: AC
  Administered 2021-06-23: 2.4 10*6.[IU] via INTRAMUSCULAR
  Filled 2021-06-23: qty 4

## 2021-06-23 NOTE — ED Triage Notes (Signed)
Pt here due to a positive test result. Pt here for treatment. ?

## 2021-06-23 NOTE — ED Provider Notes (Signed)
? ?[facility] ?Provider Note ? ? ? Event Date/Time  ? First MD Initiated Contact with Patient 06/23/21 1457   ?  (approximate) ? ? ?History  ? ?SEXUALLY TRANSMITTED DISEASE ? ? ?HPI ? ?Dean Boyd is a 41 y.o. male with history of positive RPR recently.  Patient was seen here in the ED for possible chancre versus tick bite.  He has been taking doxycycline and azithromycin.  Patient has had unprotected sex.  Denies fever or chills ? ?  ? ? ?Physical Exam  ? ?Triage Vital Signs: ?ED Triage Vitals  ?Enc Vitals Group  ?   BP 06/23/21 1323 (!) 124/94  ?   Pulse Rate 06/23/21 1323 83  ?   Resp 06/23/21 1323 18  ?   Temp 06/23/21 1323 98.5 ?F (36.9 ?C)  ?   Temp Source 06/23/21 1323 Oral  ?   SpO2 06/23/21 1323 96 %  ?   Weight 06/23/21 1322 155 lb (70.3 kg)  ?   Height 06/23/21 1322 5\' 7"  (1.702 m)  ?   Head Circumference --   ?   Peak Flow --   ?   Pain Score 06/23/21 1322 7  ?   Pain Loc --   ?   Pain Edu? --   ?   Excl. in GC? --   ? ? ?Most recent vital signs: ?Vitals:  ? 06/23/21 1323  ?BP: (!) 124/94  ?Pulse: 83  ?Resp: 18  ?Temp: 98.5 ?F (36.9 ?C)  ?SpO2: 96%  ? ? ? ?General: Awake, no distress.   ?CV:  Good peripheral perfusion. regular rate and  rhythm ?Resp:  Normal effort.  ?Abd:  No distention.   ?Other:    ? ? ?ED Results / Procedures / Treatments  ? ?Labs ?(all labs ordered are listed, but only abnormal results are displayed) ?Labs Reviewed - No data to display ? ? ?EKG ? ? ? ? ?RADIOLOGY ? ? ? ? ?PROCEDURES: ? ? ?Procedures ? ? ?MEDICATIONS ORDERED IN ED: ?Medications  ?penicillin g benzathine (BICILLIN LA) 1200000 UNIT/2ML injection 2.4 Million Units (has no administration in time range)  ? ? ? ?IMPRESSION / MDM / ASSESSMENT AND PLAN / ED COURSE  ?I reviewed the triage vital signs and the nursing notes. ?             ?               ? ?Differential diagnosis includes, but is not limited to, false positive RPR, syphilis, STI, tick bite ? ?Patient's test from a few days ago is  positive for syphilis.  His GC/chlamydia test were both negative. ? ?I did explain these findings to the patient.  He is to follow-up with [facility] department.  He was given penicillin g benzathine (bicillin LA) 2,400,000 units IM.  He is to follow-up with [facility] department.  Explained to him we will need a HIV test that they can do there.  He is to notify all partners that he is positive for syphilis so they can also be tested and treated.  He is in agreement treatment plan.  Discharged stable condition. ? ? ? ? ?  ? ? ?FINAL CLINICAL IMPRESSION(S) / ED DIAGNOSES  ? ?Final diagnoses:  ?Syphilis  ? ? ? ?Rx / DC Orders  ? ?ED Discharge Orders   ? ? None  ? ?  ? ? ? ?Note:  This document was prepared using [facility]  and may include unintentional dictation errors. ? ?  ?Faythe Ghee, PA-C ?06/23/21 1727 ? ?  ?Chesley Noon, MD ?06/25/21 [phone] ? ?

## 2021-06-23 NOTE — Discharge Instructions (Addendum)
Follow-up with the [facility] department.  Please notify any partners that you are positive for syphilis as they will need to be treated.  You will need another treatment in about 1 week. ?

## 2021-06-23 NOTE — ED Provider Triage Note (Signed)
Emergency Medicine Provider Triage Evaluation Note ? ?Dean Boyd , a 41 y.o. male  was evaluated in triage.  Pt complains of callback for positive RPR. ? ?Review of Systems  ?Positive:  ?Negative:  ? ?Physical Exam  ?There were no vitals taken for this visit. ?Gen:   Awake, no distress   ?Resp:  Normal effort  ?MSK:   Moves extremities without difficulty  ?Other:   ? ?Medical Decision Making  ?Medically screening exam initiated at 1:21 PM.  Appropriate orders placed.  Dean Boyd was informed that the remainder of the evaluation will be completed by another provider, this initial triage assessment does not replace that evaluation, and the importance of remaining in the ED until their evaluation is complete. ? ? ?  ?Lavonia Drafts, MD ?06/23/21 1321 ? ?

## 2021-12-09 ENCOUNTER — Emergency Department: Payer: Self-pay

## 2021-12-09 ENCOUNTER — Emergency Department
Admission: EM | Admit: 2021-12-09 | Discharge: 2021-12-09 | Disposition: A | Discharge status: Home / Self Care | Payer: Self-pay | Attending: Emergency Medicine | Admitting: Emergency Medicine

## 2021-12-09 ENCOUNTER — Other Ambulatory Visit: Payer: Self-pay

## 2021-12-09 DIAGNOSIS — R0789 Other chest pain: Secondary | ICD-10-CM | POA: Insufficient documentation

## 2021-12-09 LAB — TROPONIN I (HIGH SENSITIVITY)
Troponin I (High Sensitivity): 5 ng/L (ref <18)
Troponin I (High Sensitivity): 5 ng/L (ref <18)

## 2021-12-09 LAB — BASIC METABOLIC PANEL
Anion gap: 6 (ref 5–15)
BUN: 11 mg/dL (ref 6–20)
CO2: 24 mmol/L (ref 22–32)
Calcium: 9 mg/dL (ref 8.9–10.3)
Chloride: 108 mmol/L (ref 98–111)
Creatinine, Ser: 1 mg/dL (ref 0.61–1.24)
GFR, Estimated: >60 mL/min (ref >60)
Glucose, Bld: 114 mg/dL — ABNORMAL HIGH (ref 70–99)
Potassium: 4 mmol/L (ref 3.5–5.1)
Sodium: 138 mmol/L (ref 135–145)

## 2021-12-09 LAB — CBC
HCT: 45.7 % (ref 39.0–52.0)
Hemoglobin: 14.5 g/dL (ref 13.0–17.0)
MCH: 28.2 pg (ref 26.0–34.0)
MCHC: 31.7 g/dL (ref 30.0–36.0)
MCV: 88.7 fL (ref 80.0–100.0)
Platelets: 249 10*3/uL (ref 150–400)
RBC: 5.15 MIL/uL (ref 4.22–5.81)
RDW: 12.8 % (ref 11.5–15.5)
WBC: 10.2 10*3/uL (ref 4.0–10.5)
nRBC: 0 % (ref 0.0–0.2)

## 2021-12-09 MED ORDER — NITROGLYCERIN 0.4 MG SL SUBL
0.4000 mg | SUBLINGUAL_TABLET | SUBLINGUAL | 3 refills | Status: DC | PRN
Start: 2021-12-09 — End: 2022-10-04
  Filled 2021-12-09 – 2022-01-03 (×2): qty 25, 8d supply, fill #0

## 2021-12-09 MED ORDER — ASPIRIN 81 MG PO CHEW
324.0000 mg | CHEWABLE_TABLET | Freq: Once | ORAL | Status: AC
  Administered 2021-12-09: 324 mg via ORAL
  Filled 2021-12-09: qty 4

## 2021-12-09 MED ORDER — ATORVASTATIN CALCIUM 40 MG PO TABS
40.0000 mg | ORAL_TABLET | Freq: Every day | ORAL | 1 refills | Status: DC
  Filled 2021-12-09 – 2022-01-03 (×2): qty 30, 30d supply, fill #0

## 2021-12-09 MED ORDER — METOPROLOL SUCCINATE ER 25 MG PO TB24
12.5000 mg | ORAL_TABLET | Freq: Every day | ORAL | 0 refills | Status: DC
  Filled 2021-12-09 – 2022-01-03 (×2): qty 15, 30d supply, fill #0

## 2021-12-09 MED ORDER — IOHEXOL 350 MG/ML SOLN
75.0000 mL | Freq: Once | INTRAVENOUS | Status: AC | PRN
  Administered 2021-12-09: 75 mL via INTRAVENOUS

## 2021-12-09 MED ORDER — TICAGRELOR 90 MG PO TABS
90.0000 mg | ORAL_TABLET | Freq: Two times a day (BID) | ORAL | 0 refills | Status: DC
Start: 2021-12-09 — End: 2022-10-04
  Filled 2021-12-09 – 2022-01-03 (×2): qty 60, 30d supply, fill #0

## 2021-12-09 NOTE — ED Notes (Signed)
Patient transported to x-ray. ?

## 2021-12-09 NOTE — Discharge Instructions (Addendum)
-  Take all your medications as prescribed.  Please establish with a primary care provider for regular evaluation.  Since you have a history of heart attack and do not have a established cardiologist, we will provide you with a referral to cardiology.  They should be contacting you within the first few days.  If they do not, please call the number listed in these instructions.  -In regards to your chest pain, we are unsure of the etiology of the pain, however it does not appear to be life-threatening.  There are no signs of infection or blood clots.  No signs of heart attack.  I suspect likely pleurisy (inflammation of the lung lining) or inflammation along the surrounding cartilage.  You may take acetaminophen (Tylenol) as needed for the pain.  -Return to the emergency department anytime if you begin to experience any new or worsening symptoms.

## 2021-12-09 NOTE — ED Triage Notes (Signed)
Pt arrives with c/o chest pain that started this morning. Pt denies n/v. Pt has hx of MI and stent placement. Pt endorses SOB.

## 2021-12-09 NOTE — ED Provider Notes (Signed)
[facility] Provider Note    Event Date/Time   First MD Initiated Contact with Patient 12/09/21 1104     (approximate)   History   Chief Complaint Chest Pain   HPI Dean Boyd is a 41 y.o. male, history of ACS (NSTEMI with stent placed in February 2023), polysubstance abuse, tobacco use, presents to the emergency department for evaluation of chest pain.  Patient states that he was walking to the bathroom this morning when he felt a sudden onset of left-sided chest pain.  He states it hurts most whenever he takes a deep breath or pushes on it.  He states it feels similar to the last time that he had a heart attack, but less intense.  Denies fever/chills, shortness of breath, abdominal pain, flank pain, nausea/vomiting, diarrhea, leg swelling, dysuria, headaches, rash/lesions, vertigo, or dizziness/lightheadedness.  Denies any recent injuries or illnesses.  History Limitations: No limitations.        Physical Exam  Triage Vital Signs: ED Triage Vitals  Enc Vitals Group     BP 12/09/21 1051 (!) 135/97     Pulse Rate 12/09/21 1051 (!) 108     Resp 12/09/21 1051 20     Temp 12/09/21 1051 98.4 F (36.9 C)     Temp Source 12/09/21 1051 Oral     SpO2 12/09/21 1051 96 %     Weight 12/09/21 1053 155 lb (70.3 kg)     Height --      Head Circumference --      Peak Flow --      Pain Score 12/09/21 1051 10     Pain Loc --      Pain Edu? --      Excl. in [place]? --     Most recent vital signs: Vitals:   12/09/21 1330 12/09/21 1345  BP: 121/86   Pulse: 71 84  Resp: 12 (!) 24  Temp:    SpO2: 99% 100%    General: Awake, NAD.  Skin: Warm, dry. No rashes or lesions.  Eyes: PERRL. Conjunctivae normal.  CV: Good peripheral perfusion.  S1 and S2 present, no murmurs, rubs, or gallops. Resp: Normal effort.  Lung sounds are clear bilaterally in the apices and bases. Abd: Soft, non-tender. No distention.  Neuro: At baseline. No gross neurological deficits.   Musculoskeletal: Normal ROM of all extremities.  Focused Exam: No leg swelling.  Physical Exam    ED Results / Procedures / Treatments  Labs (all labs ordered are listed, but only abnormal results are displayed) Labs Reviewed  BASIC METABOLIC PANEL - Abnormal; Notable for the following components:      Result Value   Glucose, Bld 114 (*)    All other components within normal limits  CBC  TROPONIN I (HIGH SENSITIVITY)  TROPONIN I (HIGH SENSITIVITY)     EKG Sinus rhythm, rate of 100, biatrial enlargement, T wave inversions in leads II, III, aVF, and V5/V6.  No ST segment elevation.  Normal QRS.  No QT prolongation.  Similar to his previous EKG back in 06/21/2021.    RADIOLOGY  ED Provider Interpretation: I personally viewed and interpreted this chest x-ray, no evidence of cardiopulmonary abnormalities.  CT angio does not show any evidence of pulmonary embolism.  CT Angio Chest PE W and/or Wo Contrast  Result Date: 12/09/2021 CLINICAL DATA:  Pulmonary embolism (PE) suspected, high prob EXAM: CT ANGIOGRAPHY CHEST WITH CONTRAST TECHNIQUE: Multidetector CT imaging of the chest was performed using the standard  protocol during bolus administration of intravenous contrast. Multiplanar CT image reconstructions and MIPs were obtained to evaluate the vascular anatomy. RADIATION DOSE REDUCTION: This exam was performed according to the departmental dose-optimization program which includes automated exposure control, adjustment of the mA and/or kV according to patient size and/or use of iterative reconstruction technique. CONTRAST:  48mL OMNIPAQUE IOHEXOL 350 MG/ML SOLN COMPARISON:  Chest radiograph from the same day. FINDINGS: Cardiovascular: Satisfactory opacification of the pulmonary arteries to the segmental level. No evidence of pulmonary embolism. Normal heart size. No pericardial effusion. Mediastinum/Nodes: No enlarged mediastinal, hilar, or axillary lymph nodes. Thyroid gland, trachea,  and esophagus demonstrate no significant findings. Lungs/Pleura: Lungs are clear. No pleural effusion or pneumothorax. Upper Abdomen: No acute abnormality. Musculoskeletal: No chest wall abnormality. No acute or significant osseous findings. Review of the MIP images confirms the above findings. IMPRESSION: 1. No evidence of acute pulmonary embolism. 2. Clear lungs. Electronically Signed   By: Feliberto Harts M.D.   On: 12/09/2021 12:56   DG Chest 2 View  Result Date: 12/09/2021 CLINICAL DATA:  41 year old male with chest pain onset this morning. History of MI. Shortness of breath. EXAM: CHEST - 2 VIEW COMPARISON:  Chest radiographs 06/21/2021 and earlier. FINDINGS: Lung volumes and mediastinal contours are stable and normal. Visualized tracheal air column is within normal limits. Both lungs appear stable and clear. No pneumothorax or pleural effusion. Negative visible bowel gas and osseous structures. IMPRESSION: Negative.  No acute cardiopulmonary abnormality. Electronically Signed   By: Odessa Fleming M.D.   On: 12/09/2021 11:11    PROCEDURES:  Critical Care performed: N/A.   Procedures    MEDICATIONS ORDERED IN ED: Medications  aspirin chewable tablet 324 mg (324 mg Oral Given 12/09/21 1121)  iohexol (OMNIPAQUE) 350 MG/ML injection 75 mL (75 mLs Intravenous Contrast Given 12/09/21 1242)     IMPRESSION / MDM / ASSESSMENT AND PLAN / ED COURSE  I reviewed the triage vital signs and the nursing notes.                              Differential diagnosis includes, but is not limited to, ACS, pericarditis, myocarditis, costochondritis, pleuritis, pulmonary embolism, anxiety, pneumonia.  ED Course Patient appears well, vitals within normal limits.  NAD.  Afebrile.  Given his history, will treat with 324 mg aspirin and placed on cardiac monitoring.  CBC shows no leukocytosis or anemia.  BMP shows no electrolyte abnormalities or AKI.  Initial EKG unremarkable.  Follow-up EKG shows no dynamic  changes.  Initial troponin 5.  Second troponin 5.  Unlikely ACS or marked rise/pericarditis.   Assessment/Plan Patient presents with sudden onset of chest pain that started earlier this morning while he was walking to the bathroom.  He states that his pain has somewhat resolved, though he still continues to have some pain when taking deep breath.  His lab work-up is reassuring.  No dynamic changes on EKG.  Troponin shows no evidence of ACS or marked rise/pericarditis.  CT angio shows no evidence of pulmonary embolism or acute findings.  On reevaluation, he states that he is progressively feeling better.  I suspect likely benign etiology, such as costochondritis, GERD, or pleurisy.  Recommend that he take Tylenol as needed for pain and continue to monitor his symptoms.  He additionally requested refills on his regular medications.  We will provide.  We will provide him with a furl to cardiology as well given his comorbidities.  Will discharge.  Considered admission for this patient, but given his stable presentation and unremarkable work-up, he is unlikely to benefit from admission.  Provided the patient with anticipatory guidance, return precautions, and educational material. Encouraged the patient to return to the emergency department at any time if they begin to experience any new or worsening symptoms. Patient expressed understanding and agreed with the plan.   Patient's presentation is most consistent with acute presentation with potential threat to life or bodily function.       FINAL CLINICAL IMPRESSION(S) / ED DIAGNOSES   Final diagnoses:  Atypical chest pain     Rx / DC Orders   ED Discharge Orders          Ordered    atorvastatin (LIPITOR) 40 MG tablet  Daily        12/09/21 1344    metoprolol succinate (TOPROL-XL) 25 MG 24 hr tablet  Daily        12/09/21 1344    nitroGLYCERIN (NITROSTAT) 0.4 MG SL tablet  Every 5 min PRN        12/09/21 1344    ticagrelor (BRILINTA) 90 MG  TABS tablet  2 times daily        12/09/21 1344    Ambulatory referral to Cardiology       Comments: If you have not heard from the Cardiology office within the next 72 hours please call [phone].   12/09/21 1356             Note:  This document was prepared using Dragon voice recognition software and may include unintentional dictation errors.   Varney Daily, [place] 12/09/21 1629    Arnaldo Natal, MD 12/11/21 Aretha Parrot

## 2021-12-29 ENCOUNTER — Other Ambulatory Visit: Payer: Self-pay

## 2022-01-02 ENCOUNTER — Emergency Department
Admission: EM | Admit: 2022-01-02 | Discharge: 2022-01-02 | Disposition: A | Discharge status: Home / Self Care | Payer: Self-pay | Attending: Emergency Medicine | Admitting: Emergency Medicine

## 2022-01-02 ENCOUNTER — Emergency Department: Payer: Self-pay

## 2022-01-02 DIAGNOSIS — R112 Nausea with vomiting, unspecified: Secondary | ICD-10-CM | POA: Insufficient documentation

## 2022-01-02 DIAGNOSIS — R1032 Left lower quadrant pain: Secondary | ICD-10-CM | POA: Insufficient documentation

## 2022-01-02 DIAGNOSIS — Z87891 Personal history of nicotine dependence: Secondary | ICD-10-CM | POA: Insufficient documentation

## 2022-01-02 DIAGNOSIS — Z955 Presence of coronary angioplasty implant and graft: Secondary | ICD-10-CM | POA: Insufficient documentation

## 2022-01-02 DIAGNOSIS — R1031 Right lower quadrant pain: Secondary | ICD-10-CM | POA: Insufficient documentation

## 2022-01-02 DIAGNOSIS — R197 Diarrhea, unspecified: Secondary | ICD-10-CM | POA: Insufficient documentation

## 2022-01-02 LAB — URINALYSIS, ROUTINE W REFLEX MICROSCOPIC
Bilirubin Urine: NEGATIVE
Glucose, UA: 50 mg/dL — AB
Hgb urine dipstick: NEGATIVE
Ketones, ur: 20 mg/dL — AB
Leukocytes,Ua: NEGATIVE
Nitrite: NEGATIVE
Protein, ur: 100 mg/dL — AB
Specific Gravity, Urine: >1.046 — ABNORMAL HIGH (ref 1.005–1.030)
pH: 5 (ref 5.0–8.0)

## 2022-01-02 LAB — HEPATIC FUNCTION PANEL
ALT: 19 U/L (ref 0–44)
AST: 22 U/L (ref 15–41)
Albumin: 4.4 g/dL (ref 3.5–5.0)
Alkaline Phosphatase: 49 U/L (ref 38–126)
Bilirubin, Direct: 0.1 mg/dL (ref 0.0–0.2)
Indirect Bilirubin: 0.9 mg/dL (ref 0.3–0.9)
Total Bilirubin: 1 mg/dL (ref 0.3–1.2)
Total Protein: 8.2 g/dL — ABNORMAL HIGH (ref 6.5–8.1)

## 2022-01-02 LAB — BASIC METABOLIC PANEL
Anion gap: 8 (ref 5–15)
BUN: 11 mg/dL (ref 6–20)
CO2: 24 mmol/L (ref 22–32)
Calcium: 9.4 mg/dL (ref 8.9–10.3)
Chloride: 105 mmol/L (ref 98–111)
Creatinine, Ser: 0.87 mg/dL (ref 0.61–1.24)
GFR, Estimated: >60 mL/min (ref >60)
Glucose, Bld: 133 mg/dL — ABNORMAL HIGH (ref 70–99)
Potassium: 3.5 mmol/L (ref 3.5–5.1)
Sodium: 137 mmol/L (ref 135–145)

## 2022-01-02 LAB — CBC
HCT: 45.3 % (ref 39.0–52.0)
Hemoglobin: 14.7 g/dL (ref 13.0–17.0)
MCH: 28.4 pg (ref 26.0–34.0)
MCHC: 32.5 g/dL (ref 30.0–36.0)
MCV: 87.6 fL (ref 80.0–100.0)
Platelets: 244 10*3/uL (ref 150–400)
RBC: 5.17 MIL/uL (ref 4.22–5.81)
RDW: 12.6 % (ref 11.5–15.5)
WBC: 14.2 10*3/uL — ABNORMAL HIGH (ref 4.0–10.5)
nRBC: 0 % (ref 0.0–0.2)

## 2022-01-02 LAB — TROPONIN I (HIGH SENSITIVITY)
Troponin I (High Sensitivity): 6 ng/L (ref <18)
Troponin I (High Sensitivity): 8 ng/L

## 2022-01-02 LAB — LIPASE, BLOOD: Lipase: 63 U/L — ABNORMAL HIGH (ref 11–51)

## 2022-01-02 MED ORDER — ONDANSETRON 4 MG PO TBDP
4.0000 mg | ORAL_TABLET | Freq: Once | ORAL | Status: AC
  Administered 2022-01-02: 4 mg via ORAL
  Filled 2022-01-02: qty 1

## 2022-01-02 MED ORDER — ONDANSETRON HCL 4 MG/2ML IJ SOLN
4.0000 mg | INTRAMUSCULAR | Status: AC
  Administered 2022-01-02: 4 mg via INTRAVENOUS
  Filled 2022-01-02: qty 2

## 2022-01-02 MED ORDER — SODIUM CHLORIDE 0.9 % IV BOLUS
750.0000 mL | Freq: Once | INTRAVENOUS | Status: AC
Start: 2022-01-02 — End: 2022-01-02
  Administered 2022-01-02: 750 mL via INTRAVENOUS

## 2022-01-02 MED ORDER — MORPHINE SULFATE (PF) 4 MG/ML IV SOLN
4.0000 mg | Freq: Once | INTRAVENOUS | Status: AC
  Administered 2022-01-02: 4 mg via INTRAVENOUS
  Filled 2022-01-02: qty 1

## 2022-01-02 MED ORDER — ALUM & MAG HYDROXIDE-SIMETH 200-200-20 MG/5ML PO SUSP
30.0000 mL | Freq: Once | ORAL | Status: AC
  Administered 2022-01-02: 30 mL via ORAL
  Filled 2022-01-02: qty 30

## 2022-01-02 MED ORDER — METOPROLOL SUCCINATE ER 25 MG PO TB24
12.5000 mg | ORAL_TABLET | Freq: Every day | ORAL | Status: DC
  Administered 2022-01-02: 12.5 mg via ORAL
  Filled 2022-01-02 (×2): qty 0.5

## 2022-01-02 MED ORDER — ONDANSETRON 4 MG PO TBDP: 4.0000 mg | ORAL_TABLET | Freq: Four times a day (QID) | ORAL | 0 refills | Status: AC | PRN

## 2022-01-02 MED ORDER — IOHEXOL 300 MG/ML  SOLN
100.0000 mL | Freq: Once | INTRAMUSCULAR | Status: AC | PRN
  Administered 2022-01-02: 100 mL via INTRAVENOUS

## 2022-01-02 MED ORDER — KETOROLAC TROMETHAMINE 30 MG/ML IJ SOLN
15.0000 mg | Freq: Once | INTRAMUSCULAR | Status: AC
  Administered 2022-01-02: 15 mg via INTRAVENOUS
  Filled 2022-01-02: qty 1

## 2022-01-02 NOTE — ED Notes (Signed)
Provided patient with water and crackers for PO challenge  Pt reports feeling much better.

## 2022-01-02 NOTE — ED Provider Notes (Signed)
[facility] Provider Note    Event Date/Time   First MD Initiated Contact with Patient 01/02/22 1126     (approximate)   History   Abdominal Pain and Chest Pain   HPI  Dean Boyd is a 41 y.o. male who has a previous history of NSTEMI, cardiac stenting, tobacco use  Patient reports that about on Thursday started having nausea and vomiting.  He reports he is dry heaves for several days now.  He is experiencing lower abdominal pain but no fever.  He is getting a burning feeling in his chest over the last couple days as well, no heavy chest pains or pressures though.  He reports he feels like he is having pain now from all the vomiting that he has been doing.  He is also had loose stools and diarrhea.  No black or bloody stools no bloody emesis  He does report a history of previous heart disease and a cardiac stent.  He does smoke marijuana on a regular basis.   No testicular or scrotal pain.  No pain or burning with urination.  Physical Exam   Triage Vital Signs: ED Triage Vitals  Enc Vitals Group     BP 01/02/22 1035 (!) 163/112     Pulse Rate 01/02/22 1035 74     Resp 01/02/22 1035 (!) 22     Temp 01/02/22 1035 98 F (36.7 C)     Temp Source 01/02/22 1035 Oral     SpO2 01/02/22 1035 99 %     Weight 01/02/22 1033 154 lb 5.2 oz (70 kg)     Height 01/02/22 1033 5\' 7"  (1.702 m)     Head Circumference --      Peak Flow --      Pain Score 01/02/22 1032 10     Pain Loc --      Pain Edu? --      Excl. in GC? --     Most recent vital signs: Vitals:   01/02/22 1230 01/02/22 1300  BP: (!) 178/102 (!) 163/102  Pulse: 68 69  Resp: 10 17  Temp:    SpO2: 95% 96%     General: Awake, no distress.  Appears mildly ill though and somewhat fatigued.  He reports he feels a bit dehydrated and nauseated and having moderate to severe pain in his lower abdomen. CV:  Good peripheral perfusion.  Normal heart tones and rate Resp:  Normal effort.  Clear  bilateral Abd:  No distention.  Reports moderate tenderness to palpation to all quadrants but localizes more pain in the lower quadrants bilaterally.  There is no rebound or guarding. Other:  Warm well-perfused lower extremities. Mucous membranes somewhat dry.  No active emesis or dry heaving at this moment   ED Results / Procedures / Treatments   Labs (all labs ordered are listed, but only abnormal results are displayed) Labs Reviewed  BASIC METABOLIC PANEL - Abnormal; Notable for the following components:      Result Value   Glucose, Bld 133 (*)    All other components within normal limits  CBC - Abnormal; Notable for the following components:   WBC 14.2 (*)    All other components within normal limits  HEPATIC FUNCTION PANEL - Abnormal; Notable for the following components:   Total Protein 8.2 (*)    All other components within normal limits  LIPASE, BLOOD - Abnormal; Notable for the following components:   Lipase 63 (*)  All other components within normal limits  URINALYSIS, ROUTINE W REFLEX MICROSCOPIC  TROPONIN I (HIGH SENSITIVITY)  TROPONIN I (HIGH SENSITIVITY)     EKG  EKG inter by me at 1002 heart rate 80 QRS 80 QTc 400 Normal sinus rhythm, left ventricular hypertrophy with repolarization abnormality.  Significant voltages noted in lateral precordial leads.  Compared to previous EKGs morphology appears unchanged   RADIOLOGY CT ABDOMEN PELVIS W CONTRAST  Result Date: 01/02/2022 CLINICAL DATA:  Acute generalized abdominal pain, vomiting. EXAM: CT ABDOMEN AND PELVIS WITH CONTRAST TECHNIQUE: Multidetector CT imaging of the abdomen and pelvis was performed using the standard protocol following bolus administration of intravenous contrast. RADIATION DOSE REDUCTION: This exam was performed according to the departmental dose-optimization program which includes automated exposure control, adjustment of the mA and/or kV according to patient size and/or use of iterative  reconstruction technique. CONTRAST:  OMNIPAQUE IOHEXOL 300 MG/ML  SOLN COMPARISON:  May 10, 2016. FINDINGS: Lower chest: No acute abnormality. Hepatobiliary: No focal liver abnormality is seen. No gallstones, gallbladder wall thickening, or biliary dilatation. Pancreas: Unremarkable. No pancreatic ductal dilatation or surrounding inflammatory changes. Spleen: Normal in size without focal abnormality. Adrenals/Urinary Tract: Adrenal glands are unremarkable. Kidneys are normal, without renal calculi, focal lesion, or hydronephrosis. Bladder is unremarkable. Stomach/Bowel: Stomach is within normal limits. Appendix appears normal. No evidence of bowel wall thickening, distention, or inflammatory changes. Vascular/Lymphatic: No significant vascular findings are present. No enlarged abdominal or pelvic lymph nodes. Reproductive: Prostate is unremarkable. Other: No abdominal wall hernia or abnormality. No abdominopelvic ascites. Musculoskeletal: No acute or significant osseous findings. IMPRESSION: No significant abnormality seen in the abdomen or pelvis. Electronically Signed   By: Lupita Raider M.D.   On: 01/02/2022 13:46   DG Chest Port 1 View  Result Date: 01/02/2022 CLINICAL DATA:  Chest pain EXAM: PORTABLE CHEST 1 VIEW COMPARISON:  12/09/2021 FINDINGS: The lungs are clear without focal pneumonia, edema, pneumothorax or pleural effusion. The cardiopericardial silhouette is within normal limits for size. The visualized bony structures of the thorax are unremarkable. Telemetry leads overlie the chest. IMPRESSION: No active disease. Electronically Signed   By: Kennith Center M.D.   On: 01/02/2022 12:42      Personally interpreted the patient's CT abdomen pelvis for acute pathology, I do not see evidence of acute gross abnormality on my interpretation, but will review radiologist read as well  PROCEDURES:  Critical Care performed: No  Procedures   MEDICATIONS ORDERED IN ED: Medications   metoprolol succinate (TOPROL-XL) 24 hr tablet 12.5 mg (has no administration in time range)  ondansetron (ZOFRAN-ODT) disintegrating tablet 4 mg (4 mg Oral Given 01/02/22 1038)  ondansetron (ZOFRAN) injection 4 mg (4 mg Intravenous Given 01/02/22 1201)  sodium chloride 0.9 % bolus 750 mL (0 mLs Intravenous Stopped 01/02/22 1339)  morphine (PF) 4 MG/ML injection 4 mg (4 mg Intravenous Given 01/02/22 1203)  ketorolac (TORADOL) 30 MG/ML injection 15 mg (15 mg Intravenous Given 01/02/22 1202)  iohexol (OMNIPAQUE) 300 MG/ML solution 100 mL (100 mLs Intravenous Contrast Given 01/02/22 1321)     IMPRESSION / MDM / ASSESSMENT AND PLAN / ED COURSE  I reviewed the triage vital signs and the nursing notes.                              Differential diagnosis includes but is not limited to, abdominal perforation, aortic dissection, cholecystitis, appendicitis, diverticulitis, colitis, esophagitis/gastritis, kidney stone, pyelonephritis, cannabis  induced vomiting, urinary tract infection, aortic aneurysm. All are considered in decision and treatment plan. Based upon the patient's presentation and risk factors, given the patient's notable lower abdominal pain leukocytosis and presentation we will proceed with CT imaging to exclude acute intra-abdominal pathology  Additionally reports some element of chest pain that seems quite atypical of ACS.  EKG without evidence of new ischemia, noted LVH with repolarization.  Troponin normal.  Doubt ACS, seems the patient may be experiencing some element of gastritis esophagitis reflux etc. seem much more likely at this point given his recent episode of abdominal pain nausea vomiting.   Patient's presentation is most consistent with acute complicated illness / injury requiring diagnostic workup.  The patient is on the cardiac monitor to evaluate for evidence of arrhythmia and/or significant heart rate changes.  ----------------------------------------- 1:42 PM on  01/02/2022 ----------------------------------------- Patient reports nausea and abdominal pain are much better.  Currently resting awaiting CT scan.  No chest pain   We will trial p.o. challenge.  If patient doing well able to stay hydrated without further emesis anticipate discharged home.  Blood pressure is elevated, likely secondary to inability to take by mouth over the last couple days.  Now that he is feeling improved will provide him with his metoprolol  Ongoing care assigned to Dr. Erma Heritage.  Patient performing p.o. challenge, awaiting urinalysis.  If those are reassuring, would anticipate discharge with antiemetic and careful return precautions related to abdominal pain nausea and vomiting.  FINAL CLINICAL IMPRESSION(S) / ED DIAGNOSES   Final diagnoses:  Nausea vomiting and diarrhea     Rx / DC Orders   ED Discharge Orders          Ordered    ondansetron (ZOFRAN-ODT) 4 MG disintegrating tablet  Every 6 hours PRN        01/02/22 1408             Note:  This document was prepared using Dragon voice recognition software and may include unintentional dictation errors.   Sharyn Creamer, MD 01/02/22 1535

## 2022-01-02 NOTE — ED Triage Notes (Signed)
Pt presents to the ED via POV due to abdominal pain and CP. Pt states his symptoms started Friday. Pt has hx of MI and stent placement. Pt states SOB. Pt A&Ox4

## 2022-01-02 NOTE — ED Notes (Signed)
Pt c/o of acid reflux, notified provider.

## 2022-01-02 NOTE — Discharge Instructions (Signed)
? ?  Please return to the emergency room right away if you are to develop a fever, severe nausea, your pain becomes severe or worsens, you are unable to keep food down, begin vomiting any dark or bloody fluid, you develop any dark or bloody stools, feel dehydrated, or other new concerns or symptoms arise. ? ?

## 2022-01-02 NOTE — ED Notes (Signed)
Pt has not attempted PO challenge, reminded patient he needs to try and eat/drink so he can be d/c home.

## 2022-01-02 NOTE — ED Notes (Signed)
Pt leaves with steady gait and all belongings. BP slightly elevated at d/c - MD aware and is OK with d./c

## 2022-01-02 NOTE — ED Notes (Signed)
Pt forgot to collect urine sample - notirfied provider.

## 2022-01-03 ENCOUNTER — Other Ambulatory Visit: Payer: Self-pay

## 2022-01-09 ENCOUNTER — Other Ambulatory Visit: Payer: Self-pay

## 2022-03-02 ENCOUNTER — Other Ambulatory Visit: Payer: Self-pay

## 2022-05-26 ENCOUNTER — Emergency Department
Admission: EM | Admit: 2022-05-26 | Discharge: 2022-05-26 | Disposition: A | Discharge status: Home / Self Care | Payer: Medicaid Other | Attending: Emergency Medicine | Admitting: Emergency Medicine

## 2022-05-26 ENCOUNTER — Emergency Department: Payer: Medicaid Other

## 2022-05-26 DIAGNOSIS — R079 Chest pain, unspecified: Secondary | ICD-10-CM | POA: Insufficient documentation

## 2022-05-26 DIAGNOSIS — M25552 Pain in left hip: Secondary | ICD-10-CM | POA: Diagnosis not present

## 2022-05-26 LAB — CBC
HCT: 43.3 % (ref 39.0–52.0)
Hemoglobin: 13.8 g/dL (ref 13.0–17.0)
MCH: 28.3 pg (ref 26.0–34.0)
MCHC: 31.9 g/dL (ref 30.0–36.0)
MCV: 88.9 fL (ref 80.0–100.0)
Platelets: 238 10*3/uL (ref 150–400)
RBC: 4.87 MIL/uL (ref 4.22–5.81)
RDW: 12.8 % (ref 11.5–15.5)
WBC: 12.2 10*3/uL — ABNORMAL HIGH (ref 4.0–10.5)
nRBC: 0 % (ref 0.0–0.2)

## 2022-05-26 LAB — BASIC METABOLIC PANEL
Anion gap: 11 (ref 5–15)
BUN: 8 mg/dL (ref 6–20)
CO2: 22 mmol/L (ref 22–32)
Calcium: 9.1 mg/dL (ref 8.9–10.3)
Chloride: 103 mmol/L (ref 98–111)
Creatinine, Ser: 1.02 mg/dL (ref 0.61–1.24)
GFR, Estimated: >60 mL/min (ref >60)
Glucose, Bld: 112 mg/dL — ABNORMAL HIGH (ref 70–99)
Potassium: 3.4 mmol/L — ABNORMAL LOW (ref 3.5–5.1)
Sodium: 136 mmol/L (ref 135–145)

## 2022-05-26 LAB — TROPONIN I (HIGH SENSITIVITY)
Troponin I (High Sensitivity): 5 ng/L (ref <18)
Troponin I (High Sensitivity): 6 ng/L (ref <18)

## 2022-05-26 MED ORDER — METOPROLOL SUCCINATE ER 25 MG PO TB24
12.5000 mg | ORAL_TABLET | Freq: Every day | ORAL | Status: DC
  Administered 2022-05-26: 12.5 mg via ORAL
  Filled 2022-05-26: qty 1

## 2022-05-26 MED ORDER — DEXAMETHASONE SODIUM PHOSPHATE 10 MG/ML IJ SOLN
6.0000 mg | Freq: Once | INTRAMUSCULAR | Status: AC
  Administered 2022-05-26: 6 mg via INTRAMUSCULAR
  Filled 2022-05-26: qty 1

## 2022-05-26 MED ORDER — ASPIRIN 81 MG PO CHEW
81.0000 mg | CHEWABLE_TABLET | Freq: Once | ORAL | Status: AC
  Administered 2022-05-26: 81 mg via ORAL
  Filled 2022-05-26: qty 1

## 2022-05-26 MED ORDER — ASPIRIN 81 MG PO TBEC: 81.0000 mg | DELAYED_RELEASE_TABLET | Freq: Every day | ORAL | 0 refills | Status: AC

## 2022-05-26 MED ORDER — METOPROLOL SUCCINATE ER 25 MG PO TB24: 12.5000 mg | ORAL_TABLET | Freq: Every day | ORAL | 0 refills | Status: DC

## 2022-05-26 MED ORDER — ATORVASTATIN CALCIUM 20 MG PO TABS
40.0000 mg | ORAL_TABLET | Freq: Every day | ORAL | Status: DC
  Administered 2022-05-26: 40 mg via ORAL
  Filled 2022-05-26: qty 2

## 2022-05-26 MED ORDER — ATORVASTATIN CALCIUM 40 MG PO TABS: 40.0000 mg | ORAL_TABLET | Freq: Every day | ORAL | 1 refills | Status: DC

## 2022-05-26 NOTE — ED Triage Notes (Signed)
Pt sts that he has been having chest pain for the last two days with back pain. Pt denies any OTC meds for pain

## 2022-05-26 NOTE — ED Provider Notes (Signed)
[facility] Provider Note    Event Date/Time   First MD Initiated Contact with Patient 05/26/22 1502     (approximate)   History   Chest Pain   HPI  Dean Boyd is a 42 y.o. male who presents to the emergency department today because of concerns for chest pain.  Started this morning.  Located in his left lower chest.  Was accompanied by some shortness of breath.  Has improved by the time my exam.  The patient says that he was helping his mom move when the pain started.  Does have a history of heart attacks in the past although today's pain does not remind him of the pain he has had in the past with his heart disease.  The patient additionally has complaints of left lower back pain.  This pain has been going on for weeks.  Does occasionally shoot down his leg.  Denies any change to bladder or bowel habits.     Physical Exam   Triage Vital Signs: ED Triage Vitals  Enc Vitals Group     BP 05/26/22 1422 (!) 154/99     Pulse Rate 05/26/22 1422 87     Resp 05/26/22 1422 17     Temp 05/26/22 1422 97.8 F (36.6 C)     Temp Source 05/26/22 1422 Oral     SpO2 05/26/22 1422 98 %     Weight 05/26/22 1421 157 lb (71.2 kg)     Height --      Head Circumference --      Peak Flow --      Pain Score 05/26/22 1421 8     Pain Loc --      Pain Edu? --      Excl. in GC? --     Most recent vital signs: Vitals:   05/26/22 1422  BP: (!) 154/99  Pulse: 87  Resp: 17  Temp: 97.8 F (36.6 C)  SpO2: 98%   General: Awake, alert, oriented. CV:  Good peripheral perfusion. Regular rate and rhythm. Resp:  Normal effort. Lungs clear. Abd:  No distention. Minimal tenderness in epigastric region. Other:  Tenderness over left SI joint   ED Results / Procedures / Treatments   Labs (all labs ordered are listed, but only abnormal results are displayed) Labs Reviewed  BASIC METABOLIC PANEL - Abnormal; Notable for the following components:      Result Value    Potassium 3.4 (*)    Glucose, Bld 112 (*)    All other components within normal limits  CBC - Abnormal; Notable for the following components:   WBC 12.2 (*)    All other components within normal limits  TROPONIN I (HIGH SENSITIVITY)     EKG  I, Phineas Semen, attending physician, personally viewed and interpreted this EKG  EKG Time: 1424 Rate: 96 Rhythm: normal sinus rhythm Axis: normal Intervals: qtc 429 QRS: narrow ST changes: no st elevation, t wave inversion II, III, aVF, v5, v6 Impression: abnormal ekg    RADIOLOGY I independently interpreted and visualized the CXR. My interpretation: No pneumonia Radiology interpretation:  IMPRESSION:  No active cardiopulmonary disease.      PROCEDURES:  Critical Care performed: No   MEDICATIONS ORDERED IN ED: Medications - No data to display   IMPRESSION / MDM / ASSESSMENT AND PLAN / ED COURSE  I reviewed the triage vital signs and the nursing notes.  Differential diagnosis includes, but is not limited to, ACS, pneumonia, esophagitis, costochondritis.   Patient's presentation is most consistent with acute presentation with potential threat to life or bodily function.  Patient presented to the emergency department today because of concerns for chest pain.  Patient states he does have a history of cardiac disease.  Chest x-ray without pneumonia or pneumothorax.  Initial troponin negative.  Did asked that second troponin be obtained however patient left prior to result.  This time unclear etiology.  Will give patient prescription for home medications.  FINAL CLINICAL IMPRESSION(S) / ED DIAGNOSES   Final diagnoses:  Nonspecific chest pain      Note:  This document was prepared using Dragon voice recognition software and may include unintentional dictation errors.    Phineas Semen, MD 05/26/22 [phone]

## 2022-05-26 NOTE — Discharge Instructions (Addendum)
Please seek medical attention for any high fevers, chest pain, shortness of breath, change in behavior, persistent vomiting, bloody stool or any other new or concerning symptoms.  

## 2022-10-04 ENCOUNTER — Other Ambulatory Visit: Payer: Self-pay

## 2022-10-04 ENCOUNTER — Emergency Department
Admission: EM | Admit: 2022-10-04 | Discharge: 2022-10-04 | Disposition: A | Discharge status: Home / Self Care | Payer: Medicaid Other | Attending: Emergency Medicine | Admitting: Emergency Medicine

## 2022-10-04 ENCOUNTER — Emergency Department: Payer: Medicaid Other

## 2022-10-04 DIAGNOSIS — R079 Chest pain, unspecified: Secondary | ICD-10-CM | POA: Diagnosis not present

## 2022-10-04 DIAGNOSIS — F17210 Nicotine dependence, cigarettes, uncomplicated: Secondary | ICD-10-CM | POA: Diagnosis not present

## 2022-10-04 DIAGNOSIS — M545 Low back pain, unspecified: Secondary | ICD-10-CM | POA: Insufficient documentation

## 2022-10-04 DIAGNOSIS — I2510 Atherosclerotic heart disease of native coronary artery without angina pectoris: Secondary | ICD-10-CM | POA: Insufficient documentation

## 2022-10-04 DIAGNOSIS — R0602 Shortness of breath: Secondary | ICD-10-CM | POA: Diagnosis not present

## 2022-10-04 LAB — BASIC METABOLIC PANEL
Anion gap: 10 (ref 5–15)
BUN: 12 mg/dL (ref 6–20)
CO2: 22 mmol/L (ref 22–32)
Calcium: 9 mg/dL (ref 8.9–10.3)
Chloride: 103 mmol/L (ref 98–111)
Creatinine, Ser: 1.08 mg/dL (ref 0.61–1.24)
GFR, Estimated: >60 mL/min (ref >60)
Glucose, Bld: 102 mg/dL — ABNORMAL HIGH (ref 70–99)
Potassium: 3.8 mmol/L (ref 3.5–5.1)
Sodium: 135 mmol/L (ref 135–145)

## 2022-10-04 LAB — CBC
HCT: 45.8 % (ref 39.0–52.0)
Hemoglobin: 14.7 g/dL (ref 13.0–17.0)
MCH: 28.4 pg (ref 26.0–34.0)
MCHC: 32.1 g/dL (ref 30.0–36.0)
MCV: 88.4 fL (ref 80.0–100.0)
Platelets: 256 10*3/uL (ref 150–400)
RBC: 5.18 MIL/uL (ref 4.22–5.81)
RDW: 12.4 % (ref 11.5–15.5)
WBC: 11.4 10*3/uL — ABNORMAL HIGH (ref 4.0–10.5)
nRBC: 0 % (ref 0.0–0.2)

## 2022-10-04 LAB — D-DIMER, QUANTITATIVE: D-Dimer, Quant: <0.27 ug{FEU}/mL (ref 0.00–0.50)

## 2022-10-04 LAB — TROPONIN I (HIGH SENSITIVITY)
Troponin I (High Sensitivity): 6 ng/L (ref <18)
Troponin I (High Sensitivity): 5 ng/L (ref <18)

## 2022-10-04 MED ORDER — METOPROLOL SUCCINATE ER 25 MG PO TB24: 12.5000 mg | ORAL_TABLET | Freq: Every day | ORAL | 2 refills | Status: AC

## 2022-10-04 MED ORDER — TICAGRELOR 90 MG PO TABS: 90.0000 mg | ORAL_TABLET | Freq: Two times a day (BID) | ORAL | 2 refills | Status: AC

## 2022-10-04 MED ORDER — NITROGLYCERIN 0.4 MG SL SUBL: 0.4000 mg | SUBLINGUAL_TABLET | SUBLINGUAL | 3 refills | Status: DC | PRN

## 2022-10-04 MED ORDER — METOPROLOL SUCCINATE ER 25 MG PO TB24: 12.5000 mg | ORAL_TABLET | Freq: Every day | ORAL | 2 refills | Status: DC

## 2022-10-04 MED ORDER — TICAGRELOR 90 MG PO TABS: 90.0000 mg | ORAL_TABLET | Freq: Two times a day (BID) | ORAL | 2 refills | Status: DC

## 2022-10-04 MED ORDER — NITROGLYCERIN 0.4 MG SL SUBL: 0.4000 mg | SUBLINGUAL_TABLET | SUBLINGUAL | 3 refills | Status: AC | PRN

## 2022-10-04 MED ORDER — KETOROLAC TROMETHAMINE 15 MG/ML IJ SOLN
15.0000 mg | Freq: Once | INTRAMUSCULAR | Status: AC
  Administered 2022-10-04: 15 mg via INTRAMUSCULAR
  Filled 2022-10-04: qty 1

## 2022-10-04 MED ORDER — ATORVASTATIN CALCIUM 40 MG PO TABS: 40.0000 mg | ORAL_TABLET | Freq: Every day | ORAL | 2 refills | Status: DC

## 2022-10-04 MED ORDER — ATORVASTATIN CALCIUM 40 MG PO TABS: 40.0000 mg | ORAL_TABLET | Freq: Every day | ORAL | 2 refills | Status: AC

## 2022-10-04 NOTE — Discharge Instructions (Addendum)
Your exam, labs, EKG, and chest ray all normal and reassuring at this time but no signs of a serious cardiac cause for your chest pain.  It likely represents a musculoskeletal muscle strain.  Take OTC ibuprofen as needed for additional pain relief.  Follow-up with your primary provider or return to the ED if needed.  Please go to the following website to schedule new (and existing) patient appointments:   [email or web address]   The following is a list of primary care offices in the area who are accepting new patients at this time.  Please reach out to one of them directly and let them know you would like to schedule an appointment to follow up on an Emergency Department visit, and/or to establish a new primary care provider (PCP).  There are likely other primary care clinics in the are who are accepting new patients, but this is an excellent place to start:  [facility] Lead physician: Dr Shirlee Latch [address] [place], [place] [address] [phone]  [facility] Lead Physician: Dr Alba Cory [address], [place], [place] [address] [phone]  [facility]  Lead Physician: Dr Olevia Perches [address] [place], [place] [address] [phone]  [facility] Lead Physician: Dr Sofie Hartigan [address] [place], [place] [address] [phone]  [facility] Lead Physician: Dr Bari Edward [address] [place], [place], [place] [address] [phone]

## 2022-10-04 NOTE — ED Provider Notes (Signed)
[facility] Provider Note    Event Date/Time   First MD Initiated Contact with Patient 10/04/22 1325     (approximate)   History   Shoulder Pain and Chest Pain   HPI  Dean Boyd is a 42 y.o. male with a past medical history of coronary artery disease, NSTEMI, cigarette smoking who presents today for evaluation of chest pain.  Patient reports that this began a day and a half ago.  He reports that in his left lower chest.  He reports that he noticed it after he was working outside.  He reports that this pain feels different from his previous heart attack.  He feels that he felt mildly short of breath but does not currently.  He does not have any nausea or diaphoresis associated with it is not worsened with exertion.  He also reports that he has low back pain that does not radiate.  He has had this pain for quite some time.  He denies any illicit substance use including cocaine.  He denies personal or family history of PE or DVT.  Patient Active Problem List   Diagnosis Date Noted   NSTEMI (non-ST elevated myocardial infarction) (HCC) 03/30/2021   Coronary artery disease    Smoking    ST elevation myocardial infarction (STEMI) (HCC) 05/23/2017   Polysubstance abuse (HCC) 05/23/2017   Non-ST elevation myocardial infarction (NSTEMI) (HCC)    Chest pain 05/16/2017   Intractable vomiting with nausea 05/12/2016          Physical Exam   Triage Vital Signs: ED Triage Vitals [10/04/22 1144]  Encounter Vitals Group     BP (!) 145/89     Systolic BP Percentile      Diastolic BP Percentile      Pulse Rate (!) 115     Resp 18     Temp 98 F (36.7 C)     Temp Source Oral     SpO2 97 %     Weight 160 lb (72.6 kg)     Height 5\' 7"  (1.702 m)     Head Circumference      Peak Flow      Pain Score 8     Pain Loc      Pain Education      Exclude from Growth Chart     Most recent vital signs: Vitals:   10/04/22 1144  BP: (!) 145/89  Pulse: (!) 115   Resp: 18  Temp: 98 F (36.7 C)  SpO2: 97%    Physical Exam Vitals and nursing note reviewed.  Constitutional:      General: Awake and alert. No acute distress.    Appearance: Normal appearance. The patient is normal weight.  HENT:     Head: Normocephalic and atraumatic.     Mouth: Mucous membranes are moist.  Eyes:     General: PERRL. Normal EOMs        Right eye: No discharge.        Left eye: No discharge.     Conjunctiva/sclera: Conjunctivae normal.  Cardiovascular:     Rate and Rhythm: Normal rate and regular rhythm.     Pulses: Normal pulses.  Easily reproducible tenderness palpation to left anterior chest wall without overlying skin changes. Pulmonary:     Effort: Pulmonary effort is normal. No respiratory distress.     Breath sounds: Normal breath sounds.  Abdominal:     Abdomen is soft. There is no abdominal tenderness. No rebound  or guarding. No distention. Musculoskeletal:        General: No swelling. Normal range of motion.     Cervical back: Normal range of motion and neck supple.  No lower extremity tenderness or swelling. Skin:    General: Skin is warm and dry.     Capillary Refill: Capillary refill takes less than 2 seconds.     Findings: No rash.  Neurological:     Mental Status: The patient is awake and alert.      ED Results / Procedures / Treatments   Labs (all labs ordered are listed, but only abnormal results are displayed) Labs Reviewed  BASIC METABOLIC PANEL - Abnormal; Notable for the following components:      Result Value   Glucose, Bld 102 (*)    All other components within normal limits  CBC - Abnormal; Notable for the following components:   WBC 11.4 (*)    All other components within normal limits  D-DIMER, QUANTITATIVE (NOT AT [facility])  TROPONIN I (HIGH SENSITIVITY)  TROPONIN I (HIGH SENSITIVITY)     EKG     RADIOLOGY I independently reviewed and interpreted imaging and agree with radiologists  findings.     PROCEDURES:  Critical Care performed:   Procedures   MEDICATIONS ORDERED IN ED: Medications  ketorolac (TORADOL) 15 MG/ML injection 15 mg (15 mg Intramuscular Given 10/04/22 1336)     IMPRESSION / MDM / ASSESSMENT AND PLAN / ED COURSE  I reviewed the triage vital signs and the nursing notes.   Differential diagnosis includes, but is not limited to, NSTEMI, chest wall pain, pulmonary embolism, coronary vasospasm.  I reviewed the patient's chart.  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  Chest pain is easily reproducible on exam.  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  He was treated originally with Toradol.  However, he does have significant comorbidities that require further workup. Heart score is 3 (comorbidities, EKG).  Denies any substance use such as cocaine that would cause coronary vasospasms.  Patient presents emergency department tachycardic to 115 though normotensive, afebrile with a normal oxygen saturation of 97% on room air.  EKG was reviewed by attending MD and appears similar to previous.  Chest x-ray reveals no cardiopulmonary abnormality.  Labs are overall reassuring including troponin x 2.  Given his tachycardia, D-dimer was also obtained, though lab reported there was not enough any initial from patient had to be redrawn.  Patient was passed off to the oncoming provider J. Lonia Skinner, PA-C pending D-dimer, with plan for CT angiogram if positive, and discharge home if negative.  His chest pain has resolved entirely after the Toradol.   Patient's presentation is most consistent with acute presentation with potential threat to life or bodily function.  Clinical Course as of 10/04/22 1504  Wed Oct 04, 2022  1443 Patient  reevaluated.  He reports that pain resolved after Toradol [JP]    Clinical Course User Index [JP] Asmara Backs, Herb Grays, PA-C     FINAL CLINICAL IMPRESSION(S) / ED DIAGNOSES   Final diagnoses:  Chest pain, unspecified type     Rx / DC Orders   ED Discharge Orders     None        Note:  This document was prepared using Dragon voice  recognition software and may include unintentional dictation errors.   Keturah Shavers 10/04/22 1505    Minna Antis, MD 10/04/22 1525

## 2022-10-04 NOTE — ED Notes (Signed)
See triage notes. Patient c/o left shoulder and chest pain since yesterday

## 2022-10-04 NOTE — ED Triage Notes (Signed)
Pt sts that he has been having left shoulder/ chest pain since yesterday.

## 2022-10-04 NOTE — ED Provider Notes (Signed)
----------------------------------------- 3:27 PM on 10/04/2022 -----------------------------------------  Blood pressure (!) 145/89, pulse (!) 115, temperature 98 F (36.7 C), temperature source Oral, resp. rate 18, height 5\' 7"  (1.702 m), weight 72.6 kg, SpO2 97%.  Assuming care from Dr. Alvy Beal, PA-C/NP-C.  In short, Dean Boyd is a 42 y.o. male with a chief complaint of Shoulder Pain and Chest Pain .  Refer to the original H&P for additional details.  The current plan of care is to await pending D-dimer and disposition the patient accordingly..  ____________________________________________    ED Results / Procedures / Treatments   Labs (all labs ordered are listed, but only abnormal results are displayed) Labs Reviewed  BASIC METABOLIC PANEL - Abnormal; Notable for the following components:      Result Value   Glucose, Bld 102 (*)    All other components within normal limits  CBC - Abnormal; Notable for the following components:   WBC 11.4 (*)    All other components within normal limits  D-DIMER, QUANTITATIVE (NOT AT [facility])  TROPONIN I (HIGH SENSITIVITY)  TROPONIN I (HIGH SENSITIVITY)     EKG   RADIOLOGY  I personally viewed and evaluated these images as part of my medical decision making, as well as reviewing the written report by the radiologist.  ED Provider Interpretation: No acute chest x-ray findings  DG Chest 2 View  Result Date: 10/04/2022 CLINICAL DATA:  Chest pain radiating into left shoulder. EXAM: CHEST - 2 VIEW COMPARISON:  Radiograph 05/26/2022 FINDINGS: The cardiomediastinal contours are normal. The lungs are clear. Pulmonary vasculature is normal. No consolidation, pleural effusion, or pneumothorax. No acute osseous abnormalities are seen. IMPRESSION: Negative radiographs of the chest. Electronically Signed   By: Narda Rutherford M.D.   On: 10/04/2022 13:24     PROCEDURES:  Critical Care performed: No  Procedures   MEDICATIONS ORDERED  IN ED: Medications  ketorolac (TORADOL) 15 MG/ML injection 15 mg (15 mg Intramuscular Given 10/04/22 1336)     IMPRESSION / MDM / ASSESSMENT AND PLAN / ED COURSE  I reviewed the triage vital signs and the nursing notes.                              Differential diagnosis includes, but is not limited to, ACS, aortic dissection, pulmonary embolism, cardiac tamponade, pneumothorax, pneumonia, pericarditis, myocarditis, GI-related causes including esophagitis/gastritis, and musculoskeletal chest wall pain.     Patient's presentation is most consistent with acute complicated illness / injury requiring diagnostic workup.  Patient's diagnosis is consistent with nonspecific chest pain, likely musculoskeletal etiology.  Patient with a reassuring cardiac workup including troponin that is negative x 2, reassuring labs, and a negative D-dimer.  Chest x-ray without evidence of intrathoracic process, and EKG without evidence of malignant arrhythmia. Patient will be discharged home with tractions take OTC ibuprofen as needed for pain. Patient is to follow up with [facility] or his primary provider, as needed or otherwise directed. Patient is given ED precautions to return to the ED for any worsening or new symptoms.  Clinical Course as of 10/04/22 1554  Wed Oct 04, 2022  1443 Patient reevaluated.  He reports that pain resolved after Toradol [JP]    Clinical Course User Index [JP] Poggi, Herb Grays, PA-C    FINAL CLINICAL IMPRESSION(S) / ED DIAGNOSES   Final diagnoses:  Chest pain, unspecified type     Rx / DC Orders   ED Discharge Orders  None        Note:  This document was prepared using Dragon voice recognition software and may include unintentional dictation errors.    Lissa Hoard, PA-C 10/04/22 1554    Jene Every, MD 10/04/22 [phone]

## 2022-11-27 ENCOUNTER — Other Ambulatory Visit: Payer: Self-pay

## 2022-11-27 ENCOUNTER — Encounter: Payer: Self-pay | Admitting: Emergency Medicine

## 2022-11-27 ENCOUNTER — Emergency Department
Admission: EM | Admit: 2022-11-27 | Discharge: 2022-11-27 | Disposition: A | Discharge status: Home / Self Care | Payer: Medicaid Other | Attending: Emergency Medicine | Admitting: Emergency Medicine

## 2022-11-27 DIAGNOSIS — X58XXXA Exposure to other specified factors, initial encounter: Secondary | ICD-10-CM | POA: Insufficient documentation

## 2022-11-27 DIAGNOSIS — S39012A Strain of muscle, fascia and tendon of lower back, initial encounter: Secondary | ICD-10-CM | POA: Insufficient documentation

## 2022-11-27 DIAGNOSIS — A539 Syphilis, unspecified: Secondary | ICD-10-CM | POA: Insufficient documentation

## 2022-11-27 DIAGNOSIS — S3992XA Unspecified injury of lower back, initial encounter: Secondary | ICD-10-CM | POA: Diagnosis present

## 2022-11-27 LAB — CHLAMYDIA/NGC RT PCR (ARMC ONLY)
Chlamydia Tr: NOT DETECTED
N gonorrhoeae: NOT DETECTED

## 2022-11-27 LAB — URINALYSIS, ROUTINE W REFLEX MICROSCOPIC
Bilirubin Urine: NEGATIVE
Glucose, UA: NEGATIVE mg/dL
Hgb urine dipstick: NEGATIVE
Ketones, ur: NEGATIVE mg/dL
Leukocytes,Ua: NEGATIVE
Nitrite: NEGATIVE
Protein, ur: NEGATIVE mg/dL
Specific Gravity, Urine: 1 — ABNORMAL LOW (ref 1.005–1.030)
pH: 6 (ref 5.0–8.0)

## 2022-11-27 MED ORDER — PENICILLIN G BENZATHINE 1200000 UNIT/2ML IM SUSY
2.4000 10*6.[IU] | PREFILLED_SYRINGE | Freq: Once | INTRAMUSCULAR | Status: AC
  Administered 2022-11-27: 2.4 10*6.[IU] via INTRAMUSCULAR
  Filled 2022-11-27: qty 4

## 2022-11-27 MED ORDER — LIDOCAINE 5 % EX PTCH
1.0000 | MEDICATED_PATCH | CUTANEOUS | Status: DC
  Administered 2022-11-27: 1 via TRANSDERMAL
  Filled 2022-11-27: qty 1

## 2022-11-27 MED ORDER — KETOROLAC TROMETHAMINE 30 MG/ML IJ SOLN
30.0000 mg | Freq: Once | INTRAMUSCULAR | Status: AC
  Administered 2022-11-27: 30 mg via INTRAMUSCULAR
  Filled 2022-11-27: qty 1

## 2022-11-27 NOTE — Discharge Instructions (Addendum)
You were evaluated in the ED for possible STD exposure.  We will treated you prophylactically with penicillin in the ED.  Please follow-up with [facility] health department for further management and education.

## 2022-11-27 NOTE — ED Provider Notes (Signed)
[facility] Emergency Department Provider Note     Event Date/Time   First MD Initiated Contact with Patient 11/27/22 1059     (approximate)   History   Back Pain and Penis Pain   HPI  Dean Boyd is a 42 y.o. male presents to the ED with complaint of a "sore" on his penis x 4 days with a small amount of clear discharge.  Patient has a history of syphilis and believes this is similar.  Patient endorses sexual activity without barrier contraception use. Patient denies fever and chills, dysuria and hematuria.  Patient also complains of left lower lumbar muscle pain.  Patient reports this pain is unrelated to count penis.  He reports this has been persistent for some months now.  Unknown injury.    Physical Exam   Triage Vital Signs: ED Triage Vitals [11/27/22 1018]  Encounter Vitals Group     BP 120/85     Systolic BP Percentile      Diastolic BP Percentile      Pulse Rate 97     Resp 17     Temp 98.2 F (36.8 C)     Temp Source Oral     SpO2 96 %     Weight 163 lb (73.9 kg)     Height 5\' 7"  (1.702 m)     Head Circumference      Peak Flow      Pain Score 8     Pain Loc      Pain Education      Exclude from Growth Chart     Most recent vital signs: Vitals:   11/27/22 1018  BP: 120/85  Pulse: 97  Resp: 17  Temp: 98.2 F (36.8 C)  SpO2: 96%    General Awake, no distress. Non-toxic HEENT NCAT. PERRL. EOMI. No rhinorrhea. Mucous membranes are moist.  CV:  Good peripheral perfusion.  RESP:  Normal effort.  ABD:  No distention.  Other:  3 irregular pea sized ulcers (2 on dorsal penis shaft, 1 posteriorly).  Tenderness to palpation.   Tenderness palpation along paraspinal muscle of lumbar region.  Full ROM of trunk and hips.  Steady gait   ED Results / Procedures / Treatments   Labs (all labs ordered are listed, but only abnormal results are displayed) Labs Reviewed  URINALYSIS, ROUTINE W REFLEX MICROSCOPIC - Abnormal;  Notable for the following components:      Result Value   Color, Urine COLORLESS (*)    APPearance CLEAR (*)    Specific Gravity, Urine 1.000 (*)    All other components within normal limits  CHLAMYDIA/NGC RT PCR ([facility] ONLY)            RPR   No results found.  PROCEDURES:  Critical Care performed: No  Procedures  MEDICATIONS ORDERED IN ED: Medications  lidocaine (LIDODERM) 5 % 1 patch (1 patch Transdermal Patch Applied 11/27/22 1219)  ketorolac (TORADOL) 30 MG/ML injection 30 mg (30 mg Intramuscular Given 11/27/22 1218)  penicillin g benzathine (BICILLIN LA) 1200000 UNIT/2ML injection 2.4 Million Units (2.4 Million Units Intramuscular Given 11/27/22 1219)    IMPRESSION / MDM / ASSESSMENT AND PLAN / ED COURSE  I reviewed the triage vital signs and the nursing notes.                               42 y.o. male presents to the emergency department  for evaluation and treatment of possible syphilis and STD screening. See HPI for further details.   Differential diagnosis includes, but is not limited to syphilis, chancroid, chlamydia, gonorrhea, UTI  Patient's presentation is most consistent with acute complicated illness / injury requiring diagnostic workup.  Patient is alert and oriented.  He is afebrile and hemodynamically stable.  Urinalysis is reassuring.  Chlamydia and gonorrhea is not detected.  RPR is pending.  Given patient's history of having syphilis patient is prophylactically treated with penicillin in ED.  He is encouraged to follow-up with [facility] health department for further management education.  Patient is in stable condition for discharge home.  ED precautions discussed.   FINAL CLINICAL IMPRESSION(S) / ED DIAGNOSES   Final diagnoses:  Syphilis  Strain of lumbar region, initial encounter    Rx / DC Orders   ED Discharge Orders     None       Note:  This document was prepared using Dragon voice recognition software and may include unintentional  dictation errors.    Romeo Apple, Richards Pherigo A, PA-C 11/27/22 1605    Phineas Semen, MD 11/28/22 [phone]

## 2022-11-27 NOTE — ED Triage Notes (Signed)
Pt in via POV, reports "sore" to penis x approximately 4 days, states it does have a discharge coming from the sore.  Also reports some lower back pain, states this is separate and has been ongoing x a couple months.  Denies any recent injury.

## 2022-11-27 NOTE — ED Notes (Signed)
See triage note  Presents with some penial discharge and sore on his penis  He also has had some back pain w/o injury

## 2022-11-28 LAB — RPR
RPR Ser Ql: REACTIVE — AB
RPR Titer: 1:16 {titer}

## 2022-11-29 LAB — T.PALLIDUM AB, TOTAL: T Pallidum Abs: REACTIVE — AB

## 2022-12-01 NOTE — ED Notes (Signed)
Pt called to inform of positive RPR results. Pt is aware and understand. Will return or follow up with PCP for continued or worsening symptoms.

## 2022-12-08 NOTE — Plan of Care (Signed)
 [facility] Tonsillectomy/Adenoidectomy, Postoperative PEDS care plan entered in error.

## 2023-01-03 ENCOUNTER — Other Ambulatory Visit: Payer: Self-pay

## 2023-01-03 ENCOUNTER — Emergency Department
Admission: EM | Admit: 2023-01-03 | Discharge: 2023-01-03 | Disposition: A | Discharge status: Home / Self Care | Payer: Medicaid Other | Attending: Emergency Medicine | Admitting: Emergency Medicine

## 2023-01-03 ENCOUNTER — Encounter: Payer: Self-pay | Admitting: Emergency Medicine

## 2023-01-03 ENCOUNTER — Emergency Department: Payer: Medicaid Other

## 2023-01-03 DIAGNOSIS — R0602 Shortness of breath: Secondary | ICD-10-CM | POA: Diagnosis not present

## 2023-01-03 DIAGNOSIS — R079 Chest pain, unspecified: Secondary | ICD-10-CM | POA: Insufficient documentation

## 2023-01-03 LAB — HEPATIC FUNCTION PANEL
ALT: 22 U/L (ref 0–44)
AST: 28 U/L (ref 15–41)
Albumin: 4.3 g/dL (ref 3.5–5.0)
Alkaline Phosphatase: 52 U/L (ref 38–126)
Bilirubin, Direct: <0.1 mg/dL (ref 0.0–0.2)
Total Bilirubin: 0.9 mg/dL (ref <1.2)
Total Protein: 7.6 g/dL (ref 6.5–8.1)

## 2023-01-03 LAB — CBC
HCT: 46.2 % (ref 39.0–52.0)
Hemoglobin: 14.7 g/dL (ref 13.0–17.0)
MCH: 28.3 pg (ref 26.0–34.0)
MCHC: 31.8 g/dL (ref 30.0–36.0)
MCV: 88.8 fL (ref 80.0–100.0)
Platelets: 251 10*3/uL (ref 150–400)
RBC: 5.2 MIL/uL (ref 4.22–5.81)
RDW: 13 % (ref 11.5–15.5)
WBC: 10.3 10*3/uL (ref 4.0–10.5)
nRBC: 0 % (ref 0.0–0.2)

## 2023-01-03 LAB — BASIC METABOLIC PANEL
Anion gap: 10 (ref 5–15)
BUN: 11 mg/dL (ref 6–20)
CO2: 21 mmol/L — ABNORMAL LOW (ref 22–32)
Calcium: 9.1 mg/dL (ref 8.9–10.3)
Chloride: 104 mmol/L (ref 98–111)
Creatinine, Ser: 1.12 mg/dL (ref 0.61–1.24)
GFR, Estimated: >60 mL/min (ref >60)
Glucose, Bld: 127 mg/dL — ABNORMAL HIGH (ref 70–99)
Potassium: 3.8 mmol/L (ref 3.5–5.1)
Sodium: 135 mmol/L (ref 135–145)

## 2023-01-03 LAB — TROPONIN I (HIGH SENSITIVITY)
Troponin I (High Sensitivity): 7 ng/L (ref <18)
Troponin I (High Sensitivity): 8 ng/L (ref <18)

## 2023-01-03 LAB — PROTIME-INR
INR: 0.9 (ref 0.8–1.2)
Prothrombin Time: 12.4 s (ref 11.4–15.2)

## 2023-01-03 LAB — LIPASE, BLOOD: Lipase: 46 U/L (ref 11–51)

## 2023-01-03 MED ORDER — KETOROLAC TROMETHAMINE 30 MG/ML IJ SOLN
30.0000 mg | Freq: Once | INTRAMUSCULAR | Status: AC
  Administered 2023-01-03: 30 mg via INTRAMUSCULAR
  Filled 2023-01-03: qty 1

## 2023-01-03 NOTE — ED Provider Notes (Signed)
[facility] Provider Note    Event Date/Time   First MD Initiated Contact with Patient 01/03/23 1121     (approximate)   History   Chest Pain   HPI  Dean Boyd is a 42 y.o. male who presents to the emergency department today because of concerns for chest and back pain.  The patient states that he has had similar pain in the past.  He says this episode has been going on for the past few days.  He denies any unusual exertion or activity prior to the pain starting.  He does occasionally get shortness of breath with this pain.  He denies any fevers.  The patient does have history of heart disease but states he has not seen a cardiologist in a number of years.   Physical Exam   Triage Vital Signs: ED Triage Vitals  Encounter Vitals Group     BP 01/03/23 0921 121/81     Systolic BP Percentile --      Diastolic BP Percentile --      Pulse Rate 01/03/23 0921 (!) 102     Resp 01/03/23 0921 18     Temp 01/03/23 0921 98.8 F (37.1 C)     Temp Source 01/03/23 0921 Oral     SpO2 01/03/23 0921 99 %     Weight 01/03/23 0921 163 lb (73.9 kg)     Height 01/03/23 0921 5\' 7"  (1.702 m)     Head Circumference --      Peak Flow --      Pain Score 01/03/23 0920 9     Pain Loc --      Pain Education --      Exclude from Growth Chart --     Most recent vital signs: Vitals:   01/03/23 0921  BP: 121/81  Pulse: (!) 102  Resp: 18  Temp: 98.8 F (37.1 C)  SpO2: 99%    General: Awake, alert, oriented. CV:  Good peripheral perfusion. Regular rate and rhythm. Resp:  Normal effort. Lungs clear. Abd:  No distention.  Other:  Tender to palpation to upper back bilaterally.   ED Results / Procedures / Treatments   Labs (all labs ordered are listed, but only abnormal results are displayed) Labs Reviewed  BASIC METABOLIC PANEL - Abnormal; Notable for the following components:      Result Value   CO2 21 (*)    Glucose, Bld 127 (*)    All other components  within normal limits  CBC  PROTIME-INR  HEPATIC FUNCTION PANEL  LIPASE, BLOOD  TROPONIN I (HIGH SENSITIVITY)  TROPONIN I (HIGH SENSITIVITY)     EKG  I, Phineas Semen, attending physician, personally viewed and interpreted this EKG  EKG Time: 0923 Rate: 96 Rhythm: normal sinus rhythm Axis: normal Intervals: qtc 424 QRS: narrow, q waves II, III ST changes: no st elevation Impression: abnormal ekg   RADIOLOGY I independently interpreted and visualized the CXR. My interpretation: No pneumonia Radiology interpretation:  IMPRESSION:  No active cardiopulmonary disease.      PROCEDURES:  Critical Care performed: No   MEDICATIONS ORDERED IN ED: Medications - No data to display   IMPRESSION / MDM / ASSESSMENT AND PLAN / ED COURSE  I reviewed the triage vital signs and the nursing notes.                              Differential diagnosis includes, but  is not limited to, ACS, PNA, pneumothorax, MSK  Patient's presentation is most consistent with acute presentation with potential threat to life or bodily function.   Patient presented to the emergency department today because of concerns for chest pain that radiated to his back.  On exam patient is tender to palpation of his back.  Blood work with negative troponins x 2.  EKG without any ST elevation.  At this time I do think likely musculoskeletal cause of the pain given tenderness to palpation.  Think it is reasonable for patient to be discharged.  Will give patient cardiology follow-up information.     FINAL CLINICAL IMPRESSION(S) / ED DIAGNOSES   Final diagnoses:  Nonspecific chest pain     Note:  This document was prepared using Dragon voice recognition software and may include unintentional dictation errors.    Phineas Semen, MD 01/03/23 [phone]

## 2023-01-03 NOTE — Discharge Instructions (Signed)
Please seek medical attention for any high fevers, chest pain, shortness of breath, change in behavior, persistent vomiting, bloody stool or any other new or concerning symptoms.  

## 2023-01-03 NOTE — ED Triage Notes (Signed)
Patient to ED via POV fro centralized chest pain x3 days. States pain radiates into back and left shoulder. Hx for 4 MI's.

## 2023-01-03 NOTE — ED Notes (Signed)
EMT made two attempts to collect blood on pt with no success. Pt extremely frustrated. Lab called to collect troponin lab on pt.

## 2023-02-12 ENCOUNTER — Ambulatory Visit: Payer: Medicaid Other | Admitting: Nurse Practitioner

## 2023-02-12 NOTE — Progress Notes (Deleted)
There were no vitals taken for this visit.   Subjective:    Patient ID: Dean Boyd, male    DOB: Nov 15, 1980, 42 y.o.   MRN: [id number]  HPI: Dean Boyd is a 42 y.o. male  No chief complaint on file.   Discussed the use of AI scribe software for clinical note transcription with the patient, who gave verbal consent to proceed.  History of Present Illness            No data to display          Relevant past medical, surgical, family and social history reviewed and updated as indicated. Interim medical history since our last visit reviewed. Allergies and medications reviewed and updated.  Review of Systems  Per HPI unless specifically indicated above     Objective:    There were no vitals taken for this visit.  {Vitals History (Optional):23777} Wt Readings from Last 3 Encounters:  01/03/23 163 lb (73.9 kg)  11/27/22 163 lb (73.9 kg)  10/04/22 160 lb (72.6 kg)    Physical Exam  Results for orders placed or performed during the hospital encounter of 01/03/23  Basic metabolic panel   Collection Time: 01/03/23  9:22 AM  Result Value Ref Range   Sodium 135 135 - 145 mmol/L   Potassium 3.8 3.5 - 5.1 mmol/L   Chloride 104 98 - 111 mmol/L   CO2 21 (L) 22 - 32 mmol/L   Glucose, Bld 127 (H) 70 - 99 mg/dL   BUN 11 6 - 20 mg/dL   Creatinine, Ser 6.64 0.61 - 1.24 mg/dL   Calcium 9.1 8.9 - 40.3 mg/dL   GFR, Estimated >47 >42 mL/min   Anion gap 10 5 - 15  CBC   Collection Time: 01/03/23  9:22 AM  Result Value Ref Range   WBC 10.3 4.0 - 10.5 K/uL   RBC 5.20 4.22 - 5.81 MIL/uL   Hemoglobin 14.7 13.0 - 17.0 g/dL   HCT 59.5 63.8 - 75.6 %   MCV 88.8 80.0 - 100.0 fL   MCH 28.3 26.0 - 34.0 pg   MCHC 31.8 30.0 - 36.0 g/dL   RDW 43.3 29.5 - 18.8 %   Platelets 251 150 - 400 K/uL   nRBC 0.0 0.0 - 0.2 %  Protime-INR (order if Patient is taking Coumadin / Warfarin)   Collection Time: 01/03/23  9:22 AM  Result Value Ref Range   Prothrombin Time 12.4 11.4 - 15.2  seconds   INR 0.9 0.8 - 1.2  Troponin I (High Sensitivity)   Collection Time: 01/03/23  9:22 AM  Result Value Ref Range   Troponin I (High Sensitivity) 7 <18 ng/L  Hepatic function panel   Collection Time: 01/03/23  9:23 AM  Result Value Ref Range   Total Protein 7.6 6.5 - 8.1 g/dL   Albumin 4.3 3.5 - 5.0 g/dL   AST 28 15 - 41 U/L   ALT 22 0 - 44 U/L   Alkaline Phosphatase 52 38 - 126 U/L   Total Bilirubin 0.9 <1.2 mg/dL   Bilirubin, Direct <4.1 0.0 - 0.2 mg/dL   Indirect Bilirubin NOT CALCULATED 0.3 - 0.9 mg/dL  Lipase, blood   Collection Time: 01/03/23  9:23 AM  Result Value Ref Range   Lipase 46 11 - 51 U/L  Troponin I (High Sensitivity)   Collection Time: 01/03/23 12:44 PM  Result Value Ref Range   Troponin I (High Sensitivity) 8 <18 ng/L   {Labs (Optional):23779}  Assessment & Plan:   Problem List Items Addressed This Visit   None    Assessment and Plan             Follow up plan: No follow-ups on file.
